# Patient Record
Sex: Male | Born: 1935
Health system: Southern US, Community
[De-identification: ages and names within clinical notes are randomized; demographics above are authoritative.]

## PROBLEM LIST (undated history)

## (undated) DIAGNOSIS — F0390 Unspecified dementia without behavioral disturbance: Secondary | ICD-10-CM

## (undated) DIAGNOSIS — I6529 Occlusion and stenosis of unspecified carotid artery: Secondary | ICD-10-CM

## (undated) DIAGNOSIS — E7800 Pure hypercholesterolemia, unspecified: Secondary | ICD-10-CM

## (undated) DIAGNOSIS — I10 Essential (primary) hypertension: Secondary | ICD-10-CM

## (undated) DIAGNOSIS — I639 Cerebral infarction, unspecified: Secondary | ICD-10-CM

## (undated) DIAGNOSIS — C801 Malignant (primary) neoplasm, unspecified: Secondary | ICD-10-CM

## (undated) DIAGNOSIS — N3941 Urge incontinence: Secondary | ICD-10-CM

## (undated) HISTORY — DX: Cerebral infarction, unspecified: I63.9

## (undated) HISTORY — DX: Occlusion and stenosis of unspecified carotid artery: I65.29

## (undated) HISTORY — DX: Unspecified dementia, unspecified severity, without behavioral disturbance, psychotic disturbance, mood disturbance, and anxiety: F03.90

## (undated) HISTORY — DX: Malignant (primary) neoplasm, unspecified: C80.1

---

## 1998-02-25 ENCOUNTER — Ambulatory Visit (HOSPITAL_COMMUNITY): Admission: RE | Admit: 1998-02-25 | Discharge: 1998-02-25 | Payer: Self-pay | Admitting: Surgery

## 1999-03-10 ENCOUNTER — Ambulatory Visit (HOSPITAL_COMMUNITY): Admission: RE | Admit: 1999-03-10 | Discharge: 1999-03-10 | Payer: Self-pay | Admitting: Surgery

## 2000-02-29 ENCOUNTER — Ambulatory Visit (HOSPITAL_COMMUNITY): Admission: RE | Admit: 2000-02-29 | Discharge: 2000-02-29 | Payer: Self-pay | Admitting: Surgery

## 2001-02-26 ENCOUNTER — Ambulatory Visit (HOSPITAL_COMMUNITY): Admission: RE | Admit: 2001-02-26 | Discharge: 2001-02-26 | Payer: Self-pay | Admitting: Surgery

## 2001-03-14 DIAGNOSIS — I639 Cerebral infarction, unspecified: Secondary | ICD-10-CM

## 2001-03-14 HISTORY — DX: Cerebral infarction, unspecified: I63.9

## 2002-01-15 ENCOUNTER — Other Ambulatory Visit: Admission: RE | Admit: 2002-01-15 | Discharge: 2002-01-15 | Payer: Self-pay | Admitting: Urology

## 2003-03-25 ENCOUNTER — Other Ambulatory Visit: Admission: RE | Admit: 2003-03-25 | Discharge: 2003-03-25 | Payer: Self-pay | Admitting: Urology

## 2003-04-22 ENCOUNTER — Ambulatory Visit (HOSPITAL_COMMUNITY): Admission: RE | Admit: 2003-04-22 | Discharge: 2003-04-22 | Payer: Self-pay | Admitting: Surgery

## 2003-05-01 ENCOUNTER — Ambulatory Visit: Admission: RE | Admit: 2003-05-01 | Discharge: 2003-07-30 | Payer: Self-pay | Admitting: Radiation Oncology

## 2005-02-18 ENCOUNTER — Emergency Department (HOSPITAL_COMMUNITY): Admission: EM | Admit: 2005-02-18 | Discharge: 2005-02-19 | Payer: Self-pay | Admitting: *Deleted

## 2007-03-15 DIAGNOSIS — C801 Malignant (primary) neoplasm, unspecified: Secondary | ICD-10-CM

## 2007-03-15 HISTORY — DX: Malignant (primary) neoplasm, unspecified: C80.1

## 2007-03-21 ENCOUNTER — Ambulatory Visit: Payer: Self-pay | Admitting: Vascular Surgery

## 2008-03-26 ENCOUNTER — Ambulatory Visit: Payer: Self-pay | Admitting: Vascular Surgery

## 2009-04-01 ENCOUNTER — Ambulatory Visit: Payer: Self-pay | Admitting: Vascular Surgery

## 2009-06-30 ENCOUNTER — Ambulatory Visit (HOSPITAL_COMMUNITY): Admission: RE | Admit: 2009-06-30 | Discharge: 2009-06-30 | Payer: Self-pay | Admitting: Pulmonary Disease

## 2009-07-14 ENCOUNTER — Encounter (HOSPITAL_COMMUNITY): Admission: RE | Admit: 2009-07-14 | Discharge: 2009-08-13 | Payer: Self-pay | Admitting: Pulmonary Disease

## 2010-04-01 ENCOUNTER — Ambulatory Visit: Admit: 2010-04-01 | Payer: Self-pay | Admitting: Vascular Surgery

## 2010-07-27 NOTE — Assessment & Plan Note (Signed)
OFFICE VISIT   Eduardo Vasquez, Eduardo Vasquez  DOB:  01/09/1930                                       03/21/2007  UUVOZ#:[id number]   Patient returns for followup today for mild carotid stenosis.  He was  last seen in January, 2008.  He previously had a left eye field cut, but  the symptoms have been unchanged now for greater than two years.  He  denies any other new symptoms of TIA, stroke, or amaurosis fugax.  His  atherosclerotic risk factors continue to remain hypertension and  elevated cholesterol.  He is on amlodipine, doxazosin, and simvastatin  to control these.  He has his cholesterol checked every six months.  He  takes his blood pressure daily in both arms.  He states that usually his  systolic blood pressure runs in the 130s.   PHYSICAL EXAMINATION:  Blood pressure is 162/82 in the right arm.  Pulse  is 98 and regular.  He has 2+ carotid pulses with no bruit.  Chest is  clear to auscultation.   Patient continues to take 1 aspirin daily.  He has had no problems with  this.  He will follow up again with me in one year's time with a repeat  carotid duplex at that time.  If he has had no progression of his  carotid stenosis, we will continue to follow him with a duplex scan  every other year and an office visit yearly.   Janetta Hora. Fields, MD  Electronically Signed   CEF/MEDQ  D:  03/21/2007  T:  03/22/2007  Job:  [id number]   cc:   Ramon Dredge L. Juanetta Gosling, M.D.

## 2010-07-27 NOTE — Procedures (Signed)
CAROTID DUPLEX EXAM   INDICATION:  Followup carotid artery disease.   HISTORY:  Diabetes:  No.  Cardiac:  No.  Hypertension:  Yes.  Smoking:  No.  Previous Surgery:  No.  CV History:  Stroke approximately 10 years ago with left eye upper  field cut which still persists today.  Paresthesias No, Hemiparesis No                                       RIGHT             LEFT  Brachial systolic pressure:         130               132  Brachial Doppler waveforms:         WNL               WNL  Vertebral direction of flow:        Antegrade         Antegrade  DUPLEX VELOCITIES (cm/sec)  CCA peak systolic                   119               140  ECA peak systolic                   165               148  ICA peak systolic                   87                103  ICA end diastolic                   25                19  PLAQUE MORPHOLOGY:                  Calcific          Calcific  PLAQUE AMOUNT:                      Mild              Mild  PLAQUE LOCATION:                    ICA / bifurcation ICA / bifurcation   IMPRESSION:  1. Bilateral internal carotid arteries show evidence of 1%-39%      stenosis.  2. No significant changes from previous study.         ___________________________________________  Janetta Hora Fields, MD   AS/MEDQ  D:  04/01/2009  T:  04/01/2009  Job:  [id number]

## 2010-07-27 NOTE — Procedures (Signed)
CAROTID DUPLEX EXAM   INDICATION:  Follow-up evaluation of known carotid artery disease.   HISTORY:  Diabetes:  No.  Cardiac:  No.  Hypertension:  Medically controlled.  Smoking:  No.  Previous Surgery:  No.  CV History:  Patient had a stroke characterized by left eye amaurosis  fugax.  Previous duplex on 03/24/06 revealed 20-39% ICA stenosis  bilaterally.  Amaurosis Fugax Yes, Paresthesias No, Hemiparesis No.                                       RIGHT             LEFT  Brachial systolic pressure:         148               148  Brachial Doppler waveforms:         Triphasic         Triphasic  Vertebral direction of flow:        Antegrade         Antegrade  DUPLEX VELOCITIES (cm/sec)  CCA peak systolic                   104               89  ECA peak systolic                   101               127  ICA peak systolic                   61                115  ICA end diastolic                   24                37  PLAQUE MORPHOLOGY:                  Mixed             Mixed  PLAQUE AMOUNT:                      Mild              Mild  PLAQUE LOCATION:                    Proximal ICA      Proximal ICA   IMPRESSION:  1. 20-39% internal carotid artery stenosis bilaterally.  2. No significant change from previous study performed on 03/24/06.   ___________________________________________  Janetta Hora. Fields, MD   MC/MEDQ  D:  03/26/2008  T:  03/26/2008  Job:  [id number]

## 2011-04-05 DIAGNOSIS — D075 Carcinoma in situ of prostate: Secondary | ICD-10-CM | POA: Diagnosis not present

## 2011-04-05 DIAGNOSIS — I10 Essential (primary) hypertension: Secondary | ICD-10-CM | POA: Diagnosis not present

## 2011-04-05 DIAGNOSIS — E785 Hyperlipidemia, unspecified: Secondary | ICD-10-CM | POA: Diagnosis not present

## 2011-04-20 DIAGNOSIS — H4011X0 Primary open-angle glaucoma, stage unspecified: Secondary | ICD-10-CM | POA: Diagnosis not present

## 2011-05-13 DIAGNOSIS — E785 Hyperlipidemia, unspecified: Secondary | ICD-10-CM | POA: Diagnosis not present

## 2011-05-23 ENCOUNTER — Other Ambulatory Visit (HOSPITAL_COMMUNITY): Payer: Self-pay | Admitting: Pulmonary Disease

## 2011-05-23 DIAGNOSIS — I10 Essential (primary) hypertension: Secondary | ICD-10-CM | POA: Diagnosis not present

## 2011-05-23 DIAGNOSIS — N189 Chronic kidney disease, unspecified: Secondary | ICD-10-CM

## 2011-05-23 DIAGNOSIS — E785 Hyperlipidemia, unspecified: Secondary | ICD-10-CM | POA: Diagnosis not present

## 2011-05-23 DIAGNOSIS — N19 Unspecified kidney failure: Secondary | ICD-10-CM | POA: Diagnosis not present

## 2011-05-24 ENCOUNTER — Ambulatory Visit (HOSPITAL_COMMUNITY)
Admission: RE | Admit: 2011-05-24 | Discharge: 2011-05-24 | Disposition: A | Discharge status: Home / Self Care | Payer: Medicare Other | Source: Ambulatory Visit | Attending: Pulmonary Disease | Admitting: Pulmonary Disease

## 2011-05-24 DIAGNOSIS — Q619 Cystic kidney disease, unspecified: Secondary | ICD-10-CM | POA: Insufficient documentation

## 2011-05-24 DIAGNOSIS — N400 Enlarged prostate without lower urinary tract symptoms: Secondary | ICD-10-CM | POA: Insufficient documentation

## 2011-05-24 DIAGNOSIS — N189 Chronic kidney disease, unspecified: Secondary | ICD-10-CM | POA: Insufficient documentation

## 2011-05-24 DIAGNOSIS — N281 Cyst of kidney, acquired: Secondary | ICD-10-CM | POA: Diagnosis not present

## 2011-06-14 DIAGNOSIS — I10 Essential (primary) hypertension: Secondary | ICD-10-CM | POA: Diagnosis not present

## 2011-06-14 DIAGNOSIS — E785 Hyperlipidemia, unspecified: Secondary | ICD-10-CM | POA: Diagnosis not present

## 2011-06-14 DIAGNOSIS — I6789 Other cerebrovascular disease: Secondary | ICD-10-CM | POA: Diagnosis not present

## 2011-06-14 DIAGNOSIS — R05 Cough: Secondary | ICD-10-CM | POA: Diagnosis not present

## 2011-07-25 DIAGNOSIS — H4011X0 Primary open-angle glaucoma, stage unspecified: Secondary | ICD-10-CM | POA: Diagnosis not present

## 2011-08-05 DIAGNOSIS — E785 Hyperlipidemia, unspecified: Secondary | ICD-10-CM | POA: Diagnosis not present

## 2011-08-05 DIAGNOSIS — I10 Essential (primary) hypertension: Secondary | ICD-10-CM | POA: Diagnosis not present

## 2011-08-15 DIAGNOSIS — I6789 Other cerebrovascular disease: Secondary | ICD-10-CM | POA: Diagnosis not present

## 2011-08-15 DIAGNOSIS — I10 Essential (primary) hypertension: Secondary | ICD-10-CM | POA: Diagnosis not present

## 2011-08-15 DIAGNOSIS — E785 Hyperlipidemia, unspecified: Secondary | ICD-10-CM | POA: Diagnosis not present

## 2011-08-15 DIAGNOSIS — R05 Cough: Secondary | ICD-10-CM | POA: Diagnosis not present

## 2011-10-05 ENCOUNTER — Encounter: Payer: Self-pay | Admitting: Vascular Surgery

## 2011-10-26 DIAGNOSIS — H4011X0 Primary open-angle glaucoma, stage unspecified: Secondary | ICD-10-CM | POA: Diagnosis not present

## 2011-10-28 ENCOUNTER — Other Ambulatory Visit: Payer: Self-pay | Admitting: *Deleted

## 2011-10-28 DIAGNOSIS — I6529 Occlusion and stenosis of unspecified carotid artery: Secondary | ICD-10-CM

## 2011-11-02 ENCOUNTER — Encounter: Payer: Self-pay | Admitting: Neurosurgery

## 2011-11-03 ENCOUNTER — Other Ambulatory Visit (INDEPENDENT_AMBULATORY_CARE_PROVIDER_SITE_OTHER): Payer: Medicare Other | Admitting: *Deleted

## 2011-11-03 ENCOUNTER — Encounter: Payer: Self-pay | Admitting: Neurosurgery

## 2011-11-03 ENCOUNTER — Ambulatory Visit (INDEPENDENT_AMBULATORY_CARE_PROVIDER_SITE_OTHER): Payer: Medicare Other | Admitting: Neurosurgery

## 2011-11-03 VITALS — BP 127/68 | HR 70 | Resp 14 | Ht 73.0 in | Wt 178.1 lb

## 2011-11-03 DIAGNOSIS — I6529 Occlusion and stenosis of unspecified carotid artery: Secondary | ICD-10-CM | POA: Insufficient documentation

## 2011-11-03 NOTE — Progress Notes (Signed)
[facility] [place] Carotid Office Note  CC: Annual carotid surveillance Referring Physician: Fields  History of Present Illness: 78 year old male patient of Dr. Darrick Penna followed for mild bilateral carotid stenosis. The patient denies signs or symptoms of CVA, TIA, amaurosis fugax or any neural deficit. The patient denies any new medical diagnoses or recent surgery.  No past medical history on file.  ROS: [x]  Positive   [ ]  Denies    General: [ ]  Weight loss, [ ]  Fever, [ ]  chills Neurologic: [ ]  Dizziness, [ ]  Blackouts, [ ]  Seizure [ ]  Stroke, [ ]  "Mini stroke", [ ]  Slurred speech, [ ]  Temporary blindness; [ ]  weakness in arms or legs, [ ]  Hoarseness Cardiac: [ ]  Chest pain/pressure, [ ]  Shortness of breath at rest [ ]  Shortness of breath with exertion, [ ]  Atrial fibrillation or irregular heartbeat Vascular: [ ]  Pain in legs with walking, [ ]  Pain in legs at rest, [ ]  Pain in legs at night,  [ ]  Non-healing ulcer, [ ]  Blood clot in vein/DVT,   Pulmonary: [ ]  Home oxygen, [ ]  Productive cough, [ ]  Coughing up blood, [ ]  Asthma,  [ ]  Wheezing Musculoskeletal:  [ ]  Arthritis, [ ]  Low back pain, [ ]  Joint pain Hematologic: [ ]  Easy Bruising, [ ]  Anemia; [ ]  Hepatitis Gastrointestinal: [ ]  Blood in stool, [ ]  Gastroesophageal Reflux/heartburn, [ ]  Trouble swallowing Urinary: [ ]  chronic Kidney disease, [ ]  on HD - [ ]  MWF or [ ]  TTHS, [ ]  Burning with urination, [ ]  Difficulty urinating Skin: [ ]  Rashes, [ ]  Wounds Psychological: [ ]  Anxiety, [ ]  Depression   Social History History  Substance Use Topics  . Smoking status: Never Smoker   . Smokeless tobacco: Not on file  . Alcohol Use: Yes    Family History No family history on file.  Allergies not on file  No current outpatient prescriptions on file.    Physical Examination  Filed Vitals:   11/03/11 1435  BP: 130/70  Pulse: 72  Resp: 14    Body mass index is 23.50 kg/(m^2).  General:  WDWN in  NAD Gait: Normal HEENT: WNL Eyes: Pupils equal Pulmonary: normal non-labored breathing , without Rales, rhonchi,  wheezing Cardiac: RRR, without  Murmurs, rubs or gallops; Abdomen: soft, NT, no masses Skin: no rashes, ulcers noted  Vascular Exam Pulses: 3+ radial pulses bilaterally Carotid bruits: Carotid pulses to auscultation no bruits are heard Extremities without ischemic changes, no Gangrene , no cellulitis; no open wounds;  Musculoskeletal: no muscle wasting or atrophy   Neurologic: A&O X 3; Appropriate Affect ; SENSATION: normal; MOTOR FUNCTION:  moving all extremities equally. Speech is fluent/normal  Non-Invasive Vascular Imaging CAROTID DUPLEX 11/03/2011  Right ICA 20 - 39 % stenosis Left ICA 20 - 39 % stenosis   ASSESSMENT/PLAN: Asymptomatic patient with mild bilateral carotid stenosis unchanged from previous exam. This will followup in one year with repeat carotid duplex. The patient's questions were encouraged and answered, he is in agreement with this plan.  Lauree Chandler ANP   Clinic MD: Darrick Penna

## 2011-11-04 ENCOUNTER — Encounter: Payer: Self-pay | Admitting: Neurosurgery

## 2011-12-08 DIAGNOSIS — Z23 Encounter for immunization: Secondary | ICD-10-CM | POA: Diagnosis not present

## 2011-12-19 DIAGNOSIS — R05 Cough: Secondary | ICD-10-CM | POA: Diagnosis not present

## 2011-12-19 DIAGNOSIS — I10 Essential (primary) hypertension: Secondary | ICD-10-CM | POA: Diagnosis not present

## 2011-12-19 DIAGNOSIS — E785 Hyperlipidemia, unspecified: Secondary | ICD-10-CM | POA: Diagnosis not present

## 2011-12-19 DIAGNOSIS — N19 Unspecified kidney failure: Secondary | ICD-10-CM | POA: Diagnosis not present

## 2011-12-20 DIAGNOSIS — N19 Unspecified kidney failure: Secondary | ICD-10-CM | POA: Diagnosis not present

## 2011-12-20 DIAGNOSIS — I10 Essential (primary) hypertension: Secondary | ICD-10-CM | POA: Diagnosis not present

## 2011-12-20 DIAGNOSIS — I739 Peripheral vascular disease, unspecified: Secondary | ICD-10-CM | POA: Diagnosis not present

## 2011-12-20 DIAGNOSIS — E785 Hyperlipidemia, unspecified: Secondary | ICD-10-CM | POA: Diagnosis not present

## 2011-12-28 ENCOUNTER — Other Ambulatory Visit: Payer: Self-pay | Admitting: *Deleted

## 2011-12-28 DIAGNOSIS — I6529 Occlusion and stenosis of unspecified carotid artery: Secondary | ICD-10-CM

## 2012-01-18 DIAGNOSIS — C61 Malignant neoplasm of prostate: Secondary | ICD-10-CM | POA: Diagnosis not present

## 2012-01-25 DIAGNOSIS — C61 Malignant neoplasm of prostate: Secondary | ICD-10-CM | POA: Diagnosis not present

## 2012-02-22 DIAGNOSIS — H2510 Age-related nuclear cataract, unspecified eye: Secondary | ICD-10-CM | POA: Diagnosis not present

## 2012-02-22 DIAGNOSIS — H4011X0 Primary open-angle glaucoma, stage unspecified: Secondary | ICD-10-CM | POA: Diagnosis not present

## 2012-03-23 IMAGING — US US RENAL
1 series · 14 of 25 positions shown · non-contrast
Comparison: Abdomen pelvis CT 02/19/2005

CLINICAL DATA: Chronic renal failure.

RENAL/URINARY TRACT ULTRASOUND COMPLETE

[Series 1: us renal · 0.26mm/px · 14 of 30 slices shown]
[im 1/30]
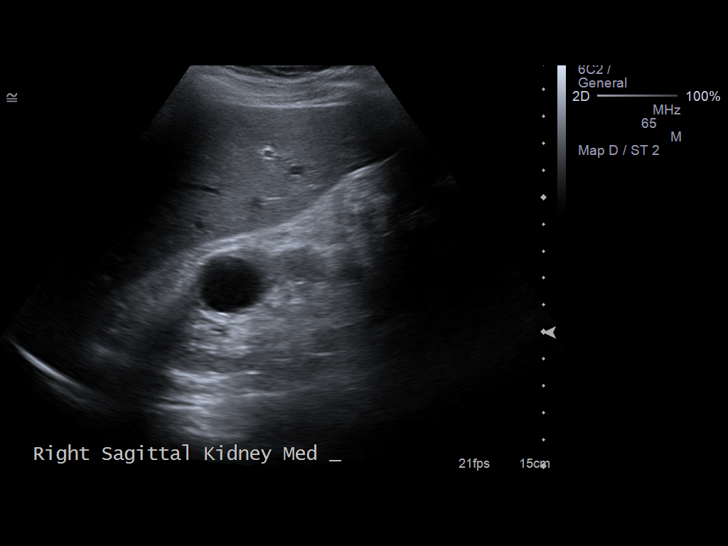
[im 3/30]
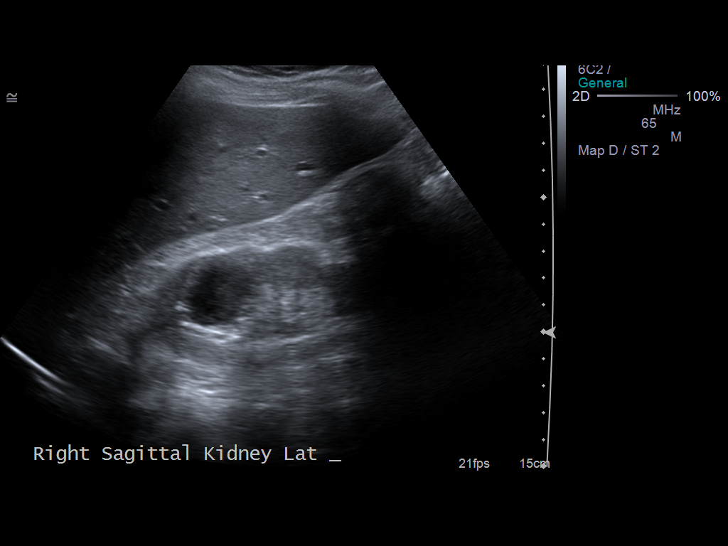
[im 5/30]
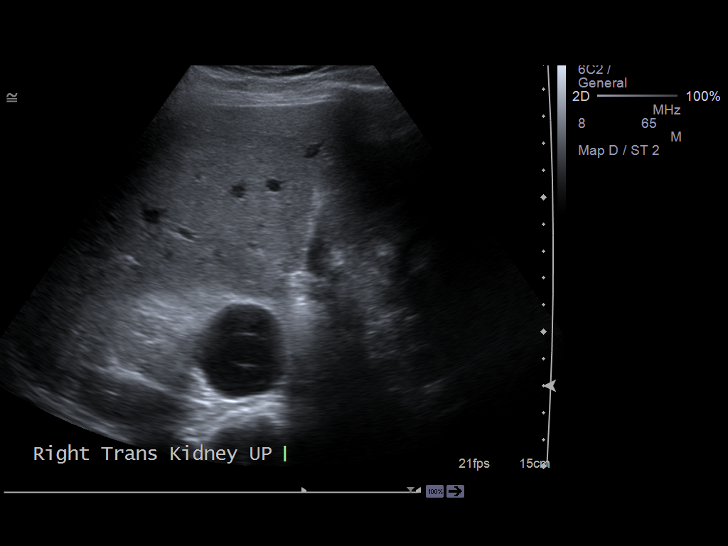
[im 8/30]
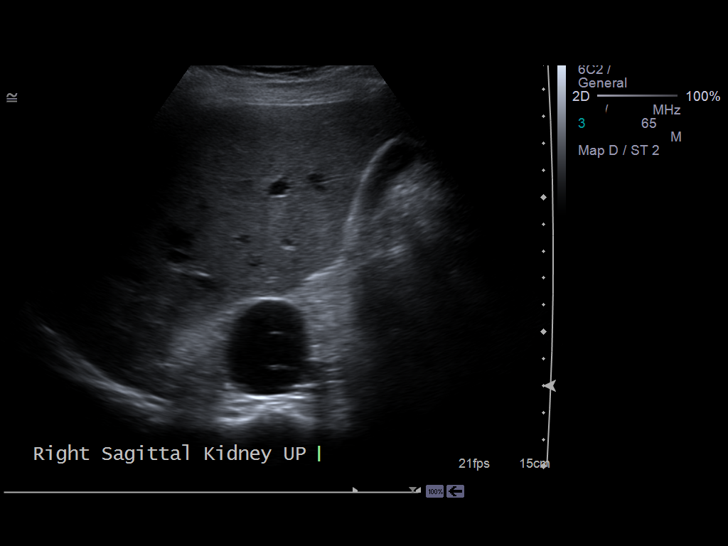
[im 10/30]
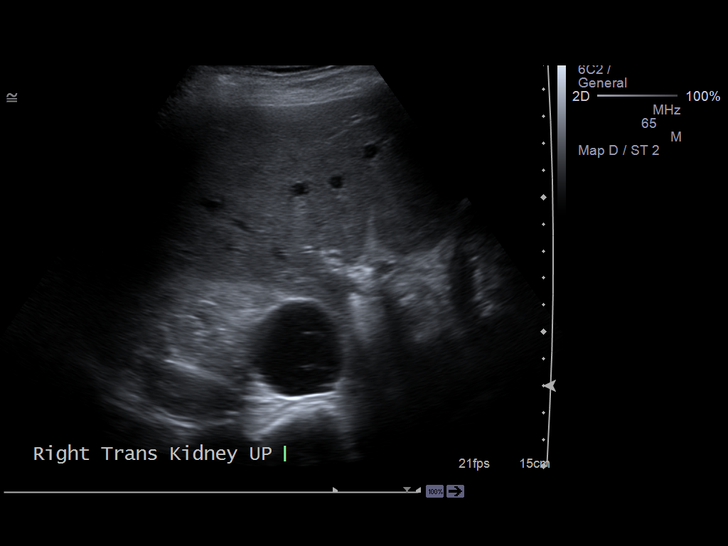
[im 11/30]
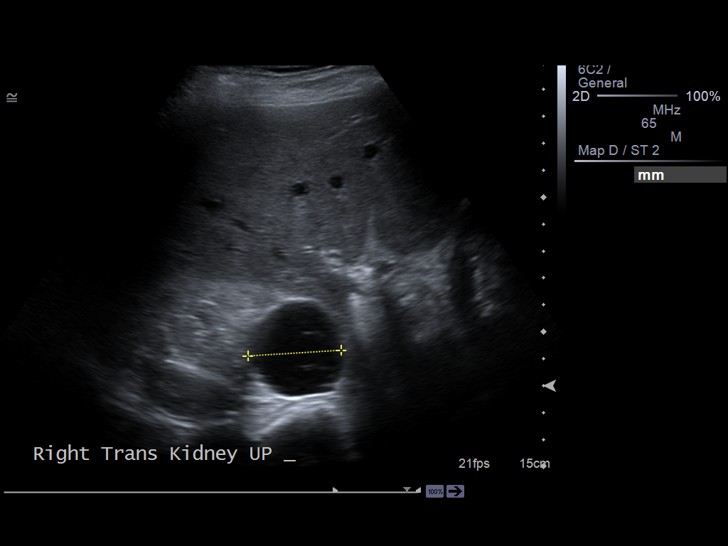
[im 14/30]
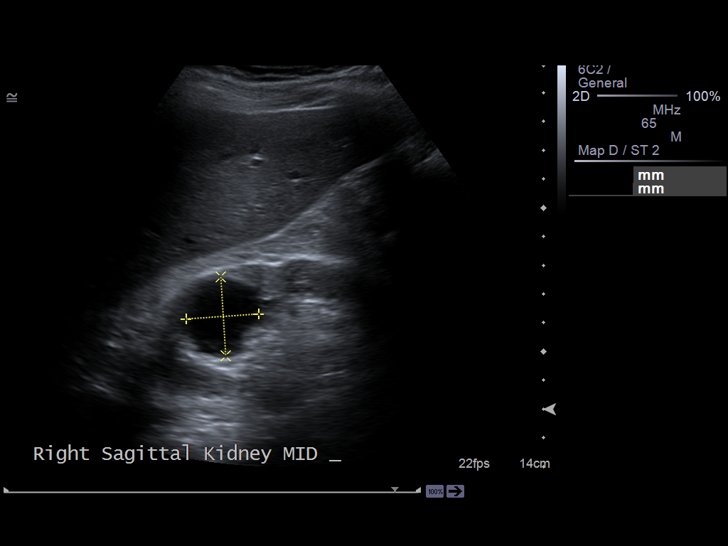
[im 16/30]
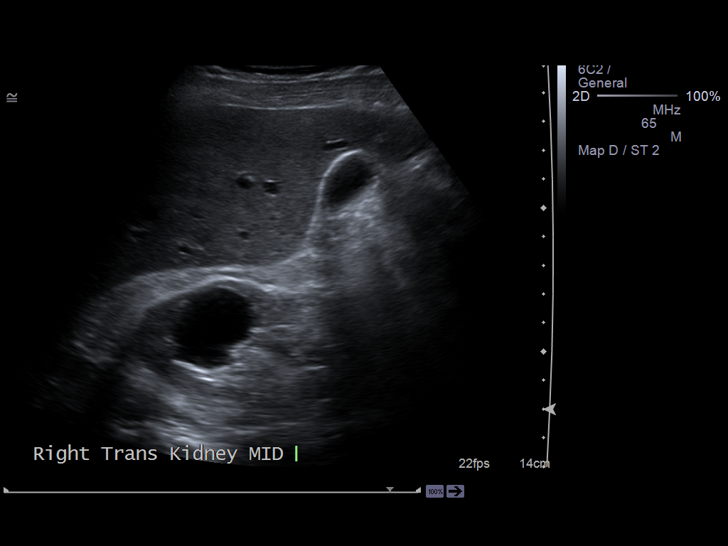
[im 19/30]
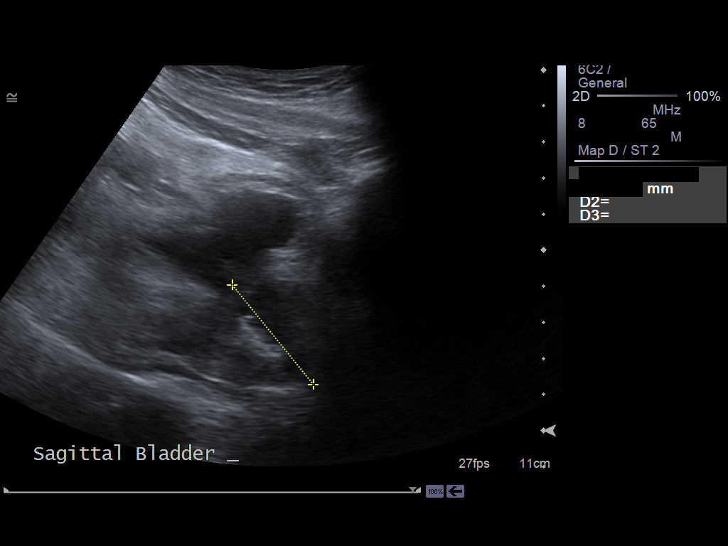
[im 20/30]
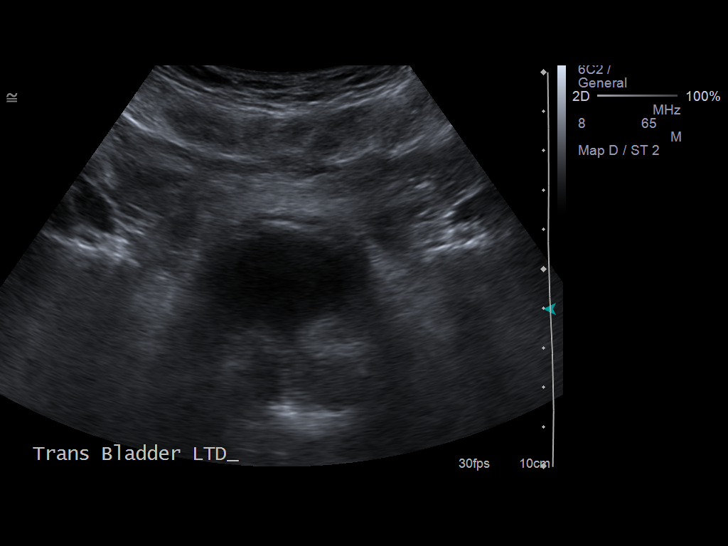
[im 22/30]
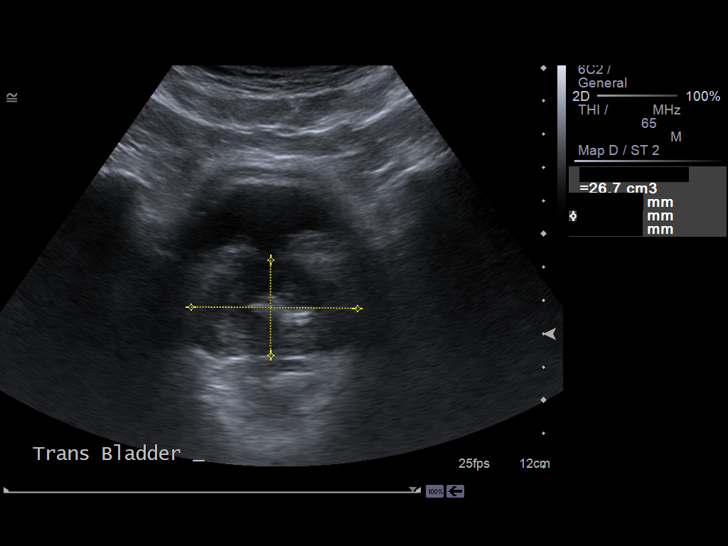
[im 25/30]
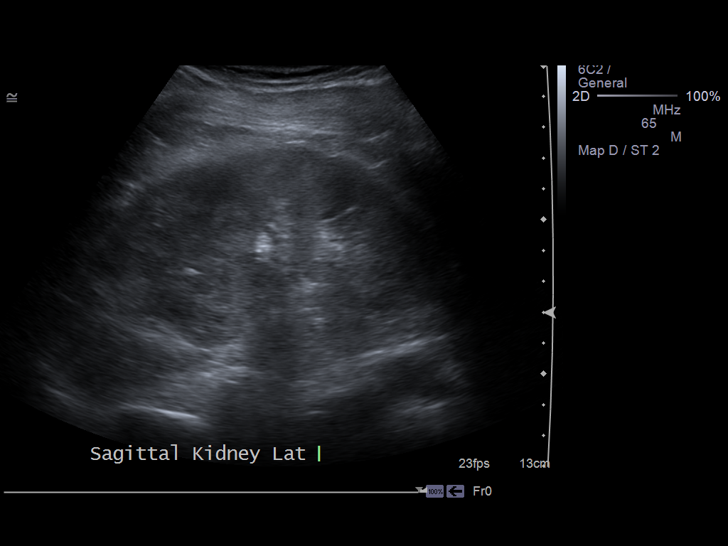
[im 27/30]
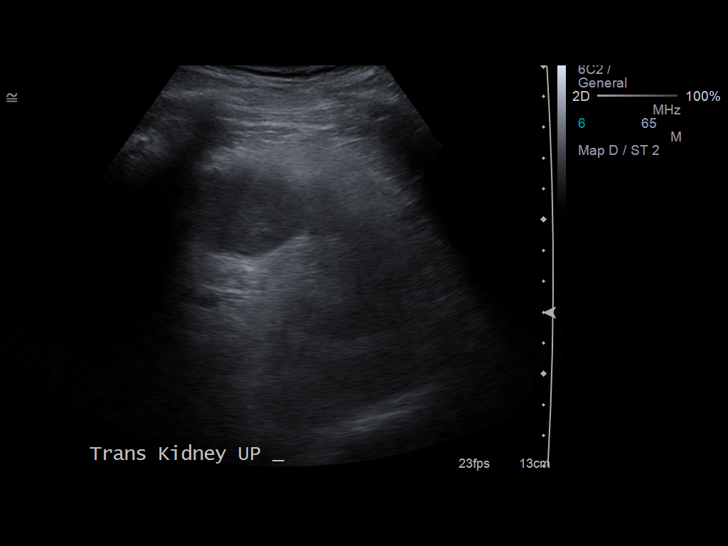
[im 30/30]
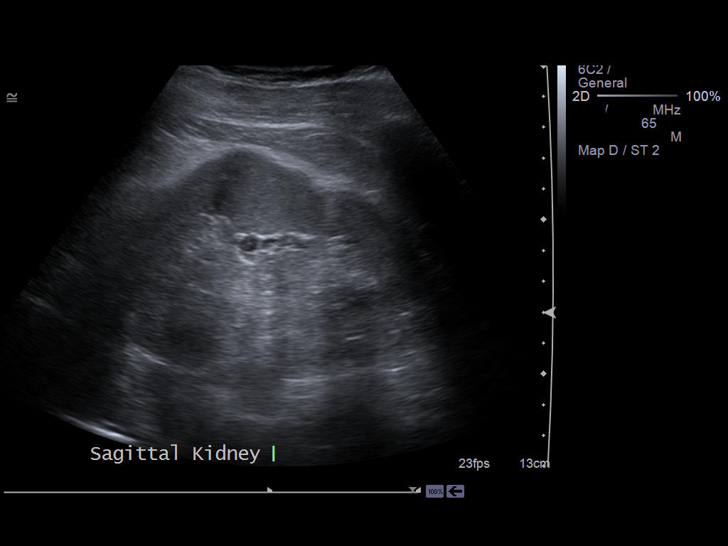

[14 of 25 positions shown; findings below may reference images not displayed]

FINDINGS: Right Kidney:  Measures 10.3 cm in length.  Echogenicity of the
right kidney is slightly increased compared to the adjacent liver.
There are two simple/benign cysts identified in the right kidney.
Extending from the upper pole is a 3.0 x 3.5 x 3.4 cm cyst.  This
contains two very thin incomplete septations centrally.  In the mid
pole right kidney is a 2.6 x 2.5 x 2.8 cm simple cyst.  There is no
hydronephrosis.

Left Kidney:  Measures 11.7 cm in length.  Echogenicity of the
renal cortex appears slightly increased.

Bladder:  Urinary bladder is nearly completely decompressed,
limiting its evaluation.

Prostate gland: Appears prominent by ultrasound.  Estimated to
measure 3.6 x 2.9 x 5.0 cm (calculated volume of 27 cm cubed).
IMPRESSION: 1.  Mildly increased echogenicity of both kidneys.  This can be
seen with chronic medical renal disease.
2.  Two simple/benign cysts in the right kidney.  No imaging follow-
up is felt to be necessary.
3.  Negative for hydronephrosis.
4.  Prostamegaly.

## 2012-06-14 DIAGNOSIS — N19 Unspecified kidney failure: Secondary | ICD-10-CM | POA: Diagnosis not present

## 2012-06-14 DIAGNOSIS — I10 Essential (primary) hypertension: Secondary | ICD-10-CM | POA: Diagnosis not present

## 2012-06-14 DIAGNOSIS — H4011X0 Primary open-angle glaucoma, stage unspecified: Secondary | ICD-10-CM | POA: Diagnosis not present

## 2012-06-14 DIAGNOSIS — E785 Hyperlipidemia, unspecified: Secondary | ICD-10-CM | POA: Diagnosis not present

## 2012-06-14 DIAGNOSIS — I6789 Other cerebrovascular disease: Secondary | ICD-10-CM | POA: Diagnosis not present

## 2012-08-01 DIAGNOSIS — C61 Malignant neoplasm of prostate: Secondary | ICD-10-CM | POA: Diagnosis not present

## 2012-09-11 DIAGNOSIS — C61 Malignant neoplasm of prostate: Secondary | ICD-10-CM | POA: Diagnosis not present

## 2012-10-18 DIAGNOSIS — H4011X0 Primary open-angle glaucoma, stage unspecified: Secondary | ICD-10-CM | POA: Diagnosis not present

## 2012-10-31 ENCOUNTER — Encounter: Payer: Self-pay | Admitting: Family

## 2012-11-01 ENCOUNTER — Encounter: Payer: Self-pay | Admitting: Family

## 2012-11-01 ENCOUNTER — Other Ambulatory Visit (INDEPENDENT_AMBULATORY_CARE_PROVIDER_SITE_OTHER): Payer: Medicare Other | Admitting: *Deleted

## 2012-11-01 ENCOUNTER — Ambulatory Visit (INDEPENDENT_AMBULATORY_CARE_PROVIDER_SITE_OTHER): Payer: Medicare Other | Admitting: Family

## 2012-11-01 DIAGNOSIS — I6529 Occlusion and stenosis of unspecified carotid artery: Secondary | ICD-10-CM

## 2012-11-01 NOTE — Progress Notes (Signed)
Established Carotid Patient   History of Present Illness  Eduardo Vasquez is a 79 y.o. male who has known carotid stenosis with a history of stroke in 2003, his only symptom was left eye partial upper visual field loss and this deficit remains.   Patient has not had previous CEA.   The patient  denies facial drooping.  Pt. denies hemiplegia.  The patient denies receptive or expressive aphasia.  Pt. denies weakness.  The patient's previous neurologic deficits are Unchanged.  Patient denies new Medical or Surgical History.  Pt Diabetic: No Pt smoker: No  Pt meds include: Statin : Yes Betablocker: No ASA: Yes Other anticoagulants/antiplatelets: no   Past Medical History  Diagnosis Date  . Carotid artery occlusion   . Cancer 2009    Prostate  . Stroke 2003    Mini - Eye    Social History History  Substance Use Topics  . Smoking status: Never Smoker   . Smokeless tobacco: Never Used  . Alcohol Use: Yes    Family History Family History  Problem Relation Age of Onset  . Hypertension Mother   . Hypertension Father   . Diabetes Daughter     Surgical History History reviewed. No pertinent past surgical history.  Not on File  Current Outpatient Prescriptions  Medication Sig Dispense Refill  . AMLODIPINE BESYLATE PO Take 10 mg by mouth daily.      Marland Kitchen aspirin 325 MG tablet Take 325 mg by mouth daily.      Marland Kitchen doxazosin (CARDURA) 8 MG tablet Take 8 mg by mouth at bedtime.      . Multiple Vitamin (MULTIVITAMIN) tablet Take 1 tablet by mouth daily.      . pravastatin (PRAVACHOL) 80 MG tablet Take 80 mg by mouth daily.      . Tamsulosin HCl (FLOMAX) 0.4 MG CAPS Take 0.4 mg by mouth daily.       No current facility-administered medications for this visit.    Review of Systems : [x]  Positive   [ ]  Denies  General:[ ]  Weight loss,  [ ]  Weight gain, [ ]  Loss of appetite, [ ]  Fever, [ ]  chills  Neurologic: [ ]  Dizziness, [ ]  Blackouts, [ ]  Headaches, [ ]  Seizure [ ]   Stroke, [ ]  "Mini stroke", [ ]  Slurred speech, [ ]  Temporary blindness;  [ ] weakness,  Ear/Nose/Throat: [ ]  Change in hearing, [ ]  Nose bleeds, [ ]  Hoarseness  Vascular:[ ]  Pain in legs with walking, [ ]  Pain in feet while lying flat , [ ]   Non-healing ulcer, [ ]  Blood clot in vein,    Pulmonary: [ ]  Home oxygen, [ ]   Productive cough, [ ]  Bronchitis, [ ]  Coughing up blood,  [ ]  Asthma, [ ]  Wheezing  Musculoskeletal:  [ ]  Arthritis, [ ]  Joint pain, [ ]  low back pain  Cardiac: [ ]  Chest pain, [ ]  Shortness of breath when lying flat, [ ]  Shortness of breath with exertion, [ ]  Palpitations, [ ]  Heart murmur, [ ]   Atrial fibrillation  Hematologic:[ ]  Easy Bruising, [ ]  Anemia; [ ]  Hepatitis  Psychiatric: [ ]   Depression, [ ]  Anxiety   Gastrointestinal: [ ]  Black stool, [ ]  Blood in stool, [ ]  Peptic ulcer disease,  [ ]  Gastroesophageal Reflux, [ ]  Trouble swallowing, [ ]  Diarrhea, [ ]  Constipation  Urinary: [ ]  chronic Kidney disease, [ ]  on HD, [ ]  Burning with urination, [ ]  Frequent urination, [ ]  Difficulty urinating;  Skin: [ ]  Rashes, [ ]  Wounds    Physical Examination  Filed Vitals:   11/01/12 1630  BP: 160/82  Pulse: 62  Resp:   Body mass index is 22.83 kg/(m^2).   General: WDWN male in NAD GAIT: normal Eyes: PERRLA Pulmonary:  CTAB, Negative  Rales, Negative rhonchi, & Negative wheezing,  Cardiac: regular Rhythm , no peripheral edema  Vascular:      Vessel Right Left  Radial Palpable Palpable  Carotid audible, without bruit audible, without bruit  Aorta Not palpable N/A  Popliteal  palpable  palpable  PT Palpable Palpable  DP Palpable Palpable    Gastrointestinal: soft, nontender, BS WNL, no r/g,  negative masses;   Musculoskeletal: Negative muscle atrophy/wasting. M/S 5/5 throughout, Extremities without ischemic changes.  Neurologic: A&O X 3; Appropriate Affect ; SENSATION ;normal; MOTOR FUNCTION: normal 5/5 strength in all tested muscle groups. Speech is  normal CN 2-12 intact, Pain and light touch intact in extremities, Motor exam as listed above .  Non-Invasive Vascular Imaging CAROTID DUPLEX 11/01/2012   Less than 40% stenosis ICA bilaterally, unchanged from previous Duplex.  Assessment: Eduardo Vasquez is a 79 y.o. male who remains asymptomatic since his 2003 stroke that affected only the upper visual field in his left eye and this visual field loss remains. His carotid Duplex results remain the same as last year: less than 40% internal carotid stenosis bilaterally.  Plan: Follow-up in 1 year with Carotid Duplex scan.   I discussed in depth with the patient the nature of atherosclerosis, and emphasized the importance of maximal medical management including strict control of blood pressure, blood glucose, and lipid levels, obtaining regular exercise, and cessation of smoking.  The patient is aware that without maximal medical management the underlying atherosclerotic disease process will progress, limiting the benefit of any interventions. Pt was given verbal information regarding stroke symptoms and prevention. He knows to return sooner if needed. Thank you for allowing [place] to participate in this patient's care.  Charisse March, RN, MSN, FNP-C [facility] [place] Office: [phone]  Clinic Physician: Darrick Penna  11/01/2012 4:55 PM

## 2012-11-22 ENCOUNTER — Other Ambulatory Visit: Payer: Self-pay | Admitting: *Deleted

## 2012-11-28 DIAGNOSIS — Z23 Encounter for immunization: Secondary | ICD-10-CM | POA: Diagnosis not present

## 2012-12-25 DIAGNOSIS — N19 Unspecified kidney failure: Secondary | ICD-10-CM | POA: Diagnosis not present

## 2012-12-25 DIAGNOSIS — E785 Hyperlipidemia, unspecified: Secondary | ICD-10-CM | POA: Diagnosis not present

## 2012-12-25 DIAGNOSIS — I10 Essential (primary) hypertension: Secondary | ICD-10-CM | POA: Diagnosis not present

## 2012-12-25 DIAGNOSIS — I6789 Other cerebrovascular disease: Secondary | ICD-10-CM | POA: Diagnosis not present

## 2012-12-26 DIAGNOSIS — E785 Hyperlipidemia, unspecified: Secondary | ICD-10-CM | POA: Diagnosis not present

## 2012-12-26 DIAGNOSIS — N19 Unspecified kidney failure: Secondary | ICD-10-CM | POA: Diagnosis not present

## 2012-12-26 DIAGNOSIS — F528 Other sexual dysfunction not due to a substance or known physiological condition: Secondary | ICD-10-CM | POA: Diagnosis not present

## 2012-12-26 DIAGNOSIS — I6789 Other cerebrovascular disease: Secondary | ICD-10-CM | POA: Diagnosis not present

## 2012-12-26 DIAGNOSIS — C61 Malignant neoplasm of prostate: Secondary | ICD-10-CM | POA: Diagnosis not present

## 2012-12-26 DIAGNOSIS — I10 Essential (primary) hypertension: Secondary | ICD-10-CM | POA: Diagnosis not present

## 2013-02-04 DIAGNOSIS — I10 Essential (primary) hypertension: Secondary | ICD-10-CM | POA: Diagnosis not present

## 2013-02-04 DIAGNOSIS — K4090 Unilateral inguinal hernia, without obstruction or gangrene, not specified as recurrent: Secondary | ICD-10-CM | POA: Diagnosis not present

## 2013-02-18 DIAGNOSIS — H4011X0 Primary open-angle glaucoma, stage unspecified: Secondary | ICD-10-CM | POA: Diagnosis not present

## 2013-02-18 DIAGNOSIS — H2510 Age-related nuclear cataract, unspecified eye: Secondary | ICD-10-CM | POA: Diagnosis not present

## 2013-05-02 ENCOUNTER — Other Ambulatory Visit: Payer: Self-pay | Admitting: Family

## 2013-05-02 DIAGNOSIS — I6529 Occlusion and stenosis of unspecified carotid artery: Secondary | ICD-10-CM

## 2013-06-12 DIAGNOSIS — I10 Essential (primary) hypertension: Secondary | ICD-10-CM | POA: Diagnosis not present

## 2013-06-12 DIAGNOSIS — I6789 Other cerebrovascular disease: Secondary | ICD-10-CM | POA: Diagnosis not present

## 2013-06-12 DIAGNOSIS — E785 Hyperlipidemia, unspecified: Secondary | ICD-10-CM | POA: Diagnosis not present

## 2013-06-12 DIAGNOSIS — N19 Unspecified kidney failure: Secondary | ICD-10-CM | POA: Diagnosis not present

## 2013-06-20 DIAGNOSIS — H4011X0 Primary open-angle glaucoma, stage unspecified: Secondary | ICD-10-CM | POA: Diagnosis not present

## 2013-06-20 DIAGNOSIS — H2510 Age-related nuclear cataract, unspecified eye: Secondary | ICD-10-CM | POA: Diagnosis not present

## 2013-07-02 DIAGNOSIS — M25519 Pain in unspecified shoulder: Secondary | ICD-10-CM | POA: Diagnosis not present

## 2013-07-02 DIAGNOSIS — M542 Cervicalgia: Secondary | ICD-10-CM | POA: Diagnosis not present

## 2013-07-09 ENCOUNTER — Ambulatory Visit (INDEPENDENT_AMBULATORY_CARE_PROVIDER_SITE_OTHER): Payer: Medicare Other | Admitting: Urology

## 2013-07-09 DIAGNOSIS — N529 Male erectile dysfunction, unspecified: Secondary | ICD-10-CM | POA: Diagnosis not present

## 2013-07-09 DIAGNOSIS — C61 Malignant neoplasm of prostate: Secondary | ICD-10-CM | POA: Diagnosis not present

## 2013-07-31 DIAGNOSIS — H4011X0 Primary open-angle glaucoma, stage unspecified: Secondary | ICD-10-CM | POA: Diagnosis not present

## 2013-09-03 DIAGNOSIS — H2589 Other age-related cataract: Secondary | ICD-10-CM | POA: Diagnosis not present

## 2013-09-03 DIAGNOSIS — H4011X0 Primary open-angle glaucoma, stage unspecified: Secondary | ICD-10-CM | POA: Diagnosis not present

## 2013-11-06 ENCOUNTER — Encounter: Payer: Self-pay | Admitting: Family

## 2013-11-07 ENCOUNTER — Other Ambulatory Visit: Payer: Medicare Other | Admitting: *Deleted

## 2013-11-07 ENCOUNTER — Ambulatory Visit (INDEPENDENT_AMBULATORY_CARE_PROVIDER_SITE_OTHER): Payer: Medicare Other | Admitting: Family

## 2013-11-07 ENCOUNTER — Encounter: Payer: Self-pay | Admitting: Family

## 2013-11-07 ENCOUNTER — Ambulatory Visit (HOSPITAL_COMMUNITY)
Admission: RE | Admit: 2013-11-07 | Discharge: 2013-11-07 | Disposition: A | Discharge status: Home / Self Care | Payer: Medicare Other | Source: Ambulatory Visit | Attending: Family | Admitting: Family

## 2013-11-07 VITALS — BP 140/81 | HR 58 | Resp 16 | Ht 73.0 in | Wt 177.0 lb

## 2013-11-07 DIAGNOSIS — I658 Occlusion and stenosis of other precerebral arteries: Secondary | ICD-10-CM | POA: Insufficient documentation

## 2013-11-07 DIAGNOSIS — I6529 Occlusion and stenosis of unspecified carotid artery: Secondary | ICD-10-CM | POA: Diagnosis not present

## 2013-11-07 NOTE — Patient Instructions (Signed)
Stroke Prevention Some medical conditions and behaviors are associated with an increased chance of having a stroke. You may prevent a stroke by making healthy choices and managing medical conditions. HOW CAN I REDUCE MY RISK OF HAVING A STROKE?   Stay physically active. Get at least 30 minutes of activity on most or all days.  Do not smoke. It may also be helpful to avoid exposure to secondhand smoke.  Limit alcohol use. Moderate alcohol use is considered to be:  No more than 2 drinks per day for men.  No more than 1 drink per day for nonpregnant women.  Eat healthy foods. This involves:  Eating 5 or more servings of fruits and vegetables a day.  Making dietary changes that address high blood pressure (hypertension), high cholesterol, diabetes, or obesity.  Manage your cholesterol levels.  Making food choices that are high in fiber and low in saturated fat, trans fat, and cholesterol may control cholesterol levels.  Take any prescribed medicines to control cholesterol as directed by your health care provider.  Manage your diabetes.  Controlling your carbohydrate and sugar intake is recommended to manage diabetes.  Take any prescribed medicines to control diabetes as directed by your health care provider.  Control your hypertension.  Making food choices that are low in salt (sodium), saturated fat, trans fat, and cholesterol is recommended to manage hypertension.  Take any prescribed medicines to control hypertension as directed by your health care provider.  Maintain a healthy weight.  Reducing calorie intake and making food choices that are low in sodium, saturated fat, trans fat, and cholesterol are recommended to manage weight.  Stop drug abuse.  Avoid taking birth control pills.  Talk to your health care provider about the risks of taking birth control pills if you are over 35 years old, smoke, get migraines, or have ever had a blood clot.  Get evaluated for sleep  disorders (sleep apnea).  Talk to your health care provider about getting a sleep evaluation if you snore a lot or have excessive sleepiness.  Take medicines only as directed by your health care provider.  For some people, aspirin or blood thinners (anticoagulants) are helpful in reducing the risk of forming abnormal blood clots that can lead to stroke. If you have the irregular heart rhythm of atrial fibrillation, you should be on a blood thinner unless there is a good reason you cannot take them.  Understand all your medicine instructions.  Make sure that other conditions (such as anemia or atherosclerosis) are addressed. SEEK IMMEDIATE MEDICAL CARE IF:   You have sudden weakness or numbness of the face, arm, or leg, especially on one side of the body.  Your face or eyelid droops to one side.  You have sudden confusion.  You have trouble speaking (aphasia) or understanding.  You have sudden trouble seeing in one or both eyes.  You have sudden trouble walking.  You have dizziness.  You have a loss of balance or coordination.  You have a sudden, severe headache with no known cause.  You have new chest pain or an irregular heartbeat. Any of these symptoms may represent a serious problem that is an emergency. Do not wait to see if the symptoms will go away. Get medical help at once. Call your local emergency services (911 in [place] Do not drive yourself to the hospital. Document Released: 04/07/2004 Document Revised: 07/15/2013 Document Reviewed: 08/31/2012 [employer] Patient Information 2015 [employer]. This information is not intended to replace advice given   to you by your health care provider. Make sure you discuss any questions you have with your health care provider.  

## 2013-11-07 NOTE — Progress Notes (Signed)
Established Carotid Patient   History of Present Illness  Eduardo Vasquez is a 80 y.o. male patient of Dr. Oneida Alar who has known carotid stenosis with a history of stroke in 2003, his only symptom was left eye partial upper visual field loss and this deficit remains. He has had no further stroke or TIA symptoms.  He returns today for follow up. Patient has not had previous CEA.  The patient denies facial drooping.  Pt. denies hemiplegia. The patient denies receptive or expressive aphasia. Pt. denies weakness.  The patient's previous neurologic deficits are Unchanged.  He reports that he is scheduled for right inguinal hernia repair on Sept. 1.  He plays a lot of golf, states he has no claudication symptoms with walking.  Pt Diabetic: No  Pt smoker: No   Pt meds include:  Statin : Yes  Betablocker: No  ASA: Yes  Other anticoagulants/antiplatelets: no   Past Medical History  Diagnosis Date  . Carotid artery occlusion   . Cancer 2009    Prostate  . Stroke 2003    Mini - Eye    Social History History  Substance Use Topics  . Smoking status: Never Smoker   . Smokeless tobacco: Never Used  . Alcohol Use: Yes    Family History Family History  Problem Relation Age of Onset  . Hypertension Mother   . Hypertension Father   . Diabetes Daughter     Surgical History No past surgical history on file.  Not on File  Current Outpatient Prescriptions  Medication Sig Dispense Refill  . AMLODIPINE BESYLATE PO Take 10 mg by mouth daily.      Marland Kitchen aspirin 325 MG tablet Take 325 mg by mouth daily.      Marland Kitchen doxazosin (CARDURA) 8 MG tablet Take 8 mg by mouth at bedtime.      . Multiple Vitamin (MULTIVITAMIN) tablet Take 1 tablet by mouth daily.      . pravastatin (PRAVACHOL) 80 MG tablet Take 80 mg by mouth daily.      . Tamsulosin HCl (FLOMAX) 0.4 MG CAPS Take 0.4 mg by mouth daily.       No current facility-administered medications for this visit.    Review of Systems : See HPI  for pertinent positives and negatives.  Physical Examination  Filed Vitals:   11/07/13 1348 11/07/13 1351  BP: 142/73 140/81  Pulse: 61 58  Resp:  16  Height:  6\' 1"  (1.854 m)  Weight:  177 lb (80.287 kg)  SpO2:  100%   Body mass index is 23.36 kg/(m^2).  General: WDWN male in NAD  GAIT: normal  Eyes: PERRLA  Pulmonary: CTAB, Negative Rales, Negative rhonchi, & Negative wheezing,  Cardiac: regular Rhythm , no peripheral edema   Vascular:  Vessel  Right  Left   Radial  Palpable  Palpable   Carotid  audible, without bruit  audible, without bruit   Aorta  Not palpable  N/A   Popliteal  palpable  palpable   PT  Palpable  Palpable   DP  Palpable  Palpable    Gastrointestinal: soft, nontender, BS WNL, no r/g, negative masses;  Musculoskeletal: Negative muscle atrophy/wasting. M/S 5/5 throughout, Extremities without ischemic changes.  Neurologic: A&O X 3; Appropriate Affect ; SENSATION ;normal; MOTOR FUNCTION: normal 5/5 strength in all tested muscle groups.  Speech is normal  CN 2-12 intact, Pain and light touch intact in extremities, Motor exam as listed above   Non-Invasive Vascular Imaging CAROTID DUPLEX 11/07/2013  CEREBROVASCULAR DUPLEX EVALUATION    INDICATION: Carotid artery disease    PREVIOUS INTERVENTION(S): None    DUPLEX EXAM: Carotid duplex    RIGHT  LEFT  Peak Systolic Velocities (cm/s) End Diastolic Velocities (cm/s) Plaque LOCATION Peak Systolic Velocities (cm/s) End Diastolic Velocities (cm/s) Plaque  97 14 - CCA PROXIMAL 94 17 -  113 16 - CCA MID 86 18 -  82 15 - CCA DISTAL 76 15 CP  84 15 - ECA 98 15 HT  39 11 HT ICA PROXIMAL 136 25 CP  68 21 - ICA MID 81 17 -  74 20 - ICA DISTAL 73 22 -    .65 ICA / CCA Ratio (PSV) 1.58  Antegrade Vertebral Flow Antegrade  638 Brachial Systolic Pressure (mmHg) 937  Triphasic Brachial Artery Waveforms Triphasic    Plaque Morphology:  HM = Homogeneous, HT = Heterogeneous, CP = Calcific Plaque, SP = Smooth  Plaque, IP = Irregular Plaque     ADDITIONAL FINDINGS:     IMPRESSION: 1. Less than 40% right internal carotid artery stenosis. 2. Less than 40% left internal carotid artery stenosis, calcific plaque may obscure higher velocity.    Compared to the previous exam:  No change.    Assessment: Eduardo Vasquez is a 80 y.o. male who presents with asymptomatic minimal bilateral ICA stenosis unchanged from a year ago.  Plan: Follow-up in 1 year with Carotid Duplex scan.   I discussed in depth with the patient the nature of atherosclerosis, and emphasized the importance of maximal medical management including strict control of blood pressure, blood glucose, and lipid levels, obtaining regular exercise, and continued cessation of smoking.  The patient is aware that without maximal medical management the underlying atherosclerotic disease process will progress, limiting the benefit of any interventions. The patient was given information about stroke prevention and what symptoms should prompt the patient to seek immediate medical care. Thank you for allowing [place] to participate in this patient's care.  Clemon Chambers, RN, MSN, FNP-C [facility] [place] Office: [place]: Oneida Alar  11/07/2013 1:40 PM

## 2013-11-14 DIAGNOSIS — K4090 Unilateral inguinal hernia, without obstruction or gangrene, not specified as recurrent: Secondary | ICD-10-CM | POA: Diagnosis not present

## 2013-11-15 NOTE — H&P (Signed)
  NTS SOAP Note  Vital Signs:  Vitals as of: 07/19/2618: Systolic 355: Diastolic 74: Heart Rate 81: Temp 97.48F: Height 41ft 1in: Weight 180Lbs 0 Ounces: Pain Level 10: BMI 23.75  BMI : 23.75 kg/m2  Subjective: This 80 year old male presents for of a right inguinal hernia.  Has been present for several months.  Made worse with straining.  Review of Symptoms:  Constitutional:unremarkable   Head:unremarkable Eyes:blurred vision bilateral sinus problems Cardiovascular:  unremarkable Respiratory:unremarkable Gastrointestinal:  unremarkable   Genitourinary:urgency, frequency joint, back pain boils Hematolgic/Lymphatic:unremarkable   Allergic/Immunologic:unremarkable   Past Medical History:  Reviewed  Past Medical History  Surgical History: prostate surgery Medical Problems: prostate cancer,  htn,  chronic kidney disease Allergies: nkda Medications: doxazosin,  pravastatin,  tamsulosin,  amlodipine,  travatan eye drops   Social History:Reviewed  Social History  Preferred Language: English Race:  Black or African American Ethnicity: Not Hispanic / Latino Age: 80 year Marital Status:  S Alcohol: no   Smoking Status: Never smoker reviewed on 11/14/2013 Functional Status reviewed on 11/14/2013 ------------------------------------------------ Bathing: Normal Cooking: Normal Dressing: Normal Driving: Normal Eating: Normal Managing Meds: Normal Oral Care: Normal Shopping: Normal Toileting: Normal Transferring: Normal Walking: Normal Cognitive Status reviewed on 11/14/2013 ------------------------------------------------ Attention: Normal Decision Making: Normal Language: Normal Memory: Normal Motor: Normal Perception: Normal Problem Solving: Normal Visual and Spatial: Normal   Family History:Reviewed  Family Health History Mother, Deceased; Healthy;  Father, Deceased; Healthy;     Objective Information: General:Well appearing,  well nourished in no distress. Neck:Supple without lymphadenopathy.  Heart:RRR, no murmur or gallop.  Normal S1, S2.  No S3, S4.  Lungs:  CTA bilaterally, no wheezes, rhonchi, rales.  Breathing unlabored. Abdomen:Soft, NT/ND, no HSM, no masses.  Reudcible right inguinal hernia.  No left inguinal hernia. HR:[id number]    Assessment:Right inguinal hernia  Diagnoses: 550.90  K40.90 Inguinal hernia (Unilateral inguinal hernia, without obstruction or gangrene, not specified as recurrent)  Procedures: 03212 - OFFICE OUTPATIENT NEW 30 MINUTES    Plan:  Scheduled for right inguinal herniorrhaphy with mesh on 12/02/13.   Patient Education:Alternative treatments to surgery were discussed with patient (and family).  Risks and benefits  of procedure including bleeding,  infection,  mesh use,  and recurrence of the hernia were fully explained to the patient (and family) who gave informed consent. Patient/family questions were addressed.  Follow-up:Pending Surgery

## 2013-11-19 ENCOUNTER — Encounter (HOSPITAL_COMMUNITY): Payer: Self-pay | Admitting: Pharmacy Technician

## 2013-11-26 DIAGNOSIS — Z23 Encounter for immunization: Secondary | ICD-10-CM | POA: Diagnosis not present

## 2013-11-26 NOTE — Patient Instructions (Signed)
Eduardo Vasquez  11/26/2013   Your procedure is scheduled on:  12/02/13  Report to [facility] at 7:00 AM.  Call this number if you have problems the morning of surgery: [phone]   Remember:   Do not eat food or drink liquids after midnight.   Take these medicines the morning of surgery with A SIP OF WATER: Amlodipine, Cardura, Flomax   Do not wear jewelry, make-up or nail polish.  Do not wear lotions, powders, or perfumes. You may wear deodorant.  Do not shave 48 hours prior to surgery. Men may shave face and neck.  Do not bring valuables to the hospital.  [facility] is not responsible for any belongings or valuables.               Contacts, dentures or bridgework may not be worn into surgery.  Leave suitcase in the car. After surgery it may be brought to your room.  For patients admitted to the hospital, discharge time is determined by your treatment team.               Patients discharged the day of surgery will not be allowed to drive home.  Name and phone number of your driver:   Special Instructions: Shower using CHG 2 nights before surgery and the night before surgery.  If you shower the day of surgery use CHG.  Use special wash - you have one bottle of CHG for all showers.  You should use approximately 1/3 of the bottle for each shower.   Please read over the following fact sheets that you were given: Surgical Site Infection Prevention and Anesthesia Post-op Instructions   PATIENT INSTRUCTIONS POST-ANESTHESIA  IMMEDIATELY FOLLOWING SURGERY:  Do not drive or operate machinery for the first twenty four hours after surgery.  Do not make any important decisions for twenty four hours after surgery or while taking narcotic pain medications or sedatives.  If you develop intractable nausea and vomiting or a severe headache please notify your doctor immediately.  FOLLOW-UP:  Please make an appointment with your surgeon as instructed. You do not need to follow up with anesthesia  unless specifically instructed to do so.  WOUND CARE INSTRUCTIONS (if applicable):  Keep a dry clean dressing on the anesthesia/puncture wound site if there is drainage.  Once the wound has quit draining you may leave it open to air.  Generally you should leave the bandage intact for twenty four hours unless there is drainage.  If the epidural site drains for more than 36-48 hours please call the anesthesia department.  QUESTIONS?:  Please feel free to call your physician or the hospital operator if you have any questions, and they will be happy to assist you.      Inguinal Hernia, Adult Muscles help keep everything in the body in its proper place. But if a weak spot in the muscles develops, something can poke through. That is called a hernia. When this happens in the lower part of the belly (abdomen), it is called an inguinal hernia. (It takes its name from a part of the body in this region called the inguinal canal.) A weak spot in the wall of muscles lets some fat or part of the small intestine bulge through. An inguinal hernia can develop at any age. Men get them more often than women. CAUSES  In adults, an inguinal hernia develops over time.  It can be triggered by:  Suddenly straining the muscles of the lower abdomen.  Lifting  heavy objects.  Straining to have a bowel movement. Difficult bowel movements (constipation) can lead to this.  Constant coughing. This may be caused by smoking or lung disease.  Being overweight.  Being pregnant.  Working at a job that requires long periods of standing or heavy lifting.  Having had an inguinal hernia before. One type can be an emergency situation. It is called a strangulated inguinal hernia. It develops if part of the small intestine slips through the weak spot and cannot get back into the abdomen. The blood supply can be cut off. If that happens, part of the intestine may die. This situation requires emergency surgery. SYMPTOMS  Often, a  small inguinal hernia has no symptoms. It is found when a healthcare provider does a physical exam. Larger hernias usually have symptoms.   In adults, symptoms may include:  A lump in the groin. This is easier to see when the person is standing. It might disappear when lying down.  In men, a lump in the scrotum.  Pain or burning in the groin. This occurs especially when lifting, straining or coughing.  A dull ache or feeling of pressure in the groin.  Signs of a strangulated hernia can include:  A bulge in the groin that becomes very painful and tender to the touch.  A bulge that turns red or purple.  Fever, nausea and vomiting.  Inability to have a bowel movement or to pass gas. DIAGNOSIS  To decide if you have an inguinal hernia, a healthcare provider will probably do a physical examination.  This will include asking questions about any symptoms you have noticed.  The healthcare provider might feel the groin area and ask you to cough. If an inguinal hernia is felt, the healthcare provider may try to slide it back into the abdomen.  Usually no other tests are needed. TREATMENT  Treatments can vary. The size of the hernia makes a difference. Options include:  Watchful waiting. This is often suggested if the hernia is small and you have had no symptoms.  No medical procedure will be done unless symptoms develop.  You will need to watch closely for symptoms. If any occur, contact your healthcare provider right away.  Surgery. This is used if the hernia is larger or you have symptoms.  Open surgery. This is usually an outpatient procedure (you will not stay overnight in a hospital). An cut (incision) is made through the skin in the groin. The hernia is put back inside the abdomen. The weak area in the muscles is then repaired by herniorrhaphy or hernioplasty. Herniorrhaphy: in this type of surgery, the weak muscles are sewn back together. Hernioplasty: a patch or mesh is used to  close the weak area in the abdominal wall.  Laparoscopy. In this procedure, a surgeon makes small incisions. A thin tube with a tiny video camera (called a laparoscope) is put into the abdomen. The surgeon repairs the hernia with mesh by looking with the video camera and using two long instruments. HOME CARE INSTRUCTIONS   After surgery to repair an inguinal hernia:  You will need to take pain medicine prescribed by your healthcare provider. Follow all directions carefully.  You will need to take care of the wound from the incision.  Your activity will be restricted for awhile. This will probably include no heavy lifting for several weeks. You also should not do anything too active for a few weeks. When you can return to work will depend on the type of  job that you have.  During "watchful waiting" periods, you should:  Maintain a healthy weight.  Eat a diet high in fiber (fruits, vegetables and whole grains).  Drink plenty of fluids to avoid constipation. This means drinking enough water and other liquids to keep your urine clear or pale yellow.  Do not lift heavy objects.  Do not stand for long periods of time.  Quit smoking. This should keep you from developing a frequent cough. SEEK MEDICAL CARE IF:   A bulge develops in your groin area.  You feel pain, a burning sensation or pressure in the groin. This might be worse if you are lifting or straining.  You develop a fever of more than 100.5 F (38.1 C). SEEK IMMEDIATE MEDICAL CARE IF:   Pain in the groin increases suddenly.  A bulge in the groin gets bigger suddenly and does not go down.  For men, there is sudden pain in the scrotum. Or, the size of the scrotum increases.  A bulge in the groin area becomes red or purple and is painful to touch.  You have nausea or vomiting that does not go away.  You feel your heart beating much faster than normal.  You cannot have a bowel movement or pass gas.  You develop a fever  of more than 102.0 F (38.9 C). Document Released: 07/17/2008 Document Revised: 05/23/2011 Document Reviewed: 07/17/2008 [facility] Patient Information 2015 [place], [place]. This information is not intended to replace advice given to you by your health care provider. Make sure you discuss any questions you have with your health care provider.

## 2013-11-27 ENCOUNTER — Encounter (HOSPITAL_COMMUNITY)
Admission: RE | Admit: 2013-11-27 | Discharge: 2013-11-27 | Disposition: A | Discharge status: Home / Self Care | Payer: Medicare Other | Source: Ambulatory Visit | Attending: General Surgery | Admitting: General Surgery

## 2013-11-27 ENCOUNTER — Encounter (HOSPITAL_COMMUNITY): Payer: Self-pay

## 2013-11-27 DIAGNOSIS — K4090 Unilateral inguinal hernia, without obstruction or gangrene, not specified as recurrent: Secondary | ICD-10-CM | POA: Diagnosis not present

## 2013-11-27 DIAGNOSIS — Z01812 Encounter for preprocedural laboratory examination: Secondary | ICD-10-CM | POA: Insufficient documentation

## 2013-11-27 DIAGNOSIS — Z01810 Encounter for preprocedural cardiovascular examination: Secondary | ICD-10-CM | POA: Insufficient documentation

## 2013-11-27 HISTORY — DX: Essential (primary) hypertension: I10

## 2013-11-27 HISTORY — DX: Pure hypercholesterolemia, unspecified: E78.00

## 2013-11-27 HISTORY — DX: Urge incontinence: N39.41

## 2013-11-27 LAB — BASIC METABOLIC PANEL
ANION GAP: 10 (ref 5–15)
BUN: 28 mg/dL — AB (ref 6–23)
CHLORIDE: 104 meq/L (ref 96–112)
CO2: 28 mEq/L (ref 19–32)
Calcium: 9 mg/dL (ref 8.4–10.5)
Creatinine, Ser: 1.55 mg/dL — ABNORMAL HIGH (ref 0.50–1.35)
GFR, EST AFRICAN AMERICAN: 47 mL/min — AB (ref >90)
GFR, EST NON AFRICAN AMERICAN: 40 mL/min — AB (ref >90)
Glucose, Bld: 102 mg/dL — ABNORMAL HIGH (ref 70–99)
POTASSIUM: 4.2 meq/L (ref 3.7–5.3)
SODIUM: 142 meq/L (ref 137–147)

## 2013-11-27 LAB — CBC WITH DIFFERENTIAL/PLATELET
BASOS ABS: 0 10*3/uL (ref 0.0–0.1)
Basophils Relative: 1 % (ref 0–1)
EOS PCT: 1 % (ref 0–5)
Eosinophils Absolute: 0.1 10*3/uL (ref 0.0–0.7)
HEMATOCRIT: 36.5 % — AB (ref 39.0–52.0)
HEMOGLOBIN: 12 g/dL — AB (ref 13.0–17.0)
LYMPHS PCT: 24 % (ref 12–46)
Lymphs Abs: 1.4 10*3/uL (ref 0.7–4.0)
MCH: 27.3 pg (ref 26.0–34.0)
MCHC: 32.9 g/dL (ref 30.0–36.0)
MCV: 83.1 fL (ref 78.0–100.0)
MONO ABS: 0.6 10*3/uL (ref 0.1–1.0)
MONOS PCT: 11 % (ref 3–12)
Neutro Abs: 3.5 10*3/uL (ref 1.7–7.7)
Neutrophils Relative %: 63 % (ref 43–77)
Platelets: 171 10*3/uL (ref 150–400)
RBC: 4.39 MIL/uL (ref 4.22–5.81)
RDW: 15.4 % (ref 11.5–15.5)
WBC: 5.6 10*3/uL (ref 4.0–10.5)

## 2013-11-27 NOTE — Pre-Procedure Instructions (Signed)
Patient given information to sign up for my chart at home. 

## 2013-11-27 NOTE — Pre-Procedure Instructions (Addendum)
Dr Patsey Berthold aware of EKG. No further orders given.

## 2013-12-02 ENCOUNTER — Encounter (HOSPITAL_COMMUNITY): Payer: Medicare Other | Admitting: Anesthesiology

## 2013-12-02 ENCOUNTER — Encounter (HOSPITAL_COMMUNITY): Payer: Self-pay | Admitting: Anesthesiology

## 2013-12-02 ENCOUNTER — Encounter (HOSPITAL_COMMUNITY)
Admission: RE | Disposition: A | Discharge status: Home / Self Care | Payer: Self-pay | Source: Ambulatory Visit | Attending: General Surgery

## 2013-12-02 ENCOUNTER — Ambulatory Visit (HOSPITAL_COMMUNITY)
Admission: RE | Admit: 2013-12-02 | Discharge: 2013-12-02 | Disposition: A | Discharge status: Home / Self Care | Payer: Medicare Other | Source: Ambulatory Visit | Attending: General Surgery | Admitting: General Surgery

## 2013-12-02 ENCOUNTER — Ambulatory Visit (HOSPITAL_COMMUNITY): Payer: Medicare Other | Admitting: Anesthesiology

## 2013-12-02 DIAGNOSIS — K4090 Unilateral inguinal hernia, without obstruction or gangrene, not specified as recurrent: Secondary | ICD-10-CM | POA: Diagnosis not present

## 2013-12-02 DIAGNOSIS — I10 Essential (primary) hypertension: Secondary | ICD-10-CM | POA: Insufficient documentation

## 2013-12-02 DIAGNOSIS — Z79899 Other long term (current) drug therapy: Secondary | ICD-10-CM | POA: Diagnosis not present

## 2013-12-02 DIAGNOSIS — Z7982 Long term (current) use of aspirin: Secondary | ICD-10-CM | POA: Insufficient documentation

## 2013-12-02 HISTORY — PX: INGUINAL HERNIA REPAIR: SHX194

## 2013-12-02 HISTORY — PX: INSERTION OF MESH: SHX5868

## 2013-12-02 SURGERY — REPAIR, HERNIA, INGUINAL, ADULT
Anesthesia: Monitor Anesthesia Care | Laterality: Right

## 2013-12-02 MED ORDER — MIDAZOLAM HCL 2 MG/2ML IJ SOLN
INTRAMUSCULAR | Status: AC
  Filled 2013-12-02: qty 2

## 2013-12-02 MED ORDER — FENTANYL CITRATE 0.05 MG/ML IJ SOLN: 25.0000 ug | INTRAMUSCULAR | Status: DC | PRN

## 2013-12-02 MED ORDER — FENTANYL CITRATE 0.05 MG/ML IJ SOLN
25.0000 ug | INTRAMUSCULAR | Status: AC
  Administered 2013-12-02 (×2): 25 ug via INTRAVENOUS

## 2013-12-02 MED ORDER — HYDROCODONE-ACETAMINOPHEN 5-325 MG PO TABS: 1.0000 | ORAL_TABLET | ORAL | Status: AC | PRN

## 2013-12-02 MED ORDER — FENTANYL CITRATE 0.05 MG/ML IJ SOLN
INTRAMUSCULAR | Status: AC
  Filled 2013-12-02: qty 2

## 2013-12-02 MED ORDER — BUPIVACAINE LIPOSOME 1.3 % IJ SUSP
INTRAMUSCULAR | Status: DC | PRN
  Administered 2013-12-02: 16 mL

## 2013-12-02 MED ORDER — MIDAZOLAM HCL 2 MG/2ML IJ SOLN
1.0000 mg | INTRAMUSCULAR | Status: DC | PRN
  Administered 2013-12-02: 2 mg via INTRAVENOUS

## 2013-12-02 MED ORDER — LIDOCAINE IN DEXTROSE 5-7.5 % IV SOLN
INTRAVENOUS | Status: DC | PRN
  Administered 2013-12-02: 75 mg via INTRATHECAL

## 2013-12-02 MED ORDER — KETOROLAC TROMETHAMINE 30 MG/ML IJ SOLN
30.0000 mg | Freq: Once | INTRAMUSCULAR | Status: AC
  Administered 2013-12-02: 30 mg via INTRAVENOUS
  Filled 2013-12-02: qty 1

## 2013-12-02 MED ORDER — FENTANYL CITRATE 0.05 MG/ML IJ SOLN
INTRAMUSCULAR | Status: DC | PRN
  Administered 2013-12-02: 25 ug via INTRATHECAL
  Administered 2013-12-02: 25 ug via INTRAVENOUS

## 2013-12-02 MED ORDER — 0.9 % SODIUM CHLORIDE (POUR BTL) OPTIME
TOPICAL | Status: DC | PRN
  Administered 2013-12-02: 1000 mL

## 2013-12-02 MED ORDER — PROPOFOL INFUSION 10 MG/ML OPTIME
INTRAVENOUS | Status: DC | PRN
  Administered 2013-12-02: 25 ug/kg/min via INTRAVENOUS

## 2013-12-02 MED ORDER — BUPIVACAINE LIPOSOME 1.3 % IJ SUSP
INTRAMUSCULAR | Status: AC
  Filled 2013-12-02: qty 20

## 2013-12-02 MED ORDER — CHLORHEXIDINE GLUCONATE 4 % EX LIQD: 1.0000 "application " | Freq: Once | CUTANEOUS | Status: DC

## 2013-12-02 MED ORDER — BUPIVACAINE HCL (PF) 0.5 % IJ SOLN
INTRAMUSCULAR | Status: AC
  Filled 2013-12-02: qty 30

## 2013-12-02 MED ORDER — LACTATED RINGERS IV SOLN
INTRAVENOUS | Status: DC
  Administered 2013-12-02 (×2): via INTRAVENOUS

## 2013-12-02 MED ORDER — ONDANSETRON HCL 4 MG/2ML IJ SOLN: 4.0000 mg | Freq: Once | INTRAMUSCULAR | Status: DC | PRN

## 2013-12-02 MED ORDER — CEFAZOLIN SODIUM-DEXTROSE 2-3 GM-% IV SOLR
2.0000 g | INTRAVENOUS | Status: AC
  Administered 2013-12-02: 2 g via INTRAVENOUS
  Filled 2013-12-02: qty 50

## 2013-12-02 SURGICAL SUPPLY — 44 items
ADH SKN CLS APL DERMABOND .7 (GAUZE/BANDAGES/DRESSINGS) ×1
APL SKNCLS STERI-STRIP NONHPOA (GAUZE/BANDAGES/DRESSINGS) ×1
BAG HAMPER (MISCELLANEOUS) ×3 IMPLANT
BENZOIN TINCTURE PRP APPL 2/3 (GAUZE/BANDAGES/DRESSINGS) ×2 IMPLANT
BLADE 10 SAFETY STRL DISP (BLADE) ×3 IMPLANT
CLOTH BEACON ORANGE TIMEOUT ST (SAFETY) ×3 IMPLANT
COVER LIGHT HANDLE STERIS (MISCELLANEOUS) ×6 IMPLANT
DECANTER SPIKE VIAL GLASS SM (MISCELLANEOUS) ×3 IMPLANT
DERMABOND ADVANCED (GAUZE/BANDAGES/DRESSINGS) ×2
DERMABOND ADVANCED .7 DNX12 (GAUZE/BANDAGES/DRESSINGS) ×1 IMPLANT
DRAIN PENROSE 18X1/2 LTX STRL (DRAIN) ×3 IMPLANT
ELECT REM PT RETURN 9FT ADLT (ELECTROSURGICAL) ×3
ELECTRODE REM PT RTRN 9FT ADLT (ELECTROSURGICAL) ×1 IMPLANT
FORMALIN 10 PREFIL 120ML (MISCELLANEOUS) IMPLANT
GLOVE BIOGEL PI IND STRL 7.0 (GLOVE) IMPLANT
GLOVE BIOGEL PI INDICATOR 7.0 (GLOVE) ×4
GLOVE ECLIPSE 6.5 STRL STRAW (GLOVE) ×2 IMPLANT
GLOVE ECLIPSE 7.0 STRL STRAW (GLOVE) ×2 IMPLANT
GLOVE SURG SS PI 7.5 STRL IVOR (GLOVE) ×3 IMPLANT
GOWN STRL REUS W/ TWL XL LVL3 (GOWN DISPOSABLE) ×1 IMPLANT
GOWN STRL REUS W/TWL LRG LVL3 (GOWN DISPOSABLE) ×6 IMPLANT
GOWN STRL REUS W/TWL XL LVL3 (GOWN DISPOSABLE) ×3
INST SET MINOR GENERAL (KITS) ×3 IMPLANT
KIT ROOM TURNOVER APOR (KITS) ×3 IMPLANT
MANIFOLD NEPTUNE II (INSTRUMENTS) ×3 IMPLANT
MESH HERNIA 1.6X1.9 PLUG LRG (Mesh General) IMPLANT
MESH HERNIA PLUG LRG (Mesh General) ×2 IMPLANT
NDL HYPO 21X1.5 SAFETY (NEEDLE) ×1 IMPLANT
NEEDLE HYPO 21X1.5 SAFETY (NEEDLE) ×3 IMPLANT
NS IRRIG 1000ML POUR BTL (IV SOLUTION) ×3 IMPLANT
PACK MINOR (CUSTOM PROCEDURE TRAY) ×3 IMPLANT
PAD ARMBOARD 7.5X6 YLW CONV (MISCELLANEOUS) ×3 IMPLANT
SET BASIN LINEN APH (SET/KITS/TRAYS/PACK) ×3 IMPLANT
SUT NOVA NAB GS-22 2 2-0 T-19 (SUTURE) ×4 IMPLANT
SUT NOVAFIL NAB HGS22 2-0 30IN (SUTURE) ×2 IMPLANT
SUT SILK 3 0 (SUTURE)
SUT SILK 3-0 18XBRD TIE 12 (SUTURE) IMPLANT
SUT VIC AB 2-0 CT1 27 (SUTURE) ×3
SUT VIC AB 2-0 CT1 TAPERPNT 27 (SUTURE) ×1 IMPLANT
SUT VIC AB 3-0 SH 27 (SUTURE) ×3
SUT VIC AB 3-0 SH 27X BRD (SUTURE) ×1 IMPLANT
SUT VIC AB 4-0 PS2 27 (SUTURE) ×3 IMPLANT
SUT VICRYL AB 3 0 TIES (SUTURE) ×2 IMPLANT
SYR 20CC LL (SYRINGE) ×3 IMPLANT

## 2013-12-02 NOTE — Anesthesia Procedure Notes (Signed)
Spinal  Patient location during procedure: OR Start time: 12/02/2013 9:11 AM Preanesthetic Checklist Completed: patient identified, site marked, surgical consent, pre-op evaluation, timeout performed, IV checked, risks and benefits discussed and monitors and equipment checked Spinal Block Patient position: right lateral decubitus Prep: Betadine Patient monitoring: heart rate, cardiac monitor, continuous pulse ox and blood pressure Approach: right paramedian Location: L3-4 Injection technique: single-shot Needle Needle type: Spinocan  Needle gauge: 22 G Needle length: 9 cm Assessment Sensory level: T8 Additional Notes ATTEMPTS:1 TRAY ID:11/[id number] TRAY EXPIRATION JASN:[id number]

## 2013-12-02 NOTE — Op Note (Signed)
Patient:  Eduardo Vasquez  DOB:  08-11-1929  MRN:  [id number]   Preop Diagnosis:  Right inguinal hernia  Postop Diagnosis:  Same  Procedure:  Right inguinal herniorrhaphy with mesh  Surgeon:  Aviva Signs, M.D.  Anes:  Spinal  Indications:  Patient is an 80 year old black male who presents with a symptomatic right inguinal hernia. The risks and benefits of the procedure including bleeding, infection, and recurrence the hernia were fully explained to the patient, who gave informed consent.  Procedure note:  Patient is placed the supine position after spinal anesthesia was administered. The right groin was prepped and draped using usual sterile technique with Betadine. Surgical site confirmation was performed.  An incision was made in the right groin region down to the external oblique aponeuroses. The aponeurosis was incised to the external ring. A Penrose drain was placed around the spermatic cord. The vas deferens was noted within the spermatic cord. The ilioinguinal nerve was identified retracted superiorly from the operative field. An indirect hernia sac was found. This was freed away from the spermatic cord up to the peritoneal reflection and inverted. A large size Bard PerFix plug was then inserted. An onlay patch was then placed along the floor of inguinal canal and secured superiorly to the conjoined tendon and inferiorly to the shelving edge of Poupart's ligament using 2-0 Novafil interrupted sutures. The internal ring was recreated using a 2-0 Novafil interrupted suture. The external oblique aponeuroses was reapproximated using a 2-0 Vicryl running suture. Subcutaneous layer was reapproximated using 3-0 Vicryl interrupted suture. The skin was closed using a 4-0 Vicryl subcuticular suture. Exaparel was instilled the surrounding wound. Dermabond was then applied.  All tape and needle counts were correct at the end of the procedure. Patient was transferred to PACU in stable  condition.  Complications:  None  EBL:  Minimal  Specimen:  None

## 2013-12-02 NOTE — Anesthesia Preprocedure Evaluation (Signed)
Anesthesia Evaluation  Patient identified by MRN, date of birth, ID band Patient awake    Reviewed: Allergy & Precautions, H&P , NPO status , Patient's Chart, lab work & pertinent test results  Airway Mallampati: II TM Distance: >3 FB     Dental  (+) Teeth Intact, Poor Dentition   Pulmonary neg pulmonary ROS,  breath sounds clear to auscultation        Cardiovascular hypertension, Pt. on medications + Peripheral Vascular Disease Rhythm:Regular Rate:Normal     Neuro/Psych CVA (2003 ), Residual Symptoms    GI/Hepatic negative GI ROS,   Endo/Other    Renal/GU      Musculoskeletal   Abdominal   Peds  Hematology   Anesthesia Other Findings   Reproductive/Obstetrics                           Anesthesia Physical Anesthesia Plan  ASA: III  Anesthesia Plan: MAC and Spinal   Post-op Pain Management:    Induction: Intravenous  Airway Management Planned: Nasal Cannula  Additional Equipment:   Intra-op Plan:   Post-operative Plan:   Informed Consent: I have reviewed the patients History and Physical, chart, labs and discussed the procedure including the risks, benefits and alternatives for the proposed anesthesia with the patient or authorized representative who has indicated his/her understanding and acceptance.     Plan Discussed with:   Anesthesia Plan Comments:         Anesthesia Quick Evaluation

## 2013-12-02 NOTE — Addendum Note (Signed)
Addendum created 12/02/13 1206 by Ollen Bowl, CRNA   Modules edited: Anesthesia Medication Administration

## 2013-12-02 NOTE — Interval H&P Note (Signed)
History and Physical Interval Note:  12/02/2013 8:44 AM  Eduardo Vasquez  has presented today for surgery, with the diagnosis of right inguinal hernia  The various methods of treatment have been discussed with the patient and family. After consideration of risks, benefits and other options for treatment, the patient has consented to  Procedure(s): HERNIA REPAIR INGUINAL ADULT WITH MESH (Right) as a surgical intervention .  The patient's history has been reviewed, patient examined, no change in status, stable for surgery.  I have reviewed the patient's chart and labs.  Questions were answered to the patient's satisfaction.     Aviva Signs A

## 2013-12-02 NOTE — Discharge Instructions (Signed)
Inguinal Hernia, Adult  Care After Refer to this sheet in the next few weeks. These discharge instructions provide you with general information on caring for yourself after you leave the hospital. Your caregiver may also give you specific instructions. Your treatment has been planned according to the most current medical practices available, but unavoidable complications sometimes occur. If you have any problems or questions after discharge, please call your caregiver. HOME CARE INSTRUCTIONS  Put ice on the operative site.  Put ice in a plastic bag.  Place a towel between your skin and the bag.  Leave the ice on for 15-20 minutes at a time, 03-04 times a day while awake.  Change bandages (dressings) as directed.  Keep the wound dry and clean. The wound may be washed gently with soap and water. Gently blot or dab the wound dry. It is okay to take showers 24 to 48 hours after surgery. Do not take baths, use swimming pools, or use hot tubs for 10 days, or as directed by your caregiver.  Only take over-the-counter or prescription medicines for pain, discomfort, or fever as directed by your caregiver.  Continue your normal diet as directed.  Do not lift anything more than 10 pounds or play contact sports for 3 weeks, or as directed. SEEK MEDICAL CARE IF:  There is redness, swelling, or increasing pain in the wound.  There is fluid (pus) coming from the wound.  There is drainage from a wound lasting longer than 1 day.  You have an oral temperature above 102 F (38.9 C).  You notice a bad smell coming from the wound or dressing.  The wound breaks open after the stitches (sutures) have been removed.  You notice increasing pain in the shoulders (shoulder strap areas).  You develop dizzy episodes or fainting while standing.  You feel sick to your stomach (nauseous) or throw up (vomit). SEEK IMMEDIATE MEDICAL CARE IF:  You develop a rash.  You have difficulty breathing.  You  develop a reaction or have side effects to medicines you were given. MAKE SURE YOU:   Understand these instructions.  Will watch your condition.  Will get help right away if you are not doing well or get worse. Document Released: 03/31/2006 Document Revised: 05/23/2011 Document Reviewed: 01/28/2009 [facility] Patient Information 2015 [place], [place]. This information is not intended to replace advice given to you by your health care provider. Make sure you discuss any questions you have with your health care provider.   Spinal Anesthesia Care After HOME CARE INSTRUCTIONS Do not drive or operate machinery for at least 24 hours after receiving anesthesia. Make sure someone is available to drive you home. Do not drink alcohol for at least 24 hours after receiving anesthesia. Do not make important decisions for 24 hours after having spinal or epidural anesthesia. Your thinking may be unclear. Have someone stay with you for at least 24 to 48 hours following surgery. Drink lots of fluids when you get home. If you are an adult, drink eight, 8 ounce glasses of water per day, or as directed. Keep your post-operative appointments as suggested. SEEK IMMEDIATE MEDICAL CARE IF:  You develop a fever or any temperature over 100.4 F (38 C). You have a persistent or severe headache. You develop blurred or double vision. You develop dizziness, fainting or lightheadedness. You have weakness, numbness, or tingling in your arms or legs. You develop a skin rash. You have difficulty breathing. You have persistent nausea and vomiting. You are unable to  pass urine. Document Released: 05/21/2003 Document Revised: 05/23/2011 Document Reviewed: 04/03/2007 [facility] Patient Information 2015 [place], [place]. This information is not intended to replace advice given to you by your health care provider. Make sure you discuss any questions you have with your health care provider.

## 2013-12-02 NOTE — Anesthesia Postprocedure Evaluation (Signed)
  Anesthesia Post-op Note  Patient: Eduardo Vasquez  Procedure(s) Performed: Procedure(s): HERNIA REPAIR INGUINAL ADULT WITH MESH (Right) INSERTION OF MESH (Right)  Patient Location: PACU  Anesthesia Type:Spinal  Level of Consciousness: awake, alert  and oriented  Airway and Oxygen Therapy: Patient Spontanous Breathing and Patient connected to nasal cannula oxygen  Post-op Pain: none  Post-op Assessment: Post-op Vital signs reviewed, Patient's Cardiovascular Status Stable, Respiratory Function Stable, Patent Airway and No signs of Nausea or vomiting  Post-op Vital Signs: Reviewed and stable  Last Vitals:  Filed Vitals:   12/02/13 0845  BP: 154/81  Temp:   Resp: 38    Complications: No apparent anesthesia complications

## 2013-12-02 NOTE — Transfer of Care (Signed)
Immediate Anesthesia Transfer of Care Note  Patient: Eduardo Vasquez  Procedure(s) Performed: Procedure(s): HERNIA REPAIR INGUINAL ADULT WITH MESH (Right) INSERTION OF MESH (Right)  Patient Location: PACU  Anesthesia Type:Spinal  Level of Consciousness: awake and alert   Airway & Oxygen Therapy: Patient Spontanous Breathing and Patient connected to nasal cannula oxygen  Post-op Assessment: Report given to PACU RN  Post vital signs: Reviewed and stable  Complications: No apparent anesthesia complications

## 2013-12-03 ENCOUNTER — Encounter (HOSPITAL_COMMUNITY): Payer: Self-pay | Admitting: General Surgery

## 2014-01-10 DIAGNOSIS — N189 Chronic kidney disease, unspecified: Secondary | ICD-10-CM | POA: Diagnosis not present

## 2014-01-10 DIAGNOSIS — I10 Essential (primary) hypertension: Secondary | ICD-10-CM | POA: Diagnosis not present

## 2014-01-10 DIAGNOSIS — I120 Hypertensive chronic kidney disease with stage 5 chronic kidney disease or end stage renal disease: Secondary | ICD-10-CM | POA: Diagnosis not present

## 2014-01-10 DIAGNOSIS — C61 Malignant neoplasm of prostate: Secondary | ICD-10-CM | POA: Diagnosis not present

## 2014-01-14 DIAGNOSIS — I6350 Cerebral infarction due to unspecified occlusion or stenosis of unspecified cerebral artery: Secondary | ICD-10-CM | POA: Diagnosis not present

## 2014-01-14 DIAGNOSIS — C61 Malignant neoplasm of prostate: Secondary | ICD-10-CM | POA: Diagnosis not present

## 2014-01-14 DIAGNOSIS — I120 Hypertensive chronic kidney disease with stage 5 chronic kidney disease or end stage renal disease: Secondary | ICD-10-CM | POA: Diagnosis not present

## 2014-01-14 DIAGNOSIS — N189 Chronic kidney disease, unspecified: Secondary | ICD-10-CM | POA: Diagnosis not present

## 2014-01-14 DIAGNOSIS — I10 Essential (primary) hypertension: Secondary | ICD-10-CM | POA: Diagnosis not present

## 2014-01-17 ENCOUNTER — Ambulatory Visit: Payer: Medicare Other | Admitting: Urology

## 2014-03-31 DIAGNOSIS — I10 Essential (primary) hypertension: Secondary | ICD-10-CM | POA: Diagnosis not present

## 2014-03-31 DIAGNOSIS — M25562 Pain in left knee: Secondary | ICD-10-CM | POA: Diagnosis not present

## 2014-03-31 DIAGNOSIS — C61 Malignant neoplasm of prostate: Secondary | ICD-10-CM | POA: Diagnosis not present

## 2014-06-20 DIAGNOSIS — H4011X3 Primary open-angle glaucoma, severe stage: Secondary | ICD-10-CM | POA: Diagnosis not present

## 2014-07-17 DIAGNOSIS — H4011X3 Primary open-angle glaucoma, severe stage: Secondary | ICD-10-CM | POA: Diagnosis not present

## 2014-07-17 DIAGNOSIS — H2513 Age-related nuclear cataract, bilateral: Secondary | ICD-10-CM | POA: Diagnosis not present

## 2014-07-18 DIAGNOSIS — I6350 Cerebral infarction due to unspecified occlusion or stenosis of unspecified cerebral artery: Secondary | ICD-10-CM | POA: Diagnosis not present

## 2014-07-18 DIAGNOSIS — E785 Hyperlipidemia, unspecified: Secondary | ICD-10-CM | POA: Diagnosis not present

## 2014-07-18 DIAGNOSIS — I129 Hypertensive chronic kidney disease with stage 1 through stage 4 chronic kidney disease, or unspecified chronic kidney disease: Secondary | ICD-10-CM | POA: Diagnosis not present

## 2014-07-18 DIAGNOSIS — C61 Malignant neoplasm of prostate: Secondary | ICD-10-CM | POA: Diagnosis not present

## 2014-11-12 ENCOUNTER — Encounter: Payer: Self-pay | Admitting: Family

## 2014-11-13 ENCOUNTER — Encounter (HOSPITAL_COMMUNITY): Payer: Medicare Other

## 2014-11-13 ENCOUNTER — Ambulatory Visit: Payer: Medicare Other | Admitting: Family

## 2014-11-19 ENCOUNTER — Encounter: Payer: Self-pay | Admitting: Family

## 2014-11-20 ENCOUNTER — Encounter: Payer: Self-pay | Admitting: Family

## 2014-11-20 ENCOUNTER — Ambulatory Visit (HOSPITAL_COMMUNITY)
Admission: RE | Admit: 2014-11-20 | Discharge: 2014-11-20 | Disposition: A | Discharge status: Home / Self Care | Payer: Medicare Other | Source: Ambulatory Visit | Attending: Family | Admitting: Family

## 2014-11-20 ENCOUNTER — Ambulatory Visit (INDEPENDENT_AMBULATORY_CARE_PROVIDER_SITE_OTHER): Payer: Medicare Other | Admitting: Family

## 2014-11-20 ENCOUNTER — Other Ambulatory Visit: Payer: Self-pay | Admitting: Vascular Surgery

## 2014-11-20 VITALS — BP 140/75 | HR 74 | Temp 98.5°F | Ht 73.0 in | Wt 183.1 lb

## 2014-11-20 DIAGNOSIS — I10 Essential (primary) hypertension: Secondary | ICD-10-CM | POA: Diagnosis not present

## 2014-11-20 DIAGNOSIS — I6523 Occlusion and stenosis of bilateral carotid arteries: Secondary | ICD-10-CM | POA: Diagnosis not present

## 2014-11-20 DIAGNOSIS — Z8673 Personal history of transient ischemic attack (TIA), and cerebral infarction without residual deficits: Secondary | ICD-10-CM | POA: Diagnosis not present

## 2014-11-20 DIAGNOSIS — I6529 Occlusion and stenosis of unspecified carotid artery: Secondary | ICD-10-CM | POA: Insufficient documentation

## 2014-11-20 DIAGNOSIS — E785 Hyperlipidemia, unspecified: Secondary | ICD-10-CM | POA: Insufficient documentation

## 2014-11-20 NOTE — Patient Instructions (Signed)
Stroke Prevention Some medical conditions and behaviors are associated with an increased chance of having a stroke. You may prevent a stroke by making healthy choices and managing medical conditions. HOW CAN I REDUCE MY RISK OF HAVING A STROKE?   Stay physically active. Get at least 30 minutes of activity on most or all days.  Do not smoke. It may also be helpful to avoid exposure to secondhand smoke.  Limit alcohol use. Moderate alcohol use is considered to be:  No more than 2 drinks per day for men.  No more than 1 drink per day for nonpregnant women.  Eat healthy foods. This involves:  Eating 5 or more servings of fruits and vegetables a day.  Making dietary changes that address high blood pressure (hypertension), high cholesterol, diabetes, or obesity.  Manage your cholesterol levels.  Making food choices that are high in fiber and low in saturated fat, trans fat, and cholesterol may control cholesterol levels.  Take any prescribed medicines to control cholesterol as directed by your health care provider.  Manage your diabetes.  Controlling your carbohydrate and sugar intake is recommended to manage diabetes.  Take any prescribed medicines to control diabetes as directed by your health care provider.  Control your hypertension.  Making food choices that are low in salt (sodium), saturated fat, trans fat, and cholesterol is recommended to manage hypertension.  Take any prescribed medicines to control hypertension as directed by your health care provider.  Maintain a healthy weight.  Reducing calorie intake and making food choices that are low in sodium, saturated fat, trans fat, and cholesterol are recommended to manage weight.  Stop drug abuse.  Avoid taking birth control pills.  Talk to your health care provider about the risks of taking birth control pills if you are over 35 years old, smoke, get migraines, or have ever had a blood clot.  Get evaluated for sleep  disorders (sleep apnea).  Talk to your health care provider about getting a sleep evaluation if you snore a lot or have excessive sleepiness.  Take medicines only as directed by your health care provider.  For some people, aspirin or blood thinners (anticoagulants) are helpful in reducing the risk of forming abnormal blood clots that can lead to stroke. If you have the irregular heart rhythm of atrial fibrillation, you should be on a blood thinner unless there is a good reason you cannot take them.  Understand all your medicine instructions.  Make sure that other conditions (such as anemia or atherosclerosis) are addressed. SEEK IMMEDIATE MEDICAL CARE IF:   You have sudden weakness or numbness of the face, arm, or leg, especially on one side of the body.  Your face or eyelid droops to one side.  You have sudden confusion.  You have trouble speaking (aphasia) or understanding.  You have sudden trouble seeing in one or both eyes.  You have sudden trouble walking.  You have dizziness.  You have a loss of balance or coordination.  You have a sudden, severe headache with no known cause.  You have new chest pain or an irregular heartbeat. Any of these symptoms may represent a serious problem that is an emergency. Do not wait to see if the symptoms will go away. Get medical help at once. Call your local emergency services (911 in [place] Do not drive yourself to the hospital. Document Released: 04/07/2004 Document Revised: 07/15/2013 Document Reviewed: 08/31/2012 [employer] Patient Information 2015 [employer]. This information is not intended to replace advice given   to you by your health care provider. Make sure you discuss any questions you have with your health care provider.  

## 2014-11-20 NOTE — Progress Notes (Signed)
Established Carotid Patient   History of Present Illness  Eduardo Vasquez is a 81 y.o. male patient of Dr. Oneida Alar who has known carotid stenosis with a history of stroke in 2003, his only symptom was left eye partial upper visual field loss and this deficit remains. He has had no further stroke or TIA symptoms.  He returns today for follow up. Patient has not had previous carotid artery intervention. The patient denies a history of unilateral facial drooping, hemiplegia, or receptive or expressive aphasia.  He reports that he had a right inguinal hernia repair in September 2015. He plays a lot of golf, states he has no claudication symptoms with walking.   Pt Diabetic: No  Pt smoker: No   Pt meds include:  Statin : Yes  Betablocker: No  ASA: Yes  Other anticoagulants/antiplatelets: no      Past Medical History  Diagnosis Date  . Carotid artery occlusion   . Cancer 2009    Prostate  . Stroke 2003    Mini - Left Eye- Partial Blindness  . Hypertension   . Hypercholesteremia   . Urgency incontinence     Social History Social History  Substance Use Topics  . Smoking status: Never Smoker   . Smokeless tobacco: Never Used  . Alcohol Use: No     Comment: 27 years alcohol free in AA    Family History Family History  Problem Relation Age of Onset  . Hypertension Mother   . Hyperlipidemia Mother   . Hypertension Father   . Hyperlipidemia Father   . Diabetes Daughter     Surgical History Past Surgical History  Procedure Laterality Date  . Inguinal hernia repair Right 12/02/2013    Procedure: HERNIA REPAIR INGUINAL ADULT WITH MESH;  Surgeon: Jamesetta So, MD;  Location: [facility];  Service: General;  Laterality: Right;  . Insertion of mesh Right 12/02/2013    Procedure: INSERTION OF MESH;  Surgeon: Jamesetta So, MD;  Location: [facility];  Service: General;  Laterality: Right;    No Known Allergies  Current Outpatient Prescriptions  Medication Sig Dispense  Refill  . aspirin 325 MG tablet Take 325 mg by mouth daily.    Marland Kitchen doxazosin (CARDURA) 4 MG tablet Take 4 mg by mouth daily.    . Multiple Vitamin (MULTIVITAMIN) tablet Take 1 tablet by mouth daily.    . pravastatin (PRAVACHOL) 80 MG tablet Take 80 mg by mouth daily.    . Tamsulosin HCl (FLOMAX) 0.4 MG CAPS Take 0.4 mg by mouth daily.    Marland Kitchen HYDROcodone-acetaminophen (NORCO/VICODIN) 5-325 MG per tablet Take 1 tablet by mouth every 4 (four) hours as needed for moderate pain. (Patient not taking: Reported on 11/20/2014) 40 tablet 0   No current facility-administered medications for this visit.    Review of Systems : See HPI for pertinent positives and negatives.  Physical Examination  Filed Vitals:   11/20/14 0955 11/20/14 0957  BP: 151/78 140/75  Pulse: 74   Temp: 98.5 F (36.9 C)   TempSrc: Oral   Height: 6\' 1"  (1.854 m)   Weight: 183 lb 1.6 oz (83.054 kg)   SpO2: 99%    Body mass index is 24.16 kg/(m^2).  General: WDWN male in NAD  GAIT: normal  Eyes: PERRLA  Pulmonary: CTAB, Negative Rales, Negative rhonchi, & Negative wheezing,  Cardiac: regularly irregular Rhythm. Left ankle non pitting edema at trace   Vascular:  Vessel  Right  Left   Radial  Palpable  Palpable   Carotid  audible, without bruit  audible, without bruit   Aorta  Not palpable  N/A   Popliteal  palpable  palpable   PT  Palpable  Palpable   DP  Palpable  Palpable    Gastrointestinal: soft, nontender, BS WNL, no r/g, no palpable masses;  Musculoskeletal: Negative muscle atrophy/wasting. M/S 5/5 throughout, Extremities without ischemic changes.  Neurologic: A&O X 3; Appropriate Affect, MOTOR FUNCTION: normal 5/5 strength in all tested muscle groups.  Speech is normal  CN 2-12 intact, Pain and light touch intact in extremities, Motor exam as listed above          Non-Invasive Vascular Imaging CAROTID DUPLEX 11/20/2014   CEREBROVASCULAR DUPLEX EVALUATION    INDICATION:  Carotid artery disease     PREVIOUS INTERVENTION(S):     DUPLEX EXAM:     RIGHT  LEFT  Peak Systolic Velocities (cm/s) End Diastolic Velocities (cm/s) Plaque LOCATION Peak Systolic Velocities (cm/s) End Diastolic Velocities (cm/s) Plaque  133 15  CCA PROXIMAL 131 17   113 16  CCA MID 93 15   99 17  CCA DISTAL 98 19 CP  101 8  ECA 105 14 CP  80 13 HT ICA PROXIMAL 122 30 CP  79 24  ICA MID 91 23   82 26  ICA DISTAL 62 19     0.72 ICA / CCA Ratio (PSV) 1.31  Antegrade  Vertebral Flow Antegrade   818 Brachial Systolic Pressure (mmHg) 299  Multiphasic (Subclavian artery) Brachial Artery Waveforms Multiphasic (Subclavian artery)    Plaque Morphology:  HM = Homogeneous, HT = Heterogeneous, CP = Calcific Plaque, SP = Smooth Plaque, IP = Irregular Plaque  ADDITIONAL FINDINGS:     IMPRESSION: Bilateral internal carotid artery velocities suggest a <40% stenosis.     Compared to the previous exam:  No significant change in comparison to the last exam on 11/07/2013.       Assessment: Eduardo Vasquez is a 81 y.o. male who has a history of a left ocular stroke in 2003, no subsequent strokes or TIA's. Today's carotid Duplex suggests  <40% bilateral internal carotid artery stenosis. No significant change in comparison to the last exam on 11/07/2013. Fortunately he has never used tobacco and does not have DM.   Plan: Follow-up in 1 year with Carotid Duplex.   I discussed in depth with the patient the nature of atherosclerosis, and emphasized the importance of maximal medical management including strict control of blood pressure, blood glucose, and lipid levels, obtaining regular exercise, and continued cessation of smoking.  The patient is aware that without maximal medical management the underlying atherosclerotic disease process will progress, limiting the benefit of any interventions. The patient was given information about stroke prevention and what symptoms should prompt the patient to  seek immediate medical care. Thank you for allowing [place] to participate in this patient's care.  Clemon Chambers, RN, MSN, FNP-C [facility] Office: [phone]  Clinic Physician: Oneida Alar  11/20/2014 10:17 AM

## 2014-11-20 NOTE — Addendum Note (Signed)
Addended by: Reola Calkins on: 11/20/2014 03:44 PM   Modules accepted: Orders

## 2014-11-25 DIAGNOSIS — Z23 Encounter for immunization: Secondary | ICD-10-CM | POA: Diagnosis not present

## 2014-12-31 DIAGNOSIS — H401132 Primary open-angle glaucoma, bilateral, moderate stage: Secondary | ICD-10-CM | POA: Diagnosis not present

## 2014-12-31 DIAGNOSIS — H2512 Age-related nuclear cataract, left eye: Secondary | ICD-10-CM | POA: Diagnosis not present

## 2015-01-19 DIAGNOSIS — I6350 Cerebral infarction due to unspecified occlusion or stenosis of unspecified cerebral artery: Secondary | ICD-10-CM | POA: Diagnosis not present

## 2015-01-19 DIAGNOSIS — Z125 Encounter for screening for malignant neoplasm of prostate: Secondary | ICD-10-CM | POA: Diagnosis not present

## 2015-01-19 DIAGNOSIS — I129 Hypertensive chronic kidney disease with stage 1 through stage 4 chronic kidney disease, or unspecified chronic kidney disease: Secondary | ICD-10-CM | POA: Diagnosis not present

## 2015-01-19 DIAGNOSIS — I499 Cardiac arrhythmia, unspecified: Secondary | ICD-10-CM | POA: Diagnosis not present

## 2015-01-19 DIAGNOSIS — I10 Essential (primary) hypertension: Secondary | ICD-10-CM | POA: Diagnosis not present

## 2015-01-29 DIAGNOSIS — H401132 Primary open-angle glaucoma, bilateral, moderate stage: Secondary | ICD-10-CM | POA: Diagnosis not present

## 2015-01-29 DIAGNOSIS — H2513 Age-related nuclear cataract, bilateral: Secondary | ICD-10-CM | POA: Diagnosis not present

## 2015-04-07 DIAGNOSIS — H2513 Age-related nuclear cataract, bilateral: Secondary | ICD-10-CM | POA: Diagnosis not present

## 2015-04-07 DIAGNOSIS — H401112 Primary open-angle glaucoma, right eye, moderate stage: Secondary | ICD-10-CM | POA: Diagnosis not present

## 2015-04-07 DIAGNOSIS — H401132 Primary open-angle glaucoma, bilateral, moderate stage: Secondary | ICD-10-CM | POA: Diagnosis not present

## 2015-06-30 DIAGNOSIS — Z23 Encounter for immunization: Secondary | ICD-10-CM | POA: Diagnosis not present

## 2015-07-15 ENCOUNTER — Encounter (HOSPITAL_COMMUNITY): Payer: Self-pay | Admitting: Emergency Medicine

## 2015-07-15 ENCOUNTER — Emergency Department (HOSPITAL_COMMUNITY)
Admission: EM | Admit: 2015-07-15 | Discharge: 2015-07-15 | Disposition: A | Discharge status: Home / Self Care | Payer: Medicare Other | Attending: Emergency Medicine | Admitting: Emergency Medicine

## 2015-07-15 DIAGNOSIS — Z8546 Personal history of malignant neoplasm of prostate: Secondary | ICD-10-CM | POA: Diagnosis not present

## 2015-07-15 DIAGNOSIS — I10 Essential (primary) hypertension: Secondary | ICD-10-CM | POA: Diagnosis not present

## 2015-07-15 DIAGNOSIS — Z8673 Personal history of transient ischemic attack (TIA), and cerebral infarction without residual deficits: Secondary | ICD-10-CM | POA: Diagnosis not present

## 2015-07-15 DIAGNOSIS — Y999 Unspecified external cause status: Secondary | ICD-10-CM | POA: Insufficient documentation

## 2015-07-15 DIAGNOSIS — Z7982 Long term (current) use of aspirin: Secondary | ICD-10-CM | POA: Insufficient documentation

## 2015-07-15 DIAGNOSIS — W450XXA Nail entering through skin, initial encounter: Secondary | ICD-10-CM | POA: Insufficient documentation

## 2015-07-15 DIAGNOSIS — Y939 Activity, unspecified: Secondary | ICD-10-CM | POA: Insufficient documentation

## 2015-07-15 DIAGNOSIS — Z23 Encounter for immunization: Secondary | ICD-10-CM | POA: Diagnosis not present

## 2015-07-15 DIAGNOSIS — S91332A Puncture wound without foreign body, left foot, initial encounter: Secondary | ICD-10-CM | POA: Insufficient documentation

## 2015-07-15 DIAGNOSIS — Z79899 Other long term (current) drug therapy: Secondary | ICD-10-CM | POA: Diagnosis not present

## 2015-07-15 DIAGNOSIS — S99922A Unspecified injury of left foot, initial encounter: Secondary | ICD-10-CM | POA: Diagnosis present

## 2015-07-15 DIAGNOSIS — Y9289 Other specified places as the place of occurrence of the external cause: Secondary | ICD-10-CM | POA: Diagnosis not present

## 2015-07-15 MED ORDER — BACITRACIN-NEOMYCIN-POLYMYXIN 400-5-5000 EX OINT
TOPICAL_OINTMENT | Freq: Every day | CUTANEOUS | Status: DC
  Administered 2015-07-15: 1 via TOPICAL
  Filled 2015-07-15: qty 1

## 2015-07-15 MED ORDER — TETANUS-DIPHTH-ACELL PERTUSSIS 5-2.5-18.5 LF-MCG/0.5 IM SUSP
0.5000 mL | Freq: Once | INTRAMUSCULAR | Status: AC
  Administered 2015-07-15: 0.5 mL via INTRAMUSCULAR
  Filled 2015-07-15: qty 0.5

## 2015-07-15 NOTE — Discharge Instructions (Signed)
Puncture Wound A puncture wound is an injury that is caused by a sharp, thin object that goes through (penetrates) your skin. Usually, a puncture wound does not leave a large opening in your skin, so it may not bleed a lot. However, when you get a puncture wound, dirt or other materials (foreign bodies) can be forced into your wound and break off inside. This increases the chance of infection, such as tetanus. CAUSES Puncture wounds are caused by any sharp, thin object that goes through your skin, such as:  Animal teeth, as with an animal bite.  Sharp, pointed objects, such as nails, splinters of glass, fishhooks, and needles. SYMPTOMS Symptoms of a puncture wound include:  Pain.  Bleeding.  Swelling.  Bruising.  Fluid leaking from the wound.  Numbness, tingling, or loss of function. DIAGNOSIS This condition is diagnosed with a medical history and physical exam. Your wound will be checked to see if it contains any foreign bodies. You may also have X-rays or other imaging tests. TREATMENT Treatment for a puncture wound depends on how serious the wound is. It also depends on whether the wound contains any foreign bodies. Treatment for all types of puncture wounds usually starts with:  Controlling the bleeding.  Washing out the wound with a germ-free (sterile) salt-water solution.  Checking the wound for foreign bodies. Treatment may also include:  Having the wound opened surgically to remove a foreign object.  Closing the wound with stitches (sutures) if it continues to bleed.  Covering the wound with antibiotic ointments and a bandage (dressing).  Receiving a tetanus shot.  Receiving a rabies vaccine. HOME CARE INSTRUCTIONS Medicines  Take or apply over-the-counter and prescription medicines only as told by your health care provider.  If you were prescribed an antibiotic, take or apply it as told by your health care provider. Do not stop using the antibiotic even if  your condition improves. Wound Care  There are many ways to close and cover a wound. For example, a wound can be covered with sutures, skin glue, or adhesive strips. Follow instructions from your health care provider about:  How to take care of your wound.  When and how you should change your dressing.  When you should remove your dressing.  Removing whatever was used to close your wound.  Keep the dressing dry as told by your health care provider. Do not take baths, swim, use a hot tub, or do anything that would put your wound underwater until your health care provider approves.  Clean the wound as told by your health care provider.  Do not scratch or pick at the wound.  Check your wound every day for signs of infection. Watch for:  Redness, swelling, or pain.  Fluid, blood, or pus. General Instructions  Raise (elevate) the injured area above the level of your heart while you are sitting or lying down.  If your puncture wound is in your foot, ask your health care provider if you need to avoid putting weight on your foot and for how long.  Keep all follow-up visits as told by your health care provider. This is important. SEEK MEDICAL CARE IF:  You received a tetanus shot and you have swelling, severe pain, redness, or bleeding at the injection site.  You have a fever.  Your sutures come out.  You notice a bad smell coming from your wound or your dressing.  You notice something coming out of your wound, such as wood or glass.  Your   pain is not controlled with medicine.  You have increased redness, swelling, or pain at the site of your wound.  You have fluid, blood, or pus coming from your wound.  You notice a change in the color of your skin near your wound.  You need to change the dressing frequently due to fluid, blood, or pus draining from your wound.  You develop a new rash.  You develop numbness around your wound. SEEK IMMEDIATE MEDICAL CARE IF:  You  develop severe swelling around your wound.  Your pain suddenly increases and is severe.  You develop painful skin lumps.  You have a red streak going away from your wound.  The wound is on your hand or foot and you cannot properly move a finger or toe.  The wound is on your hand or foot and you notice that your fingers or toes look pale or bluish.   This information is not intended to replace advice given to you by your health care provider. Make sure you discuss any questions you have with your health care provider.   Document Released: 12/08/2004 Document Revised: 11/19/2014 Document Reviewed: 04/23/2014 Elsevier Interactive Patient Education 2016 [employer]vier Inc[employer]  

## 2015-07-15 NOTE — ED Notes (Signed)
Pt reports stepped on a nail approximately 1 hour prior to arrival. Pt was wearing shoe at the time. Pt reports nail went through the shoe and sock to left foot. Minimal entry point noted to mid of left foot. Moderate DP pulses. nad noted.

## 2015-07-15 NOTE — ED Provider Notes (Signed)
CSN: [id number]     Arrival date & time 07/15/15  1801 History   First MD Initiated Contact with Patient 07/15/15 1822     Chief Complaint  Patient presents with  . Foot Injury     (Consider location/radiation/quality/duration/timing/severity/associated sxs/prior Treatment) HPI Patient states he was helping his daughter move when he stepped on a nail on the lawn. Nail penetrated through his shoe and broke the skin of the undersurface of his left foot. Denies any pain currently. No active bleeding. Unknown last tetanus. Past Medical History  Diagnosis Date  . Carotid artery occlusion   . Cancer [facility]) 2009    Prostate  . Stroke [facility]) 2003    Mini - Left Eye- Partial Blindness  . Hypertension   . Hypercholesteremia   . Urgency incontinence    Past Surgical History  Procedure Laterality Date  . Inguinal hernia repair Right 12/02/2013    Procedure: HERNIA REPAIR INGUINAL ADULT WITH MESH;  Surgeon: Jamesetta So, MD;  Location: [facility];  Service: General;  Laterality: Right;  . Insertion of mesh Right 12/02/2013    Procedure: INSERTION OF MESH;  Surgeon: Jamesetta So, MD;  Location: [facility];  Service: General;  Laterality: Right;   Family History  Problem Relation Age of Onset  . Hypertension Mother   . Hyperlipidemia Mother   . Hypertension Father   . Hyperlipidemia Father   . Diabetes Daughter    Social History  Substance Use Topics  . Smoking status: Never Smoker   . Smokeless tobacco: Never Used  . Alcohol Use: No     Comment: 27 years alcohol free in AA    Review of Systems  Musculoskeletal: Negative for myalgias and arthralgias.  Skin: Positive for wound.  Neurological: Negative for weakness and numbness.  All other systems reviewed and are negative.     Allergies  Review of patient's allergies indicates no known allergies.  Home Medications   Prior to Admission medications   Medication Sig Start Date End Date Taking? Authorizing Provider  aspirin 325 MG  tablet Take 325 mg by mouth daily.    Historical Provider, MD  doxazosin (CARDURA) 4 MG tablet Take 4 mg by mouth daily.    Historical Provider, MD  Multiple Vitamin (MULTIVITAMIN) tablet Take 1 tablet by mouth daily.    Historical Provider, MD  pravastatin (PRAVACHOL) 80 MG tablet Take 80 mg by mouth daily.    Historical Provider, MD  Tamsulosin HCl (FLOMAX) 0.4 MG CAPS Take 0.4 mg by mouth daily.    Historical Provider, MD   BP 155/64 mmHg  Pulse 70  Temp(Src) 98.5 F (36.9 C) (Oral)  Resp 18  Ht 6\' 1"  (1.854 m)  Wt 185 lb (83.915 kg)  BMI 24.41 kg/m2  SpO2 100% Physical Exam  Constitutional: He is oriented to person, place, and time. He appears well-developed and well-nourished. No distress.  HENT:  Head: Normocephalic and atraumatic.  Mouth/Throat: Oropharynx is clear and moist.  Eyes: EOM are normal. Pupils are equal, round, and reactive to light.  Neck: Normal range of motion. Neck supple.  Cardiovascular: Normal rate and regular rhythm.   Pulmonary/Chest: Effort normal and breath sounds normal.  Abdominal: Soft. Bowel sounds are normal.  Musculoskeletal: Normal range of motion. He exhibits no edema or tenderness.  Neurological: He is alert and oriented to person, place, and time.  Moves all extremities without deficit. Sensation intact. Ambulating without difficulty.  Skin: Skin is warm and dry. No rash noted. No erythema.  Patient has small superficial laceration to the plantar surface of the left foot just proximal to the fourth digit. There is no tenderness to palpation. There is no mass appreciated. No active bleeding. Appears clean.  Psychiatric: He has a normal mood and affect. His behavior is normal.  Nursing note and vitals reviewed.   ED Course  Procedures (including critical care time) Labs Review Labs Reviewed - No data to display  Imaging Review No results found. I have personally reviewed and evaluated these images and lab results as part of my medical  decision-making.   EKG Interpretation None      MDM   Final diagnoses:  Puncture wound of plantar aspect of foot without complication, left, initial encounter    Patient appears to have very superficial puncture wound to the plantar surface of the left foot. Does not appear grossly contaminated. We will clean thoroughly in the emergency Department. Have advised keeping the area clean. He understands the need to return immediately if he develops any redness or warmth of the area. Tetanus is updated in the emergency department. Given superficial nature of the wound have suggested wound care and close observation for signs of infection. Patient voices understanding and agrees with plan.  Julianne Rice, MD 07/15/15 [phone]

## 2015-08-19 DIAGNOSIS — I129 Hypertensive chronic kidney disease with stage 1 through stage 4 chronic kidney disease, or unspecified chronic kidney disease: Secondary | ICD-10-CM | POA: Diagnosis not present

## 2015-08-19 DIAGNOSIS — C61 Malignant neoplasm of prostate: Secondary | ICD-10-CM | POA: Diagnosis not present

## 2015-08-19 DIAGNOSIS — E785 Hyperlipidemia, unspecified: Secondary | ICD-10-CM | POA: Diagnosis not present

## 2015-08-19 DIAGNOSIS — N183 Chronic kidney disease, stage 3 (moderate): Secondary | ICD-10-CM | POA: Diagnosis not present

## 2015-09-28 DIAGNOSIS — I129 Hypertensive chronic kidney disease with stage 1 through stage 4 chronic kidney disease, or unspecified chronic kidney disease: Secondary | ICD-10-CM | POA: Diagnosis not present

## 2015-09-28 DIAGNOSIS — I10 Essential (primary) hypertension: Secondary | ICD-10-CM | POA: Diagnosis not present

## 2015-09-28 DIAGNOSIS — R413 Other amnesia: Secondary | ICD-10-CM | POA: Diagnosis not present

## 2015-09-28 DIAGNOSIS — Z8673 Personal history of transient ischemic attack (TIA), and cerebral infarction without residual deficits: Secondary | ICD-10-CM | POA: Diagnosis not present

## 2015-09-29 ENCOUNTER — Other Ambulatory Visit (HOSPITAL_COMMUNITY): Payer: Self-pay | Admitting: Pulmonary Disease

## 2015-09-29 DIAGNOSIS — R413 Other amnesia: Secondary | ICD-10-CM | POA: Diagnosis not present

## 2015-09-29 DIAGNOSIS — I129 Hypertensive chronic kidney disease with stage 1 through stage 4 chronic kidney disease, or unspecified chronic kidney disease: Secondary | ICD-10-CM | POA: Diagnosis not present

## 2015-09-29 DIAGNOSIS — I10 Essential (primary) hypertension: Secondary | ICD-10-CM | POA: Diagnosis not present

## 2015-09-29 DIAGNOSIS — I6350 Cerebral infarction due to unspecified occlusion or stenosis of unspecified cerebral artery: Secondary | ICD-10-CM | POA: Diagnosis not present

## 2015-10-07 DIAGNOSIS — F0390 Unspecified dementia without behavioral disturbance: Secondary | ICD-10-CM | POA: Diagnosis not present

## 2015-10-07 DIAGNOSIS — I10 Essential (primary) hypertension: Secondary | ICD-10-CM | POA: Diagnosis not present

## 2015-10-07 DIAGNOSIS — N183 Chronic kidney disease, stage 3 (moderate): Secondary | ICD-10-CM | POA: Diagnosis not present

## 2015-10-07 DIAGNOSIS — I129 Hypertensive chronic kidney disease with stage 1 through stage 4 chronic kidney disease, or unspecified chronic kidney disease: Secondary | ICD-10-CM | POA: Diagnosis not present

## 2015-10-09 ENCOUNTER — Ambulatory Visit (HOSPITAL_COMMUNITY)
Admission: RE | Admit: 2015-10-09 | Discharge: 2015-10-09 | Disposition: A | Discharge status: Home / Self Care | Payer: Medicare Other | Source: Ambulatory Visit | Attending: Pulmonary Disease | Admitting: Pulmonary Disease

## 2015-10-09 DIAGNOSIS — I618 Other nontraumatic intracerebral hemorrhage: Secondary | ICD-10-CM | POA: Insufficient documentation

## 2015-10-09 DIAGNOSIS — G319 Degenerative disease of nervous system, unspecified: Secondary | ICD-10-CM | POA: Insufficient documentation

## 2015-10-09 DIAGNOSIS — R413 Other amnesia: Secondary | ICD-10-CM | POA: Diagnosis not present

## 2015-10-09 DIAGNOSIS — I6782 Cerebral ischemia: Secondary | ICD-10-CM | POA: Insufficient documentation

## 2015-10-23 ENCOUNTER — Encounter: Payer: Self-pay | Admitting: *Deleted

## 2015-10-26 ENCOUNTER — Ambulatory Visit (INDEPENDENT_AMBULATORY_CARE_PROVIDER_SITE_OTHER): Payer: Medicare Other | Admitting: Diagnostic Neuroimaging

## 2015-10-26 ENCOUNTER — Encounter: Payer: Self-pay | Admitting: Diagnostic Neuroimaging

## 2015-10-26 ENCOUNTER — Other Ambulatory Visit: Payer: Self-pay | Admitting: Diagnostic Neuroimaging

## 2015-10-26 VITALS — BP 129/73 | HR 64 | Ht 73.0 in | Wt 186.2 lb

## 2015-10-26 DIAGNOSIS — F03B0 Unspecified dementia, moderate, without behavioral disturbance, psychotic disturbance, mood disturbance, and anxiety: Secondary | ICD-10-CM

## 2015-10-26 DIAGNOSIS — F0390 Unspecified dementia without behavioral disturbance: Secondary | ICD-10-CM | POA: Diagnosis not present

## 2015-10-26 MED ORDER — MEMANTINE HCL 5 MG PO TABS: 5.0000 mg | ORAL_TABLET | Freq: Two times a day (BID) | ORAL | 0 refills | Status: DC

## 2015-10-26 MED ORDER — DONEPEZIL HCL 10 MG PO TABS: 10.0000 mg | ORAL_TABLET | Freq: Every day | ORAL | 12 refills | Status: DC

## 2015-10-26 MED ORDER — MEMANTINE HCL 10 MG PO TABS: 10.0000 mg | ORAL_TABLET | Freq: Two times a day (BID) | ORAL | 12 refills | Status: DC

## 2015-10-26 NOTE — Progress Notes (Signed)
[facility]  PATIENT: Eduardo Vasquez DOB: [birth year]  REFERRING CLINICIAN: Luan Pulling, E HISTORY FROM: patient and daughter  REASON FOR VISIT: new consult    HISTORICAL  CHIEF COMPLAINT:  Chief Complaint  Patient presents with  . Dementia    rm 6, New Pt, dgtr- [place], Alzheimer's Disease, MMSE 20    HISTORY OF PRESENT ILLNESS:   82 year old right-handed male here for evaluation of memory loss and dementia. Patient has history of hypercholesteremia, hypertension, prostate cancer. Patient reports some mild memory problems. He does not think this is excessive for age. Patient's daughter has noted at least 1-2 years of progressive short-term memory problems, asking same questions, decreased social activities, decreased interest in playing golf, taking more time to do his day-to-day routine. He still able to maintain his activities of daily living for the most part. Able to clean, perform yardwork, pay bills, drive and shop. Patient was diagnosed with possible dementia by PCP, started on donepezil and referred to me for further evaluation.  Patient tolerating donepezil 5 mg daily. No improvement or significant decline since starting. Patient has family history of Alzheimer's disease in his mother and maternal grandmother.   REVIEW OF SYSTEMS: Full 14 system review of systems performed and negative with exception of: Memory loss runny nose urination problems.  ALLERGIES: No Known Allergies  HOME MEDICATIONS: Outpatient Medications Prior to Visit  Medication Sig Dispense Refill  . aspirin 325 MG tablet Take 325 mg by mouth daily.    Marland Kitchen doxazosin (CARDURA) 4 MG tablet Take 4 mg by mouth daily.    . Multiple Vitamin (MULTIVITAMIN) tablet Take 1 tablet by mouth daily.    . pravastatin (PRAVACHOL) 80 MG tablet Take 80 mg by mouth daily.    . Tamsulosin HCl (FLOMAX) 0.4 MG CAPS Take 0.4 mg by mouth daily.     No facility-administered medications prior to visit.      PAST MEDICAL HISTORY: Past Medical History:  Diagnosis Date  . Cancer [facility]) 2009   Prostate  . Carotid artery occlusion   . Hypercholesteremia   . Hypertension   . Stroke [facility]) 2003   Mini - Left Eye- Partial Blindness  . Urgency incontinence     PAST SURGICAL HISTORY: Past Surgical History:  Procedure Laterality Date  . INGUINAL HERNIA REPAIR Right 12/02/2013   Procedure: HERNIA REPAIR INGUINAL ADULT WITH MESH;  Surgeon: Jamesetta So, MD;  Location: [facility];  Service: General;  Laterality: Right;  . INSERTION OF MESH Right 12/02/2013   Procedure: INSERTION OF MESH;  Surgeon: Jamesetta So, MD;  Location: [facility];  Service: General;  Laterality: Right;    FAMILY HISTORY: Family History  Problem Relation Age of Onset  . Hypertension Mother   . Hyperlipidemia Mother   . Hypertension Father   . Hyperlipidemia Father   . Diabetes Daughter   . Prostate cancer Brother   . Hypertension Brother     SOCIAL HISTORY:  Social History   Social History  . Marital status: Widowed    Spouse name: N/A  . Number of children: 2  . Years of education: 12   Occupational History  . retired     [place] Tobacco   Social History Main Topics  . Smoking status: Never Smoker  . Smokeless tobacco: Never Used  . Alcohol use No     Comment: 27 years alcohol free in AA  . Drug use: No  . Sexual activity: Yes    Birth control/ protection: None  Other Topics Concern  . Not on file   Social History Narrative   daughter stayting with patient for now   No caffeine     PHYSICAL EXAM  GENERAL EXAM/CONSTITUTIONAL: Vitals:  Vitals:   10/26/15 0847  BP: 129/73  Pulse: 64  Weight: 186 lb 3.2 oz (84.5 kg)  Height: 6\' 1"  (1.854 m)     Body mass index is 24.57 kg/m.  Visual Acuity Screening   Right eye Left eye Both eyes  Without correction:     With correction: 20/40 20/30      Patient is in no distress; well developed, nourished and groomed; neck is  supple  CARDIOVASCULAR:  Examination of carotid arteries is normal; no carotid bruits  Regular rate and rhythm, no murmurs  Examination of peripheral vascular system by observation and palpation is normal  EYES:  Ophthalmoscopic exam of optic discs and posterior segments is normal; no papilledema or hemorrhages  MUSCULOSKELETAL:  Gait, strength, tone, movements noted in Neurologic exam below  NEUROLOGIC: MENTAL STATUS:  MMSE - [place] Exam 10/26/2015  Orientation to time 3  Orientation to Place 4  Registration 3  Attention/ Calculation 0  Recall 0  Recall-comments "I think it's my birthday?"  Language- name 2 objects 2  Language- repeat 0  Language- follow 3 step command 3  Language- read & follow direction 1  Write a sentence 1  Copy design 0  Total score 17    awake, alert, oriented to person, place and time  recent and remote memory intact  normal attention and concentration  language fluent, comprehension intact, naming intact,   fund of knowledge appropriate  CRANIAL NERVE:   2nd - no papilledema on fundoscopic exam  2nd, 3rd, 4th, 6th - pupils equal and reactive to light, visual fields full to confrontation, extraocular muscles intact, no nystagmus  5th - facial sensation symmetric  7th - facial strength symmetric  8th - hearing intact  9th - palate elevates symmetrically, uvula midline  11th - shoulder shrug symmetric  12th - tongue protrusion midline  MOTOR:   normal bulk and tone, full strength in the BUE, BLE  SENSORY:   normal and symmetric to light touch, temperature, vibration  COORDINATION:   finger-nose-finger, fine finger movements normal  REFLEXES:   deep tendon reflexes present and symmetric  NO FRONTAL RELEASE SIGNS  GAIT/STATION:   narrow based gait; able to walk tandem; romberg is negative    DIAGNOSTIC DATA (LABS, IMAGING, TESTING) - I reviewed patient records, labs, notes, testing and imaging  myself where available.  Lab Results  Component Value Date   WBC 5.6 11/27/2013   HGB 12.0 (L) 11/27/2013   HCT 36.5 (L) 11/27/2013   MCV 83.1 11/27/2013   PLT 171 11/27/2013      Component Value Date/Time   NA 142 11/27/2013 1255   K 4.2 11/27/2013 1255   CL 104 11/27/2013 1255   CO2 28 11/27/2013 1255   GLUCOSE 102 (H) 11/27/2013 1255   BUN 28 (H) 11/27/2013 1255   CREATININE 1.55 (H) 11/27/2013 1255   CALCIUM 9.0 11/27/2013 1255   GFRNONAA 40 (L) 11/27/2013 1255   GFRAA 47 (L) 11/27/2013 1255   No results found for: CHOL, HDL, LDLCALC, LDLDIRECT, TRIG, CHOLHDL No results found for: HGBA1C No results found for: VITAMINB12 No results found for: TSH   10/09/15 MRI brain [I reviewed images myself and agree with interpretation. The right occipital sulcal hemosiderin could be related to amyloid angiopathy. -[facility]P]  -  Atrophy and moderate chronic microvascular ischemia. - No acute abnormality - Chronic hemorrhage in the right occipital parietal lobe likely related to a developmental venous anomaly which has bled in the past.     ASSESSMENT AND PLAN  82 y.o. year old male here with progressive short-term memory problems, cognitive decline over past 1-2 years (since 2015) with signs and symptoms consistent with neurodegenerative dementia. Also with concomitant depression, which can contribute to cognitive difficulties.   DDx: moderate dementia (alzheimer's vs vascular) vs pseudo-dementia of depression  1. Moderate dementia without behavioral disturbance      PLAN: - increase donepezil to 10mg  daily - add memantine 5mg  twice a day; after 2 weeks increase to 10mg  twice a day - continue BP control - stay active (mentally, socially and physically) - safety and supervision issues reviewed - continue advanced care planning (patient and family have already started process)  Meds ordered this encounter  Medications  . donepezil (ARICEPT) 10 MG tablet    Sig: Take 1 tablet  (10 mg total) by mouth at bedtime.    Dispense:  30 tablet    Refill:  12  . memantine (NAMENDA) 5 MG tablet    Sig: Take 1 tablet (5 mg total) by mouth 2 (two) times daily.    Dispense:  30 tablet    Refill:  0  . memantine (NAMENDA) 10 MG tablet    Sig: Take 1 tablet (10 mg total) by mouth 2 (two) times daily.    Dispense:  60 tablet    Refill:  12   Return in about 3 months (around 01/26/2016).    Penni Bombard, MD [id number], [id number] AM Certified in Neurology, Neurophysiology and Neuroimaging  [facility] [address], [place], [place] [address] [phone]

## 2015-10-26 NOTE — Patient Instructions (Signed)
Thank you for coming to see [place] at [facility]. I hope we have been able to provide you high quality care today.  You may receive a patient satisfaction survey over the next few weeks. We would appreciate your feedback and comments so that we may continue to improve ourselves and the health of our patients.  - increase donepezil to 1m daily - add memantine [id number] a day; after 2 weeks increase to [id number] a day - continue BP control - stay active (mentally, socially and physically) - monitor for safety and supervision issues including driving safety - continue advanced care planning and advanced directives    ~~~~~~~~~~~~~~~~~~~~~~~~~~~~~~~~~~~~~~~~~~~~~~~~~~~~~~~~~~~~~~~~~  DR. Joneric Streight'S GUIDE TO HAPPY AND HEALTHY LIVING These are some of my general health and wellness recommendations. Some of them may apply to you better than others. Please use common sense as you try these suggestions and feel free to ask me any questions.   ACTIVITY/FITNESS Mental, social, emotional and physical stimulation are very important for brain and body health. Try learning a new activity (arts, music, language, sports, games).  Keep moving your body to the best of your abilities. You can do this at home, inside or outside, the park, community center, gym or anywhere you like. Consider a physical therapist or personal trainer to get started. Consider the app Sworkit. Fitness trackers such as smart-watches, smart-phones or Fitbits can help as well.   NUTRITION Eat more plants: colorful vegetables, nuts, seeds and berries.  Eat less sugar, salt, preservatives and processed foods.  Avoid toxins such as cigarettes and alcohol.  Drink water when you are thirsty. Warm water with a slice of lemon is an excellent morning drink to start the day.  Consider these websites for more information The Nutrition Source (htthttps://www.henry-hernandez.biz/recision Nutrition  (wwwWindowBlog.ch RELAXATION Consider practicing mindfulness meditation or other relaxation techniques such as deep breathing, prayer, yoga, tai chi, massage. See website mindful.org or the apps Headspace or Calm to help get started.   SLEEP Try to get at least 7-8+ hours sleep per day. Regular exercise and reduced caffeine will help you sleep better. Practice good sleep hygeine techniques. See website sleep.org for more information.   PLANNING Prepare estate planning, living will, healthcare POA documents. Sometimes this is best planned with the help of an attorney. Theconversationproject.org and agingwithdignity.org are excellent resources.

## 2015-11-06 ENCOUNTER — Other Ambulatory Visit: Payer: Self-pay

## 2015-11-20 ENCOUNTER — Encounter: Payer: Self-pay | Admitting: Family

## 2015-11-26 ENCOUNTER — Ambulatory Visit (INDEPENDENT_AMBULATORY_CARE_PROVIDER_SITE_OTHER): Payer: Medicare Other | Admitting: Family

## 2015-11-26 ENCOUNTER — Encounter: Payer: Self-pay | Admitting: Family

## 2015-11-26 ENCOUNTER — Ambulatory Visit (HOSPITAL_COMMUNITY)
Admission: RE | Admit: 2015-11-26 | Discharge: 2015-11-26 | Disposition: A | Discharge status: Home / Self Care | Payer: Medicare Other | Source: Ambulatory Visit | Attending: Family | Admitting: Family

## 2015-11-26 VITALS — BP 129/74 | HR 56 | Temp 97.7°F | Resp 16 | Ht 73.0 in | Wt 185.0 lb

## 2015-11-26 DIAGNOSIS — Z8673 Personal history of transient ischemic attack (TIA), and cerebral infarction without residual deficits: Secondary | ICD-10-CM

## 2015-11-26 DIAGNOSIS — I6523 Occlusion and stenosis of bilateral carotid arteries: Secondary | ICD-10-CM | POA: Diagnosis not present

## 2015-11-26 NOTE — Patient Instructions (Signed)
Stroke Prevention Some medical conditions and behaviors are associated with an increased chance of having a stroke. You may prevent a stroke by making healthy choices and managing medical conditions. HOW CAN I REDUCE MY RISK OF HAVING A STROKE?   Stay physically active. Get at least 30 minutes of activity on most or all days.  Do not smoke. It may also be helpful to avoid exposure to secondhand smoke.  Limit alcohol use. Moderate alcohol use is considered to be:  No more than 2 drinks per day for men.  No more than 1 drink per day for nonpregnant women.  Eat healthy foods. This involves:  Eating 5 or more servings of fruits and vegetables a day.  Making dietary changes that address high blood pressure (hypertension), high cholesterol, diabetes, or obesity.  Manage your cholesterol levels.  Making food choices that are high in fiber and low in saturated fat, trans fat, and cholesterol may control cholesterol levels.  Take any prescribed medicines to control cholesterol as directed by your health care provider.  Manage your diabetes.  Controlling your carbohydrate and sugar intake is recommended to manage diabetes.  Take any prescribed medicines to control diabetes as directed by your health care provider.  Control your hypertension.  Making food choices that are low in salt (sodium), saturated fat, trans fat, and cholesterol is recommended to manage hypertension.  Ask your health care provider if you need treatment to lower your blood pressure. Take any prescribed medicines to control hypertension as directed by your health care provider.  If you are 18-39 years of age, have your blood pressure checked every 3-5 years. If you are 40 years of age or older, have your blood pressure checked every year.  Maintain a healthy weight.  Reducing calorie intake and making food choices that are low in sodium, saturated fat, trans fat, and cholesterol are recommended to manage  weight.  Stop drug abuse.  Avoid taking birth control pills.  Talk to your health care provider about the risks of taking birth control pills if you are over 35 years old, smoke, get migraines, or have ever had a blood clot.  Get evaluated for sleep disorders (sleep apnea).  Talk to your health care provider about getting a sleep evaluation if you snore a lot or have excessive sleepiness.  Take medicines only as directed by your health care provider.  For some people, aspirin or blood thinners (anticoagulants) are helpful in reducing the risk of forming abnormal blood clots that can lead to stroke. If you have the irregular heart rhythm of atrial fibrillation, you should be on a blood thinner unless there is a good reason you cannot take them.  Understand all your medicine instructions.  Make sure that other conditions (such as anemia or atherosclerosis) are addressed. SEEK IMMEDIATE MEDICAL CARE IF:   You have sudden weakness or numbness of the face, arm, or leg, especially on one side of the body.  Your face or eyelid droops to one side.  You have sudden confusion.  You have trouble speaking (aphasia) or understanding.  You have sudden trouble seeing in one or both eyes.  You have sudden trouble walking.  You have dizziness.  You have a loss of balance or coordination.  You have a sudden, severe headache with no known cause.  You have new chest pain or an irregular heartbeat. Any of these symptoms may represent a serious problem that is an emergency. Do not wait to see if the symptoms will   go away. Get medical help at once. Call your local emergency services (911 in [place] Do not drive yourself to the hospital.   This information is not intended to replace advice given to you by your health care provider. Make sure you discuss any questions you have with your health care provider.   Document Released: 04/07/2004 Document Revised: 03/21/2014 Document Reviewed:  08/31/2012 Elsevier Interactive Patient Education 2016 Elsevier Inc.  

## 2015-11-26 NOTE — Progress Notes (Signed)
Chief Complaint: Follow up Extracranial Carotid Artery Stenosis   History of Present Illness  Eduardo Vasquez is a 82 y.o. male patient of Dr. Oneida Alar who has known carotid stenosis with a history of stroke in 2003, his only symptom was left eye partial upper visual field loss and this deficit remains. He has had no further stroke or TIA symptoms.  He returns today for follow up.  Patient has not had previous carotid artery intervention. The patient denies a history of unilateral facial drooping, hemiplegia, or receptive or expressive aphasia.  He reports that he had a right inguinal hernia repair in September 2015. He plays a lot of golf, states he has no claudication symptoms with walking.  Pt Diabetic: No  Pt smoker: No   Pt meds include:  Statin : Yes  Betablocker: No  ASA: Yes  Other anticoagulants/antiplatelets: no   Past Medical History:  Diagnosis Date  . Cancer [facility]) 2009   Prostate  . Carotid artery occlusion   . Hypercholesteremia   . Hypertension   . Stroke [facility]) 2003   Mini - Left Eye- Partial Blindness  . Urgency incontinence     Social History Social History  Substance Use Topics  . Smoking status: Never Smoker  . Smokeless tobacco: Never Used  . Alcohol use No     Comment: 27 years alcohol free in AA    Family History Family History  Problem Relation Age of Onset  . Hypertension Mother   . Hyperlipidemia Mother   . Hypertension Father   . Hyperlipidemia Father   . Diabetes Daughter   . Prostate cancer Brother   . Hypertension Brother     Surgical History Past Surgical History:  Procedure Laterality Date  . INGUINAL HERNIA REPAIR Right 12/02/2013   Procedure: HERNIA REPAIR INGUINAL ADULT WITH MESH;  Surgeon: Jamesetta So, MD;  Location: [facility];  Service: General;  Laterality: Right;  . INSERTION OF MESH Right 12/02/2013   Procedure: INSERTION OF MESH;  Surgeon: Jamesetta So, MD;  Location: [facility];  Service: General;   Laterality: Right;    No Known Allergies  Current Outpatient Prescriptions  Medication Sig Dispense Refill  . amLODipine (NORVASC) 10 MG tablet 10 mg daily.    Marland Kitchen aspirin EC 81 MG tablet Take 81 mg by mouth daily.    Marland Kitchen doxazosin (CARDURA) 4 MG tablet Take 4 mg by mouth daily.    . memantine (NAMENDA) 10 MG tablet Take 1 tablet (10 mg total) by mouth 2 (two) times daily. 60 tablet 12  . Multiple Vitamin (MULTIVITAMIN) tablet Take 1 tablet by mouth daily.    . pravastatin (PRAVACHOL) 80 MG tablet Take 80 mg by mouth daily.    . Tamsulosin HCl (FLOMAX) 0.4 MG CAPS Take 0.4 mg by mouth daily.    Marland Kitchen aspirin 325 MG tablet Take 325 mg by mouth daily.    Marland Kitchen donepezil (ARICEPT) 10 MG tablet Take 1 tablet (10 mg total) by mouth at bedtime. (Patient taking differently: Take 5 mg by mouth at bedtime. ) 30 tablet 12  . memantine (NAMENDA) 5 MG tablet Take 1 tablet (5 mg total) by mouth 2 (two) times daily. (Patient not taking: Reported on 11/26/2015) 30 tablet 0   No current facility-administered medications for this visit.     Review of Systems : See HPI for pertinent positives and negatives.  Physical Examination  Vitals:   11/26/15 1053 11/26/15 1057  BP: 135/74 129/74  Pulse: (!) 56 (!)  56  Resp: 16   Temp: 97.7 F (36.5 C)   SpO2: 98%   Weight: 185 lb (83.9 kg)   Height: 6\' 1"  (1.854 m)    Body mass index is 24.41 kg/m.  General: WDWN male in NAD  GAIT: normal  Eyes: PERRLA, bilateral arcus senilus Pulmonary: Respirations are non labored, CTAB, good air movement. Cardiac: regularly rhythm and rate, no detected murmur  Vascular:  Vessel  Right  Left   Radial  Palpable  Palpable   Carotid  audible, without bruit  audible, without bruit   Aorta  Not palpable  N/A   Popliteal  palpable  palpable   PT  Palpable  Palpable   DP  Palpable  Palpable    Gastrointestinal: soft, nontender, BS WNL, no r/g, no palpable masses;  Musculoskeletal: No muscle  atrophy/wasting. M/S 5/5 throughout, Extremities without ischemic changes.  Neurologic: A&O X 3; Appropriate Affect, MOTOR FUNCTION: normal 5/5 throughout. Speech is normal  CN 2-12 intact, Pain and light touch intact in extremities, Motor exam as listed above    Assessment: Eduardo Vasquez is a 82 y.o. male who has a history of a left ocular stroke in 2003, no subsequent strokes or TIA's. Fortunately he has never used tobacco and does not have DM.   DATA Today's carotid Duplex suggests  <40% bilateral internal carotid artery stenosis. Both vertebral arteries are antegrade, both subclavian arteries are multiphasic. No significant change in comparison to the last exam on 11/07/2013 and 11/20/14.     Plan: Follow-up in 18 months with Carotid Duplex scan.   I discussed in depth with the patient the nature of atherosclerosis, and emphasized the importance of maximal medical management including strict control of blood pressure, blood glucose, and lipid levels, obtaining regular exercise, and continued cessation of smoking.  The patient is aware that without maximal medical management the underlying atherosclerotic disease process will progress, limiting the benefit of any interventions. The patient was given information about stroke prevention and what symptoms should prompt the patient to seek immediate medical care. Thank you for allowing [place] to participate in this patient's care.  Clemon Chambers, RN, MSN, FNP-C [facility] Office: [facility] Physician: [facility]  11/26/15 11:02 AM

## 2015-12-18 DIAGNOSIS — Z23 Encounter for immunization: Secondary | ICD-10-CM | POA: Diagnosis not present

## 2016-01-26 ENCOUNTER — Encounter: Payer: Self-pay | Admitting: Diagnostic Neuroimaging

## 2016-01-26 ENCOUNTER — Ambulatory Visit (INDEPENDENT_AMBULATORY_CARE_PROVIDER_SITE_OTHER): Payer: Medicare Other | Admitting: Diagnostic Neuroimaging

## 2016-01-26 VITALS — BP 124/76 | HR 72 | Wt 184.8 lb

## 2016-01-26 DIAGNOSIS — F0390 Unspecified dementia without behavioral disturbance: Secondary | ICD-10-CM | POA: Diagnosis not present

## 2016-01-26 DIAGNOSIS — I6523 Occlusion and stenosis of bilateral carotid arteries: Secondary | ICD-10-CM

## 2016-01-26 DIAGNOSIS — F03B0 Unspecified dementia, moderate, without behavioral disturbance, psychotic disturbance, mood disturbance, and anxiety: Secondary | ICD-10-CM

## 2016-01-26 MED ORDER — MEMANTINE HCL 10 MG PO TABS: 10.0000 mg | ORAL_TABLET | Freq: Two times a day (BID) | ORAL | 12 refills | Status: DC

## 2016-01-26 MED ORDER — DONEPEZIL HCL 10 MG PO TABS: 10.0000 mg | ORAL_TABLET | Freq: Every day | ORAL | 12 refills | Status: DC

## 2016-01-26 NOTE — Progress Notes (Signed)
[facility]  PATIENT: Eduardo Vasquez DOB: 1929/07/03  REFERRING CLINICIAN: Luan Pulling, E HISTORY FROM: patient and daughter  REASON FOR VISIT: follow up   HISTORICAL  CHIEF COMPLAINT:  Chief Complaint  Patient presents with  . Dementia    rm 7, dgtr- Portia, MMSE 19  . Follow-up    3 month    HISTORY OF PRESENT ILLNESS:   UPDATE 01/26/16: Since last visit, doing well. Tolerating meds (donepezil 10mg  daily + memantine 10mg  BID). Daughter helping with meds. Overall no new issues.   PRIOR HPI (10/26/15):  82 year old right-handed male here for evaluation of memory loss and dementia. Patient has history of hypercholesteremia, hypertension, prostate cancer. Patient reports some mild memory problems. He does not think this is excessive for age. Patient's daughter has noted at least 1-2 years of progressive short-term memory problems, asking same questions, decreased social activities, decreased interest in playing golf, taking more time to do his day-to-day routine. He still able to maintain his activities of daily living for the most part. Able to clean, perform yardwork, pay bills, drive and shop. Patient was diagnosed with possible dementia by PCP, started on donepezil and referred to me for further evaluation. Patient tolerating donepezil 5 mg daily. No improvement or significant decline since starting. Patient has family history of Alzheimer's disease in his mother and maternal grandmother.   REVIEW OF SYSTEMS: Full 14 system review of systems performed and negative with exception of: Memory loss.  ALLERGIES: No Known Allergies  HOME MEDICATIONS: Outpatient Medications Prior to Visit  Medication Sig Dispense Refill  . amLODipine (NORVASC) 10 MG tablet 10 mg daily.    Marland Kitchen aspirin EC 81 MG tablet Take 81 mg by mouth daily.    Marland Kitchen donepezil (ARICEPT) 10 MG tablet Take 1 tablet (10 mg total) by mouth at bedtime. (Patient taking differently: Take 5 mg by mouth at  bedtime. ) 30 tablet 12  . doxazosin (CARDURA) 4 MG tablet Take 4 mg by mouth daily.    . memantine (NAMENDA) 10 MG tablet Take 1 tablet (10 mg total) by mouth 2 (two) times daily. 60 tablet 12  . Multiple Vitamin (MULTIVITAMIN) tablet Take 1 tablet by mouth daily.    . pravastatin (PRAVACHOL) 80 MG tablet Take 80 mg by mouth daily.    . Tamsulosin HCl (FLOMAX) 0.4 MG CAPS Take 0.4 mg by mouth daily.    Marland Kitchen aspirin 325 MG tablet Take 325 mg by mouth daily.    . memantine (NAMENDA) 5 MG tablet Take 1 tablet (5 mg total) by mouth 2 (two) times daily. (Patient not taking: Reported on 11/26/2015) 30 tablet 0   No facility-administered medications prior to visit.     PAST MEDICAL HISTORY: Past Medical History:  Diagnosis Date  . Cancer [facility]) 2009   Prostate  . Carotid artery occlusion   . Dementia   . Hypercholesteremia   . Hypertension   . Stroke [facility]) 2003   Mini - Left Eye- Partial Blindness  . Urgency incontinence     PAST SURGICAL HISTORY: Past Surgical History:  Procedure Laterality Date  . INGUINAL HERNIA REPAIR Right 12/02/2013   Procedure: HERNIA REPAIR INGUINAL ADULT WITH MESH;  Surgeon: Jamesetta So, MD;  Location: [facility];  Service: General;  Laterality: Right;  . INSERTION OF MESH Right 12/02/2013   Procedure: INSERTION OF MESH;  Surgeon: Jamesetta So, MD;  Location: [facility];  Service: General;  Laterality: Right;    FAMILY HISTORY: Family History  Problem Relation Age of Onset  . Hypertension Mother   . Hyperlipidemia Mother   . Hypertension Father   . Hyperlipidemia Father   . Diabetes Daughter   . Prostate cancer Brother   . Hypertension Brother     SOCIAL HISTORY:  Social History   Social History  . Marital status: Widowed    Spouse name: N/A  . Number of children: 2  . Years of education: 12   Occupational History  . retired     [place] Tobacco   Social History Main Topics  . Smoking status: Never Smoker  . Smokeless tobacco: Never Used  .  Alcohol use No     Comment: 27 years alcohol free in [facility]  . Drug use: No  . Sexual activity: Yes    Birth control/ protection: None   Other Topics Concern  . Not on file   Social History Narrative   01/26/16 daughter stayting with patient for now   No caffeine     PHYSICAL EXAM  GENERAL EXAM/CONSTITUTIONAL: Vitals:  Vitals:   01/26/16 1020  BP: 124/76  Pulse: 72  Weight: 184 lb 12.8 oz (83.8 kg)   Body mass index is 24.38 kg/m. No exam data present  Patient is in no distress; well developed, nourished and groomed; neck is supple  CARDIOVASCULAR:  Examination of carotid arteries is normal; no carotid bruits  Regular rate and rhythm, no murmurs  Examination of peripheral vascular system by observation and palpation is normal  EYES:  Ophthalmoscopic exam of optic discs and posterior segments is normal; no papilledema or hemorrhages  MUSCULOSKELETAL:  Gait, strength, tone, movements noted in Neurologic exam below  NEUROLOGIC: MENTAL STATUS:  MMSE - Mini Mental State Exam 01/26/2016 10/26/2015  Orientation to time 3 3  Orientation to Place 5 4  Registration 3 3  Attention/ Calculation 2 0  Recall 0 0  Recall-comments - "I think it's my birthday?"  Language- name 2 objects 2 2  Language- repeat 0 0  Language- follow 3 step command 3 3  Language- read & follow direction 1 1  Write a sentence 0 1  Copy design 0 0  Total score 19 17    awake, alert, oriented to person, place and time; NOT Leeper attention and concentration  language fluent, comprehension intact, naming intact,   fund of knowledge appropriate  CRANIAL NERVE:   2nd - no papilledema on fundoscopic exam  2nd, 3rd, 4th, 6th - pupils equal and reactive to light, visual fields full to confrontation, extraocular muscles intact, no nystagmus  5th - facial sensation symmetric  7th - facial strength symmetric  8th - hearing intact  9th - palate elevates  symmetrically, uvula midline  11th - shoulder shrug symmetric  12th - tongue protrusion midline  MOTOR:   normal bulk and tone, full strength in the BUE, BLE  SENSORY:   normal and symmetric to light touch, temperature, vibration  COORDINATION:   finger-nose-finger, fine finger movements normal  REFLEXES:   deep tendon reflexes present and symmetric  NO FRONTAL RELEASE SIGNS  GAIT/STATION:   narrow based gait; able to walk tandem; romberg is negative    DIAGNOSTIC DATA (LABS, IMAGING, TESTING) - I reviewed patient records, labs, notes, testing and imaging myself where available.  Lab Results  Component Value Date   WBC 5.6 11/27/2013   HGB 12.0 (L) 11/27/2013   HCT 36.5 (L) 11/27/2013   MCV 83.1 11/27/2013   PLT  171 11/27/2013      Component Value Date/Time   NA 142 11/27/2013 1255   K 4.2 11/27/2013 1255   CL 104 11/27/2013 1255   CO2 28 11/27/2013 1255   GLUCOSE 102 (H) 11/27/2013 1255   BUN 28 (H) 11/27/2013 1255   CREATININE 1.55 (H) 11/27/2013 1255   CALCIUM 9.0 11/27/2013 1255   GFRNONAA 40 (L) 11/27/2013 1255   GFRAA 47 (L) 11/27/2013 1255   No results found for: CHOL, HDL, LDLCALC, LDLDIRECT, TRIG, CHOLHDL No results found for: HGBA1C No results found for: VITAMINB12 No results found for: TSH   10/09/15 MRI brain [I reviewed images myself and agree with interpretation. The right occipital sulcal hemosiderin could be related to amyloid angiopathy. -VRP]  - Atrophy and moderate chronic microvascular ischemia. - No acute abnormality - Chronic hemorrhage in the right occipital parietal lobe likely related to a developmental venous anomaly which has bled in the past.     ASSESSMENT AND PLAN  82 y.o. year old male here with progressive short-term memory problems, cognitive decline over past since 2015 with signs and symptoms consistent with neurodegenerative dementia.    Dx: moderate dementia (alzheimer's vs vascular)  1. Moderate dementia  without behavioral disturbance      PLAN: I spent 15 minutes of face to face time with patient. Greater than 50% of time was spent in counseling and coordination of care with patient. In summary we discussed:  - continue donepezil 10mg  daily + memantine 10mg  twice a day (future refills per PCP) - continue BP control - stay active (mentally, socially and physically) - safety and supervision issues reviewed - continue advanced care planning (patient and family have already started process) - return to PCP; follow up here as needed  Meds ordered this encounter  Medications  . donepezil (ARICEPT) 10 MG tablet    Sig: Take 1 tablet (10 mg total) by mouth at bedtime.    Dispense:  30 tablet    Refill:  12  . memantine (NAMENDA) 10 MG tablet    Sig: Take 1 tablet (10 mg total) by mouth 2 (two) times daily.    Dispense:  60 tablet    Refill:  12   Return if symptoms worsen or fail to improve, for return to PCP.    Penni Bombard, MD [id number], 0000000 AM Certified in Neurology, Neurophysiology and Neuroimaging  [facility] [address] [place], [place] [address] [phone]

## 2016-02-10 DIAGNOSIS — I10 Essential (primary) hypertension: Secondary | ICD-10-CM | POA: Diagnosis not present

## 2016-02-10 DIAGNOSIS — N183 Chronic kidney disease, stage 3 (moderate): Secondary | ICD-10-CM | POA: Diagnosis not present

## 2016-02-10 DIAGNOSIS — C61 Malignant neoplasm of prostate: Secondary | ICD-10-CM | POA: Diagnosis not present

## 2016-02-10 DIAGNOSIS — F0390 Unspecified dementia without behavioral disturbance: Secondary | ICD-10-CM | POA: Diagnosis not present

## 2016-02-17 NOTE — Addendum Note (Signed)
Addended by: Lianne Cure A on: 02/17/2016 03:26 PM   Modules accepted: Orders

## 2016-04-15 DIAGNOSIS — Z23 Encounter for immunization: Secondary | ICD-10-CM | POA: Diagnosis not present

## 2016-06-08 DIAGNOSIS — I129 Hypertensive chronic kidney disease with stage 1 through stage 4 chronic kidney disease, or unspecified chronic kidney disease: Secondary | ICD-10-CM | POA: Diagnosis not present

## 2016-06-08 DIAGNOSIS — I10 Essential (primary) hypertension: Secondary | ICD-10-CM | POA: Diagnosis not present

## 2016-06-08 DIAGNOSIS — F0390 Unspecified dementia without behavioral disturbance: Secondary | ICD-10-CM | POA: Diagnosis not present

## 2016-06-08 DIAGNOSIS — N183 Chronic kidney disease, stage 3 (moderate): Secondary | ICD-10-CM | POA: Diagnosis not present

## 2016-09-21 DIAGNOSIS — Z0000 Encounter for general adult medical examination without abnormal findings: Secondary | ICD-10-CM | POA: Diagnosis not present

## 2016-09-21 DIAGNOSIS — Z23 Encounter for immunization: Secondary | ICD-10-CM | POA: Diagnosis not present

## 2016-10-26 ENCOUNTER — Other Ambulatory Visit: Payer: Self-pay | Admitting: Diagnostic Neuroimaging

## 2017-01-18 DIAGNOSIS — H2513 Age-related nuclear cataract, bilateral: Secondary | ICD-10-CM | POA: Diagnosis not present

## 2017-01-18 DIAGNOSIS — H401133 Primary open-angle glaucoma, bilateral, severe stage: Secondary | ICD-10-CM | POA: Diagnosis not present

## 2017-02-24 ENCOUNTER — Other Ambulatory Visit: Payer: Self-pay | Admitting: Diagnostic Neuroimaging

## 2017-02-27 ENCOUNTER — Other Ambulatory Visit: Payer: Self-pay | Admitting: Diagnostic Neuroimaging

## 2017-02-27 NOTE — Telephone Encounter (Signed)
Future refills through PCP. Patient will be seen again if needed.

## 2017-03-25 ENCOUNTER — Other Ambulatory Visit: Payer: Self-pay | Admitting: Diagnostic Neuroimaging

## 2017-03-27 DIAGNOSIS — I10 Essential (primary) hypertension: Secondary | ICD-10-CM | POA: Diagnosis not present

## 2017-03-27 DIAGNOSIS — F0390 Unspecified dementia without behavioral disturbance: Secondary | ICD-10-CM | POA: Diagnosis not present

## 2017-03-27 DIAGNOSIS — E785 Hyperlipidemia, unspecified: Secondary | ICD-10-CM | POA: Diagnosis not present

## 2017-03-28 DIAGNOSIS — E785 Hyperlipidemia, unspecified: Secondary | ICD-10-CM | POA: Diagnosis not present

## 2017-03-28 DIAGNOSIS — I10 Essential (primary) hypertension: Secondary | ICD-10-CM | POA: Diagnosis not present

## 2017-04-04 DIAGNOSIS — Z23 Encounter for immunization: Secondary | ICD-10-CM | POA: Diagnosis not present

## 2017-05-26 ENCOUNTER — Ambulatory Visit (HOSPITAL_COMMUNITY)
Admission: RE | Admit: 2017-05-26 | Discharge: 2017-05-26 | Disposition: A | Discharge status: Home / Self Care | Payer: Medicare Other | Source: Ambulatory Visit | Attending: Family | Admitting: Family

## 2017-05-26 ENCOUNTER — Other Ambulatory Visit: Payer: Self-pay

## 2017-05-26 ENCOUNTER — Ambulatory Visit (INDEPENDENT_AMBULATORY_CARE_PROVIDER_SITE_OTHER): Payer: Medicare Other | Admitting: Family

## 2017-05-26 ENCOUNTER — Encounter: Payer: Self-pay | Admitting: Family

## 2017-05-26 VITALS — BP 150/76 | HR 65 | Temp 98.5°F | Resp 16 | Ht 73.0 in | Wt 192.0 lb

## 2017-05-26 DIAGNOSIS — I6523 Occlusion and stenosis of bilateral carotid arteries: Secondary | ICD-10-CM | POA: Diagnosis not present

## 2017-05-26 DIAGNOSIS — Z8673 Personal history of transient ischemic attack (TIA), and cerebral infarction without residual deficits: Secondary | ICD-10-CM | POA: Diagnosis not present

## 2017-05-26 NOTE — Progress Notes (Signed)
Chief Complaint: Follow up Extracranial Carotid Artery Stenosis   History of Present Illness  Eduardo Vasquez is a 84 y.o. male whom Dr. Oneida Alar has been monitoring for carotid stenosis; he has a history of stroke in 2003, his only symptom was left eye partial upper visual field loss and this deficit remains. He has had no further stroke or TIA symptoms.   He has not had previous carotid artery intervention. The patient denies a history of unilateral facial drooping, hemiplegia, or receptive or expressive aphasia.  He had a right inguinal hernia repair in September 2015. He plays a lot of golf, states he has no claudication symptoms with walking.   Pt Diabetic: No Pt smoker: No  Pt meds include:  Statin : Yes  Betablocker: No  ASA: Yes  Other anticoagulants/antiplatelets: no   Past Medical History:  Diagnosis Date  . Cancer [facility]) 2009   Prostate  . Carotid artery occlusion   . Dementia   . Hypercholesteremia   . Hypertension   . Stroke [facility]) 2003   Mini - Left Eye- Partial Blindness  . Urgency incontinence     Social History Social History   Tobacco Use  . Smoking status: Never Smoker  . Smokeless tobacco: Never Used  Substance Use Topics  . Alcohol use: No    Alcohol/week: 0.0 oz    Comment: 27 years alcohol free in AA  . Drug use: No    Family History Family History  Problem Relation Age of Onset  . Hypertension Mother   . Hyperlipidemia Mother   . Hypertension Father   . Hyperlipidemia Father   . Diabetes Daughter   . Prostate cancer Brother   . Hypertension Brother     Surgical History Past Surgical History:  Procedure Laterality Date  . INGUINAL HERNIA REPAIR Right 12/02/2013   Procedure: HERNIA REPAIR INGUINAL ADULT WITH MESH;  Surgeon: Jamesetta So, MD;  Location: [facility] ORS;  Service: General;  Laterality: Right;  . INSERTION OF MESH Right 12/02/2013   Procedure: INSERTION OF MESH;  Surgeon: Jamesetta So, MD;  Location: [facility]  ORS;  Service: General;  Laterality: Right;    No Known Allergies  Current Outpatient Medications  Medication Sig Dispense Refill  . amLODipine (NORVASC) 10 MG tablet 10 mg daily.    Marland Kitchen aspirin EC 81 MG tablet Take 81 mg by mouth daily.    Marland Kitchen donepezil (ARICEPT) 10 MG tablet TAKE 1 TABLET(10 MG) BY MOUTH AT BEDTIME 30 tablet 0  . doxazosin (CARDURA) 4 MG tablet Take 4 mg by mouth daily.    Marland Kitchen LUMIGAN 0.01 % SOLN As needed  12  . memantine (NAMENDA) 10 MG tablet Take 1 tablet (10 mg total) by mouth 2 (two) times daily. 60 tablet 12  . Multiple Vitamin (MULTIVITAMIN) tablet Take 1 tablet by mouth daily.    . pravastatin (PRAVACHOL) 80 MG tablet Take 80 mg by mouth daily.    . Tamsulosin HCl (FLOMAX) 0.4 MG CAPS Take 0.4 mg by mouth daily.     No current facility-administered medications for this visit.     Review of Systems : See HPI for pertinent positives and negatives.  Physical Examination  Vitals:   05/26/17 1047 05/26/17 1048  BP: (!) 150/83 (!) 150/76  Pulse: 65   Resp: 16   Temp: 98.5 F (36.9 C)   TempSrc: Oral   SpO2: 100%   Weight: 192 lb (87.1 kg)   Height: 6\' 1"  (1.854 m)  Body mass index is 25.33 kg/m.  General: WDWN male in NAD  HENT: No gross abnormalities  GAIT:normal  Eyes: PERRLA, bilateral arcus senilus Pulmonary: Respirations are non labored, CTAB, good air movement in all fields. Cardiac: regularly rhythm and rate, no detected murmur  Vascular:  Vessel  Right  Left   Radial  Palpable  Palpable   Carotid  audible, without bruit  audible, without bruit   Aorta  Not palpable  N/A   Popliteal  Not palpable  Not palpable   PT  Palpable  Palpable   DP  Palpable  Palpable    Gastrointestinal: soft, nontender, BS WNL, no r/g, no palpable masses;  Musculoskeletal: No muscle atrophy/wasting. M/S 5/5 throughout, extremities without ischemic changes. Skin: No rashes, no ulcers, no cellulitis.   Neurologic:  A&O X 3;  appropriate affect, sensation is normal; speech is normal, CN 2-12 intact, pain and light touch intact in extremities, motor exam as listed above. Psychiatric: Normal thought content, mood appropriate to clinical situation.     Assessment: Eduardo Vasquez is a 84 y.o. male who who has a history of a left ocular stroke in 2003, no subsequent strokes or TIA's. Fortunately he has never used tobacco and does not have DM.   DATA Carotid Duplex (05/26/17): 1-39% bilateral internal carotid artery stenosis. Both vertebral arteries are antegrade, both subclavian arteries are multiphasic. No significant change in comparison to the exams on 11/07/2013, 11/20/14, and 11-26-15.    Plan: Follow-up in 2 years with Carotid Duplex scan.    I discussed in depth with the patient the nature of atherosclerosis, and emphasized the importance of maximal medical management including strict control of blood pressure, blood glucose, and lipid levels, obtaining regular exercise, and continued cessation of smoking.  The patient is aware that without maximal medical management the underlying atherosclerotic disease process will progress, limiting the benefit of any interventions. The patient was given information about stroke prevention and what symptoms should prompt the patient to seek immediate medical care. Thank you for allowing [place] to participate in this patient's care.  Clemon Chambers, RN, MSN, FNP-C [facility] Office: [phone]  Clinic Physician: Chen/Cain  05/26/17 10:53 AM

## 2017-05-26 NOTE — Patient Instructions (Signed)

## 2017-06-05 DIAGNOSIS — I10 Essential (primary) hypertension: Secondary | ICD-10-CM | POA: Diagnosis not present

## 2017-06-05 DIAGNOSIS — E785 Hyperlipidemia, unspecified: Secondary | ICD-10-CM | POA: Diagnosis not present

## 2017-06-05 DIAGNOSIS — F0390 Unspecified dementia without behavioral disturbance: Secondary | ICD-10-CM | POA: Diagnosis not present

## 2017-06-05 DIAGNOSIS — N183 Chronic kidney disease, stage 3 (moderate): Secondary | ICD-10-CM | POA: Diagnosis not present

## 2017-12-14 DIAGNOSIS — Z0000 Encounter for general adult medical examination without abnormal findings: Secondary | ICD-10-CM | POA: Diagnosis not present

## 2017-12-14 DIAGNOSIS — Z23 Encounter for immunization: Secondary | ICD-10-CM | POA: Diagnosis not present

## 2017-12-15 DIAGNOSIS — C61 Malignant neoplasm of prostate: Secondary | ICD-10-CM | POA: Diagnosis not present

## 2017-12-15 DIAGNOSIS — I6350 Cerebral infarction due to unspecified occlusion or stenosis of unspecified cerebral artery: Secondary | ICD-10-CM | POA: Diagnosis not present

## 2017-12-15 DIAGNOSIS — N183 Chronic kidney disease, stage 3 (moderate): Secondary | ICD-10-CM | POA: Diagnosis not present

## 2017-12-15 DIAGNOSIS — R413 Other amnesia: Secondary | ICD-10-CM | POA: Diagnosis not present

## 2017-12-15 DIAGNOSIS — I10 Essential (primary) hypertension: Secondary | ICD-10-CM | POA: Diagnosis not present

## 2017-12-15 DIAGNOSIS — I129 Hypertensive chronic kidney disease with stage 1 through stage 4 chronic kidney disease, or unspecified chronic kidney disease: Secondary | ICD-10-CM | POA: Diagnosis not present

## 2017-12-15 DIAGNOSIS — Z0000 Encounter for general adult medical examination without abnormal findings: Secondary | ICD-10-CM | POA: Diagnosis not present

## 2017-12-15 DIAGNOSIS — E785 Hyperlipidemia, unspecified: Secondary | ICD-10-CM | POA: Diagnosis not present

## 2017-12-15 DIAGNOSIS — I499 Cardiac arrhythmia, unspecified: Secondary | ICD-10-CM | POA: Diagnosis not present

## 2017-12-15 DIAGNOSIS — F0390 Unspecified dementia without behavioral disturbance: Secondary | ICD-10-CM | POA: Diagnosis not present

## 2018-07-31 DIAGNOSIS — I129 Hypertensive chronic kidney disease with stage 1 through stage 4 chronic kidney disease, or unspecified chronic kidney disease: Secondary | ICD-10-CM | POA: Diagnosis not present

## 2018-07-31 DIAGNOSIS — I10 Essential (primary) hypertension: Secondary | ICD-10-CM | POA: Diagnosis not present

## 2018-07-31 DIAGNOSIS — E785 Hyperlipidemia, unspecified: Secondary | ICD-10-CM | POA: Diagnosis not present

## 2018-08-01 DIAGNOSIS — I129 Hypertensive chronic kidney disease with stage 1 through stage 4 chronic kidney disease, or unspecified chronic kidney disease: Secondary | ICD-10-CM | POA: Diagnosis not present

## 2018-08-01 DIAGNOSIS — E785 Hyperlipidemia, unspecified: Secondary | ICD-10-CM | POA: Diagnosis not present

## 2018-08-01 DIAGNOSIS — I10 Essential (primary) hypertension: Secondary | ICD-10-CM | POA: Diagnosis not present

## 2018-10-31 DIAGNOSIS — E785 Hyperlipidemia, unspecified: Secondary | ICD-10-CM | POA: Diagnosis not present

## 2018-10-31 DIAGNOSIS — G4733 Obstructive sleep apnea (adult) (pediatric): Secondary | ICD-10-CM | POA: Diagnosis not present

## 2018-10-31 DIAGNOSIS — N183 Chronic kidney disease, stage 3 (moderate): Secondary | ICD-10-CM | POA: Diagnosis not present

## 2018-11-01 DIAGNOSIS — G4733 Obstructive sleep apnea (adult) (pediatric): Secondary | ICD-10-CM | POA: Diagnosis not present

## 2018-11-01 DIAGNOSIS — N183 Chronic kidney disease, stage 3 (moderate): Secondary | ICD-10-CM | POA: Diagnosis not present

## 2018-11-01 DIAGNOSIS — E785 Hyperlipidemia, unspecified: Secondary | ICD-10-CM | POA: Diagnosis not present

## 2019-04-12 DIAGNOSIS — E785 Hyperlipidemia, unspecified: Secondary | ICD-10-CM | POA: Diagnosis not present

## 2019-04-12 DIAGNOSIS — E039 Hypothyroidism, unspecified: Secondary | ICD-10-CM | POA: Diagnosis not present

## 2019-04-12 DIAGNOSIS — I10 Essential (primary) hypertension: Secondary | ICD-10-CM | POA: Diagnosis not present

## 2019-04-15 DIAGNOSIS — R739 Hyperglycemia, unspecified: Secondary | ICD-10-CM | POA: Diagnosis not present

## 2019-04-15 DIAGNOSIS — E785 Hyperlipidemia, unspecified: Secondary | ICD-10-CM | POA: Diagnosis not present

## 2019-04-15 DIAGNOSIS — Z0001 Encounter for general adult medical examination with abnormal findings: Secondary | ICD-10-CM | POA: Diagnosis not present

## 2019-04-16 ENCOUNTER — Other Ambulatory Visit: Payer: Self-pay | Admitting: Internal Medicine

## 2019-04-16 ENCOUNTER — Other Ambulatory Visit (HOSPITAL_COMMUNITY): Payer: Self-pay | Admitting: Internal Medicine

## 2019-04-16 DIAGNOSIS — N183 Chronic kidney disease, stage 3 unspecified: Secondary | ICD-10-CM

## 2019-04-23 ENCOUNTER — Other Ambulatory Visit: Payer: Self-pay

## 2019-04-23 ENCOUNTER — Ambulatory Visit (HOSPITAL_COMMUNITY)
Admission: RE | Admit: 2019-04-23 | Discharge: 2019-04-23 | Disposition: A | Discharge status: Home / Self Care | Payer: Medicare Other | Source: Ambulatory Visit | Attending: Internal Medicine | Admitting: Internal Medicine

## 2019-04-23 DIAGNOSIS — N183 Chronic kidney disease, stage 3 unspecified: Secondary | ICD-10-CM | POA: Diagnosis not present

## 2019-05-05 ENCOUNTER — Ambulatory Visit: Payer: Medicare Other | Attending: Internal Medicine

## 2019-05-05 DIAGNOSIS — Z23 Encounter for immunization: Secondary | ICD-10-CM | POA: Insufficient documentation

## 2019-05-05 NOTE — Progress Notes (Signed)
   Covid-19 Vaccination Clinic  Name:  Eduardo Vasquez    MRN: [id number] DOB: 10-04-1929  05/05/2019  Eduardo Vasquez was observed post Covid-19 immunization for 15 minutes without incidence. He was provided with Vaccine Information Sheet and instruction to access the V-Safe system.   Eduardo Vasquez was instructed to call 911 with any severe reactions post vaccine: Marland Kitchen Difficulty breathing  . Swelling of your face and throat  . A fast heartbeat  . A bad rash all over your body  . Dizziness and weakness    Immunizations Administered    Name Date Dose VIS Date Route   Pfizer COVID-19 Vaccine 05/05/2019  2:34 PM 0.3 mL 02/22/2019 Intramuscular   Manufacturer: [place]   Lot: [id number]   [place]: [id number]

## 2019-05-28 DIAGNOSIS — I10 Essential (primary) hypertension: Secondary | ICD-10-CM | POA: Diagnosis not present

## 2019-05-28 DIAGNOSIS — N1831 Chronic kidney disease, stage 3a: Secondary | ICD-10-CM | POA: Diagnosis not present

## 2019-05-28 DIAGNOSIS — Z0001 Encounter for general adult medical examination with abnormal findings: Secondary | ICD-10-CM | POA: Diagnosis not present

## 2019-05-28 DIAGNOSIS — Z1389 Encounter for screening for other disorder: Secondary | ICD-10-CM | POA: Diagnosis not present

## 2019-05-29 ENCOUNTER — Ambulatory Visit: Payer: Medicare Other | Attending: Internal Medicine

## 2019-05-29 DIAGNOSIS — Z23 Encounter for immunization: Secondary | ICD-10-CM

## 2019-05-29 NOTE — Progress Notes (Signed)
   Covid-19 Vaccination Clinic  Name:  GASPER ISHIKAWA    MRN: [id number] DOB: 1929/06/19  05/29/2019  Mr. Wolfert was observed post Covid-19 immunization for 15 minutes without incident. He was provided with Vaccine Information Sheet and instruction to access the V-Safe system.   Mr. Plasencia was instructed to call 911 with any severe reactions post vaccine: Marland Kitchen Difficulty breathing  . Swelling of face and throat  . A fast heartbeat  . A bad rash all over body  . Dizziness and weakness   Immunizations Administered    Name Date Dose VIS Date Route   Pfizer COVID-19 Vaccine 05/29/2019  1:18 PM 0.3 mL 02/22/2019 Intramuscular   Manufacturer: [place]   Lot: [id number]   [place]: [id number]

## 2019-06-28 DIAGNOSIS — E039 Hypothyroidism, unspecified: Secondary | ICD-10-CM | POA: Diagnosis not present

## 2019-06-28 DIAGNOSIS — I10 Essential (primary) hypertension: Secondary | ICD-10-CM | POA: Diagnosis not present

## 2019-07-28 DIAGNOSIS — E039 Hypothyroidism, unspecified: Secondary | ICD-10-CM | POA: Diagnosis not present

## 2019-07-28 DIAGNOSIS — I10 Essential (primary) hypertension: Secondary | ICD-10-CM | POA: Diagnosis not present

## 2019-08-01 DIAGNOSIS — I129 Hypertensive chronic kidney disease with stage 1 through stage 4 chronic kidney disease, or unspecified chronic kidney disease: Secondary | ICD-10-CM | POA: Diagnosis not present

## 2019-08-01 DIAGNOSIS — N1832 Chronic kidney disease, stage 3b: Secondary | ICD-10-CM | POA: Diagnosis not present

## 2019-08-01 DIAGNOSIS — D638 Anemia in other chronic diseases classified elsewhere: Secondary | ICD-10-CM | POA: Diagnosis not present

## 2019-08-01 DIAGNOSIS — Z79899 Other long term (current) drug therapy: Secondary | ICD-10-CM | POA: Diagnosis not present

## 2019-08-01 DIAGNOSIS — N281 Cyst of kidney, acquired: Secondary | ICD-10-CM | POA: Diagnosis not present

## 2019-08-19 DIAGNOSIS — N281 Cyst of kidney, acquired: Secondary | ICD-10-CM | POA: Diagnosis not present

## 2019-08-19 DIAGNOSIS — R809 Proteinuria, unspecified: Secondary | ICD-10-CM | POA: Diagnosis not present

## 2019-08-19 DIAGNOSIS — Z79899 Other long term (current) drug therapy: Secondary | ICD-10-CM | POA: Diagnosis not present

## 2019-08-19 DIAGNOSIS — D638 Anemia in other chronic diseases classified elsewhere: Secondary | ICD-10-CM | POA: Diagnosis not present

## 2019-08-19 DIAGNOSIS — N1832 Chronic kidney disease, stage 3b: Secondary | ICD-10-CM | POA: Diagnosis not present

## 2019-08-19 DIAGNOSIS — I129 Hypertensive chronic kidney disease with stage 1 through stage 4 chronic kidney disease, or unspecified chronic kidney disease: Secondary | ICD-10-CM | POA: Diagnosis not present

## 2019-08-28 DIAGNOSIS — E039 Hypothyroidism, unspecified: Secondary | ICD-10-CM | POA: Diagnosis not present

## 2019-08-28 DIAGNOSIS — I10 Essential (primary) hypertension: Secondary | ICD-10-CM | POA: Diagnosis not present

## 2019-08-29 DIAGNOSIS — N1832 Chronic kidney disease, stage 3b: Secondary | ICD-10-CM | POA: Diagnosis not present

## 2019-08-29 DIAGNOSIS — D638 Anemia in other chronic diseases classified elsewhere: Secondary | ICD-10-CM | POA: Diagnosis not present

## 2019-08-29 DIAGNOSIS — E211 Secondary hyperparathyroidism, not elsewhere classified: Secondary | ICD-10-CM | POA: Diagnosis not present

## 2019-08-29 DIAGNOSIS — I129 Hypertensive chronic kidney disease with stage 1 through stage 4 chronic kidney disease, or unspecified chronic kidney disease: Secondary | ICD-10-CM | POA: Diagnosis not present

## 2019-08-29 DIAGNOSIS — R809 Proteinuria, unspecified: Secondary | ICD-10-CM | POA: Diagnosis not present

## 2019-09-27 DIAGNOSIS — E785 Hyperlipidemia, unspecified: Secondary | ICD-10-CM | POA: Diagnosis not present

## 2019-09-27 DIAGNOSIS — E039 Hypothyroidism, unspecified: Secondary | ICD-10-CM | POA: Diagnosis not present

## 2019-10-28 DIAGNOSIS — E039 Hypothyroidism, unspecified: Secondary | ICD-10-CM | POA: Diagnosis not present

## 2019-10-28 DIAGNOSIS — I10 Essential (primary) hypertension: Secondary | ICD-10-CM | POA: Diagnosis not present

## 2019-11-27 DIAGNOSIS — E039 Hypothyroidism, unspecified: Secondary | ICD-10-CM | POA: Diagnosis not present

## 2019-11-27 DIAGNOSIS — N1832 Chronic kidney disease, stage 3b: Secondary | ICD-10-CM | POA: Diagnosis not present

## 2019-11-27 DIAGNOSIS — I10 Essential (primary) hypertension: Secondary | ICD-10-CM | POA: Diagnosis not present

## 2019-12-27 DIAGNOSIS — N1832 Chronic kidney disease, stage 3b: Secondary | ICD-10-CM | POA: Diagnosis not present

## 2019-12-27 DIAGNOSIS — E211 Secondary hyperparathyroidism, not elsewhere classified: Secondary | ICD-10-CM | POA: Diagnosis not present

## 2019-12-27 DIAGNOSIS — R809 Proteinuria, unspecified: Secondary | ICD-10-CM | POA: Diagnosis not present

## 2019-12-27 DIAGNOSIS — R7303 Prediabetes: Secondary | ICD-10-CM | POA: Diagnosis not present

## 2019-12-27 DIAGNOSIS — I10 Essential (primary) hypertension: Secondary | ICD-10-CM | POA: Diagnosis not present

## 2019-12-27 DIAGNOSIS — I129 Hypertensive chronic kidney disease with stage 1 through stage 4 chronic kidney disease, or unspecified chronic kidney disease: Secondary | ICD-10-CM | POA: Diagnosis not present

## 2020-01-03 DIAGNOSIS — N1832 Chronic kidney disease, stage 3b: Secondary | ICD-10-CM | POA: Diagnosis not present

## 2020-01-03 DIAGNOSIS — E211 Secondary hyperparathyroidism, not elsewhere classified: Secondary | ICD-10-CM | POA: Diagnosis not present

## 2020-01-03 DIAGNOSIS — D638 Anemia in other chronic diseases classified elsewhere: Secondary | ICD-10-CM | POA: Diagnosis not present

## 2020-01-03 DIAGNOSIS — D696 Thrombocytopenia, unspecified: Secondary | ICD-10-CM | POA: Diagnosis not present

## 2020-01-03 DIAGNOSIS — I129 Hypertensive chronic kidney disease with stage 1 through stage 4 chronic kidney disease, or unspecified chronic kidney disease: Secondary | ICD-10-CM | POA: Diagnosis not present

## 2020-01-10 DIAGNOSIS — E211 Secondary hyperparathyroidism, not elsewhere classified: Secondary | ICD-10-CM | POA: Diagnosis not present

## 2020-01-10 DIAGNOSIS — I129 Hypertensive chronic kidney disease with stage 1 through stage 4 chronic kidney disease, or unspecified chronic kidney disease: Secondary | ICD-10-CM | POA: Diagnosis not present

## 2020-01-10 DIAGNOSIS — D696 Thrombocytopenia, unspecified: Secondary | ICD-10-CM | POA: Diagnosis not present

## 2020-01-10 DIAGNOSIS — N1832 Chronic kidney disease, stage 3b: Secondary | ICD-10-CM | POA: Diagnosis not present

## 2020-01-10 DIAGNOSIS — D638 Anemia in other chronic diseases classified elsewhere: Secondary | ICD-10-CM | POA: Diagnosis not present

## 2020-01-25 ENCOUNTER — Ambulatory Visit: Payer: Medicare Other | Attending: Internal Medicine

## 2020-01-25 DIAGNOSIS — Z23 Encounter for immunization: Secondary | ICD-10-CM

## 2020-01-25 NOTE — Progress Notes (Signed)
   Covid-19 Vaccination Clinic  Name:  Eduardo Vasquez    MRN: [id number] DOB: [birth year]  01/25/2020  [name] was observed post Covid-19 immunization for 15 minutes without incident. He was provided with Vaccine Information Sheet and instruction to access the V-Safe system.   [name] was instructed to call 911 with any severe reactions post vaccine: Marland Kitchen Difficulty breathing  . Swelling of face and throat  . A fast heartbeat  . A bad rash all over body  . Dizziness and weakness   Immunizations Administered    Name Date Dose VIS Date Route   Pfizer COVID-19 Vaccine 01/25/2020 11:28 AM 0.3 mL 01/01/2020 Intramuscular   Manufacturer: [place]   Lot: [id number]   [place]: [id number]

## 2020-03-03 DIAGNOSIS — I10 Essential (primary) hypertension: Secondary | ICD-10-CM | POA: Diagnosis not present

## 2020-03-03 DIAGNOSIS — N1832 Chronic kidney disease, stage 3b: Secondary | ICD-10-CM | POA: Diagnosis not present

## 2020-03-12 DIAGNOSIS — I129 Hypertensive chronic kidney disease with stage 1 through stage 4 chronic kidney disease, or unspecified chronic kidney disease: Secondary | ICD-10-CM | POA: Diagnosis not present

## 2020-03-12 DIAGNOSIS — E211 Secondary hyperparathyroidism, not elsewhere classified: Secondary | ICD-10-CM | POA: Diagnosis not present

## 2020-03-12 DIAGNOSIS — N1832 Chronic kidney disease, stage 3b: Secondary | ICD-10-CM | POA: Diagnosis not present

## 2020-03-12 DIAGNOSIS — D696 Thrombocytopenia, unspecified: Secondary | ICD-10-CM | POA: Diagnosis not present

## 2020-03-12 DIAGNOSIS — D638 Anemia in other chronic diseases classified elsewhere: Secondary | ICD-10-CM | POA: Diagnosis not present

## 2020-03-17 DIAGNOSIS — N1832 Chronic kidney disease, stage 3b: Secondary | ICD-10-CM | POA: Diagnosis not present

## 2020-03-17 DIAGNOSIS — I129 Hypertensive chronic kidney disease with stage 1 through stage 4 chronic kidney disease, or unspecified chronic kidney disease: Secondary | ICD-10-CM | POA: Diagnosis not present

## 2020-03-17 DIAGNOSIS — Z79899 Other long term (current) drug therapy: Secondary | ICD-10-CM | POA: Diagnosis not present

## 2020-03-17 DIAGNOSIS — R809 Proteinuria, unspecified: Secondary | ICD-10-CM | POA: Diagnosis not present

## 2020-03-17 DIAGNOSIS — D638 Anemia in other chronic diseases classified elsewhere: Secondary | ICD-10-CM | POA: Diagnosis not present

## 2020-03-17 DIAGNOSIS — E211 Secondary hyperparathyroidism, not elsewhere classified: Secondary | ICD-10-CM | POA: Diagnosis not present

## 2020-04-03 DIAGNOSIS — E039 Hypothyroidism, unspecified: Secondary | ICD-10-CM | POA: Diagnosis not present

## 2020-04-03 DIAGNOSIS — N1832 Chronic kidney disease, stage 3b: Secondary | ICD-10-CM | POA: Diagnosis not present

## 2020-05-04 DIAGNOSIS — N1832 Chronic kidney disease, stage 3b: Secondary | ICD-10-CM | POA: Diagnosis not present

## 2020-05-04 DIAGNOSIS — I10 Essential (primary) hypertension: Secondary | ICD-10-CM | POA: Diagnosis not present

## 2020-05-14 DIAGNOSIS — N1832 Chronic kidney disease, stage 3b: Secondary | ICD-10-CM | POA: Diagnosis not present

## 2020-05-14 DIAGNOSIS — D638 Anemia in other chronic diseases classified elsewhere: Secondary | ICD-10-CM | POA: Diagnosis not present

## 2020-05-14 DIAGNOSIS — I129 Hypertensive chronic kidney disease with stage 1 through stage 4 chronic kidney disease, or unspecified chronic kidney disease: Secondary | ICD-10-CM | POA: Diagnosis not present

## 2020-05-14 DIAGNOSIS — R809 Proteinuria, unspecified: Secondary | ICD-10-CM | POA: Diagnosis not present

## 2020-05-14 DIAGNOSIS — E211 Secondary hyperparathyroidism, not elsewhere classified: Secondary | ICD-10-CM | POA: Diagnosis not present

## 2020-05-20 DIAGNOSIS — D638 Anemia in other chronic diseases classified elsewhere: Secondary | ICD-10-CM | POA: Diagnosis not present

## 2020-05-20 DIAGNOSIS — R809 Proteinuria, unspecified: Secondary | ICD-10-CM | POA: Diagnosis not present

## 2020-05-20 DIAGNOSIS — N1832 Chronic kidney disease, stage 3b: Secondary | ICD-10-CM | POA: Diagnosis not present

## 2020-05-20 DIAGNOSIS — E211 Secondary hyperparathyroidism, not elsewhere classified: Secondary | ICD-10-CM | POA: Diagnosis not present

## 2020-05-20 DIAGNOSIS — I129 Hypertensive chronic kidney disease with stage 1 through stage 4 chronic kidney disease, or unspecified chronic kidney disease: Secondary | ICD-10-CM | POA: Diagnosis not present

## 2020-06-08 DIAGNOSIS — N1832 Chronic kidney disease, stage 3b: Secondary | ICD-10-CM | POA: Diagnosis not present

## 2020-06-08 DIAGNOSIS — Z1389 Encounter for screening for other disorder: Secondary | ICD-10-CM | POA: Diagnosis not present

## 2020-06-08 DIAGNOSIS — I10 Essential (primary) hypertension: Secondary | ICD-10-CM | POA: Diagnosis not present

## 2020-06-08 DIAGNOSIS — E039 Hypothyroidism, unspecified: Secondary | ICD-10-CM | POA: Diagnosis not present

## 2020-06-08 DIAGNOSIS — Z0001 Encounter for general adult medical examination with abnormal findings: Secondary | ICD-10-CM | POA: Diagnosis not present

## 2020-06-09 ENCOUNTER — Other Ambulatory Visit (HOSPITAL_COMMUNITY): Payer: Self-pay | Admitting: Gerontology

## 2020-06-09 ENCOUNTER — Other Ambulatory Visit: Payer: Self-pay

## 2020-06-09 ENCOUNTER — Other Ambulatory Visit (HOSPITAL_COMMUNITY)
Admission: RE | Admit: 2020-06-09 | Discharge: 2020-06-09 | Disposition: A | Discharge status: Home / Self Care | Payer: Medicare Other | Source: Ambulatory Visit | Attending: Internal Medicine | Admitting: Internal Medicine

## 2020-06-09 ENCOUNTER — Ambulatory Visit (HOSPITAL_COMMUNITY)
Admission: RE | Admit: 2020-06-09 | Discharge: 2020-06-09 | Disposition: A | Discharge status: Home / Self Care | Payer: Medicare Other | Source: Ambulatory Visit | Attending: Gerontology | Admitting: Gerontology

## 2020-06-09 DIAGNOSIS — N1832 Chronic kidney disease, stage 3b: Secondary | ICD-10-CM | POA: Insufficient documentation

## 2020-06-09 DIAGNOSIS — I10 Essential (primary) hypertension: Secondary | ICD-10-CM | POA: Insufficient documentation

## 2020-06-09 DIAGNOSIS — Z79899 Other long term (current) drug therapy: Secondary | ICD-10-CM | POA: Insufficient documentation

## 2020-06-09 DIAGNOSIS — M19012 Primary osteoarthritis, left shoulder: Secondary | ICD-10-CM | POA: Diagnosis not present

## 2020-06-09 DIAGNOSIS — Z0001 Encounter for general adult medical examination with abnormal findings: Secondary | ICD-10-CM | POA: Insufficient documentation

## 2020-06-09 DIAGNOSIS — M25512 Pain in left shoulder: Secondary | ICD-10-CM | POA: Diagnosis not present

## 2020-06-09 DIAGNOSIS — E039 Hypothyroidism, unspecified: Secondary | ICD-10-CM | POA: Diagnosis not present

## 2020-06-09 LAB — BASIC METABOLIC PANEL
Anion gap: 8 (ref 5–15)
BUN: 17 mg/dL (ref 8–23)
CO2: 24 mmol/L (ref 22–32)
Calcium: 8.5 mg/dL — ABNORMAL LOW (ref 8.9–10.3)
Chloride: 107 mmol/L (ref 98–111)
Creatinine, Ser: 1.6 mg/dL — ABNORMAL HIGH (ref 0.61–1.24)
GFR, Estimated: 41 mL/min — ABNORMAL LOW (ref >60)
Glucose, Bld: 178 mg/dL — ABNORMAL HIGH (ref 70–99)
Potassium: 3.3 mmol/L — ABNORMAL LOW (ref 3.5–5.1)
Sodium: 139 mmol/L (ref 135–145)

## 2020-06-09 LAB — HEPATIC FUNCTION PANEL
ALT: 18 U/L (ref 0–44)
AST: 25 U/L (ref 15–41)
Albumin: 3.3 g/dL — ABNORMAL LOW (ref 3.5–5.0)
Alkaline Phosphatase: 59 U/L (ref 38–126)
Bilirubin, Direct: 0.1 mg/dL (ref 0.0–0.2)
Indirect Bilirubin: 0.6 mg/dL (ref 0.3–0.9)
Total Bilirubin: 0.7 mg/dL (ref 0.3–1.2)
Total Protein: 7.1 g/dL (ref 6.5–8.1)

## 2020-06-09 LAB — LIPID PANEL
Cholesterol: 160 mg/dL (ref 0–200)
HDL: 61 mg/dL (ref >40)
LDL Cholesterol: 86 mg/dL (ref 0–99)
Total CHOL/HDL Ratio: 2.6 RATIO
Triglycerides: 67 mg/dL (ref <150)
VLDL: 13 mg/dL (ref 0–40)

## 2020-06-09 LAB — CBC WITH DIFFERENTIAL/PLATELET
Abs Immature Granulocytes: 0.02 10*3/uL (ref 0.00–0.07)
Basophils Absolute: 0.1 10*3/uL (ref 0.0–0.1)
Basophils Relative: 1 %
Eosinophils Absolute: 0.1 10*3/uL (ref 0.0–0.5)
Eosinophils Relative: 2 %
HCT: 38.7 % — ABNORMAL LOW (ref 39.0–52.0)
Hemoglobin: 12.2 g/dL — ABNORMAL LOW (ref 13.0–17.0)
Immature Granulocytes: 0 %
Lymphocytes Relative: 32 %
Lymphs Abs: 1.9 10*3/uL (ref 0.7–4.0)
MCH: 27.1 pg (ref 26.0–34.0)
MCHC: 31.5 g/dL (ref 30.0–36.0)
MCV: 86 fL (ref 80.0–100.0)
Monocytes Absolute: 0.6 10*3/uL (ref 0.1–1.0)
Monocytes Relative: 9 %
Neutro Abs: 3.4 10*3/uL (ref 1.7–7.7)
Neutrophils Relative %: 56 %
Platelets: 190 10*3/uL (ref 150–400)
RBC: 4.5 MIL/uL (ref 4.22–5.81)
RDW: 16.9 % — ABNORMAL HIGH (ref 11.5–15.5)
WBC: 6.1 10*3/uL (ref 4.0–10.5)
nRBC: 0 % (ref 0.0–0.2)

## 2020-06-09 LAB — TSH: TSH: 4.344 u[IU]/mL (ref 0.350–4.500)

## 2020-06-10 LAB — T4: T4, Total: 8.4 ug/dL (ref 4.5–12.0)

## 2020-07-09 DIAGNOSIS — N1832 Chronic kidney disease, stage 3b: Secondary | ICD-10-CM | POA: Diagnosis not present

## 2020-07-09 DIAGNOSIS — I10 Essential (primary) hypertension: Secondary | ICD-10-CM | POA: Diagnosis not present

## 2020-07-28 DIAGNOSIS — N1832 Chronic kidney disease, stage 3b: Secondary | ICD-10-CM | POA: Diagnosis not present

## 2020-07-28 DIAGNOSIS — E211 Secondary hyperparathyroidism, not elsewhere classified: Secondary | ICD-10-CM | POA: Diagnosis not present

## 2020-07-28 DIAGNOSIS — R809 Proteinuria, unspecified: Secondary | ICD-10-CM | POA: Diagnosis not present

## 2020-07-28 DIAGNOSIS — D638 Anemia in other chronic diseases classified elsewhere: Secondary | ICD-10-CM | POA: Diagnosis not present

## 2020-07-28 DIAGNOSIS — I129 Hypertensive chronic kidney disease with stage 1 through stage 4 chronic kidney disease, or unspecified chronic kidney disease: Secondary | ICD-10-CM | POA: Diagnosis not present

## 2020-07-29 DIAGNOSIS — D638 Anemia in other chronic diseases classified elsewhere: Secondary | ICD-10-CM | POA: Diagnosis not present

## 2020-07-29 DIAGNOSIS — I129 Hypertensive chronic kidney disease with stage 1 through stage 4 chronic kidney disease, or unspecified chronic kidney disease: Secondary | ICD-10-CM | POA: Diagnosis not present

## 2020-07-29 DIAGNOSIS — N1832 Chronic kidney disease, stage 3b: Secondary | ICD-10-CM | POA: Diagnosis not present

## 2020-07-29 DIAGNOSIS — R809 Proteinuria, unspecified: Secondary | ICD-10-CM | POA: Diagnosis not present

## 2020-07-29 DIAGNOSIS — E211 Secondary hyperparathyroidism, not elsewhere classified: Secondary | ICD-10-CM | POA: Diagnosis not present

## 2020-08-08 DIAGNOSIS — N1832 Chronic kidney disease, stage 3b: Secondary | ICD-10-CM | POA: Diagnosis not present

## 2020-08-08 DIAGNOSIS — I10 Essential (primary) hypertension: Secondary | ICD-10-CM | POA: Diagnosis not present

## 2020-09-08 DIAGNOSIS — N1832 Chronic kidney disease, stage 3b: Secondary | ICD-10-CM | POA: Diagnosis not present

## 2020-09-08 DIAGNOSIS — I10 Essential (primary) hypertension: Secondary | ICD-10-CM | POA: Diagnosis not present

## 2020-10-08 DIAGNOSIS — N1832 Chronic kidney disease, stage 3b: Secondary | ICD-10-CM | POA: Diagnosis not present

## 2020-10-08 DIAGNOSIS — I10 Essential (primary) hypertension: Secondary | ICD-10-CM | POA: Diagnosis not present

## 2020-10-22 DIAGNOSIS — N1832 Chronic kidney disease, stage 3b: Secondary | ICD-10-CM | POA: Diagnosis not present

## 2020-10-22 DIAGNOSIS — I129 Hypertensive chronic kidney disease with stage 1 through stage 4 chronic kidney disease, or unspecified chronic kidney disease: Secondary | ICD-10-CM | POA: Diagnosis not present

## 2020-10-22 DIAGNOSIS — E211 Secondary hyperparathyroidism, not elsewhere classified: Secondary | ICD-10-CM | POA: Diagnosis not present

## 2020-10-22 DIAGNOSIS — D638 Anemia in other chronic diseases classified elsewhere: Secondary | ICD-10-CM | POA: Diagnosis not present

## 2020-10-22 DIAGNOSIS — R809 Proteinuria, unspecified: Secondary | ICD-10-CM | POA: Diagnosis not present

## 2020-10-29 DIAGNOSIS — R809 Proteinuria, unspecified: Secondary | ICD-10-CM | POA: Diagnosis not present

## 2020-10-29 DIAGNOSIS — I129 Hypertensive chronic kidney disease with stage 1 through stage 4 chronic kidney disease, or unspecified chronic kidney disease: Secondary | ICD-10-CM | POA: Diagnosis not present

## 2020-10-29 DIAGNOSIS — R768 Other specified abnormal immunological findings in serum: Secondary | ICD-10-CM | POA: Diagnosis not present

## 2020-10-29 DIAGNOSIS — N1832 Chronic kidney disease, stage 3b: Secondary | ICD-10-CM | POA: Diagnosis not present

## 2020-10-29 DIAGNOSIS — D638 Anemia in other chronic diseases classified elsewhere: Secondary | ICD-10-CM | POA: Diagnosis not present

## 2020-10-29 DIAGNOSIS — E211 Secondary hyperparathyroidism, not elsewhere classified: Secondary | ICD-10-CM | POA: Diagnosis not present

## 2020-11-07 ENCOUNTER — Inpatient Hospital Stay (HOSPITAL_COMMUNITY): Payer: Medicare Other

## 2020-11-07 ENCOUNTER — Other Ambulatory Visit: Payer: Self-pay

## 2020-11-07 ENCOUNTER — Inpatient Hospital Stay (HOSPITAL_COMMUNITY)
Admission: EM | Admit: 2020-11-07 | Discharge: 2020-11-09 | DRG: 062 | Disposition: A | Discharge status: Home Health | Payer: Medicare Other | Attending: Neurology | Admitting: Neurology

## 2020-11-07 ENCOUNTER — Emergency Department (HOSPITAL_COMMUNITY): Payer: Medicare Other

## 2020-11-07 DIAGNOSIS — Z743 Need for continuous supervision: Secondary | ICD-10-CM | POA: Diagnosis not present

## 2020-11-07 DIAGNOSIS — R404 Transient alteration of awareness: Secondary | ICD-10-CM | POA: Diagnosis not present

## 2020-11-07 DIAGNOSIS — E7800 Pure hypercholesterolemia, unspecified: Secondary | ICD-10-CM | POA: Diagnosis not present

## 2020-11-07 DIAGNOSIS — R29706 NIHSS score 6: Secondary | ICD-10-CM | POA: Diagnosis not present

## 2020-11-07 DIAGNOSIS — R531 Weakness: Secondary | ICD-10-CM

## 2020-11-07 DIAGNOSIS — I69351 Hemiplegia and hemiparesis following cerebral infarction affecting right dominant side: Secondary | ICD-10-CM

## 2020-11-07 DIAGNOSIS — I63511 Cerebral infarction due to unspecified occlusion or stenosis of right middle cerebral artery: Secondary | ICD-10-CM | POA: Diagnosis not present

## 2020-11-07 DIAGNOSIS — R29810 Facial weakness: Secondary | ICD-10-CM | POA: Diagnosis present

## 2020-11-07 DIAGNOSIS — F0390 Unspecified dementia without behavioral disturbance: Secondary | ICD-10-CM | POA: Diagnosis present

## 2020-11-07 DIAGNOSIS — Z20822 Contact with and (suspected) exposure to covid-19: Secondary | ICD-10-CM | POA: Diagnosis not present

## 2020-11-07 DIAGNOSIS — I639 Cerebral infarction, unspecified: Secondary | ICD-10-CM | POA: Diagnosis not present

## 2020-11-07 DIAGNOSIS — H5462 Unqualified visual loss, left eye, normal vision right eye: Secondary | ICD-10-CM | POA: Diagnosis not present

## 2020-11-07 DIAGNOSIS — Z8546 Personal history of malignant neoplasm of prostate: Secondary | ICD-10-CM

## 2020-11-07 DIAGNOSIS — I6782 Cerebral ischemia: Secondary | ICD-10-CM | POA: Diagnosis not present

## 2020-11-07 DIAGNOSIS — Z79890 Hormone replacement therapy (postmenopausal): Secondary | ICD-10-CM | POA: Diagnosis not present

## 2020-11-07 DIAGNOSIS — Z8042 Family history of malignant neoplasm of prostate: Secondary | ICD-10-CM | POA: Diagnosis not present

## 2020-11-07 DIAGNOSIS — Z7982 Long term (current) use of aspirin: Secondary | ICD-10-CM

## 2020-11-07 DIAGNOSIS — I69398 Other sequelae of cerebral infarction: Secondary | ICD-10-CM

## 2020-11-07 DIAGNOSIS — Z79899 Other long term (current) drug therapy: Secondary | ICD-10-CM

## 2020-11-07 DIAGNOSIS — N179 Acute kidney failure, unspecified: Secondary | ICD-10-CM | POA: Diagnosis present

## 2020-11-07 DIAGNOSIS — Z8249 Family history of ischemic heart disease and other diseases of the circulatory system: Secondary | ICD-10-CM | POA: Diagnosis not present

## 2020-11-07 DIAGNOSIS — E119 Type 2 diabetes mellitus without complications: Secondary | ICD-10-CM | POA: Diagnosis present

## 2020-11-07 DIAGNOSIS — R4781 Slurred speech: Secondary | ICD-10-CM | POA: Diagnosis present

## 2020-11-07 DIAGNOSIS — G319 Degenerative disease of nervous system, unspecified: Secondary | ICD-10-CM | POA: Diagnosis not present

## 2020-11-07 DIAGNOSIS — I10 Essential (primary) hypertension: Secondary | ICD-10-CM | POA: Diagnosis present

## 2020-11-07 LAB — CBC
HCT: 41.6 % (ref 39.0–52.0)
HCT: 38.3 % — ABNORMAL LOW (ref 39.0–52.0)
Hemoglobin: 13.2 g/dL (ref 13.0–17.0)
Hemoglobin: 12.6 g/dL — ABNORMAL LOW (ref 13.0–17.0)
MCH: 27.1 pg (ref 26.0–34.0)
MCH: 27.3 pg (ref 26.0–34.0)
MCHC: 31.7 g/dL (ref 30.0–36.0)
MCHC: 32.9 g/dL (ref 30.0–36.0)
MCV: 85.4 fL (ref 80.0–100.0)
MCV: 82.9 fL (ref 80.0–100.0)
Platelets: 169 10*3/uL (ref 150–400)
Platelets: 181 10*3/uL (ref 150–400)
RBC: 4.87 MIL/uL (ref 4.22–5.81)
RBC: 4.62 MIL/uL (ref 4.22–5.81)
RDW: 16.5 % — ABNORMAL HIGH (ref 11.5–15.5)
RDW: 16.4 % — ABNORMAL HIGH (ref 11.5–15.5)
WBC: 7.7 10*3/uL (ref 4.0–10.5)
WBC: 8.6 10*3/uL (ref 4.0–10.5)
nRBC: 0 % (ref 0.0–0.2)
nRBC: 0 % (ref 0.0–0.2)

## 2020-11-07 LAB — DIFFERENTIAL
Abs Immature Granulocytes: 0.09 10*3/uL — ABNORMAL HIGH (ref 0.00–0.07)
Basophils Absolute: 0.1 10*3/uL (ref 0.0–0.1)
Basophils Relative: 1 %
Eosinophils Absolute: 0.2 10*3/uL (ref 0.0–0.5)
Eosinophils Relative: 3 %
Immature Granulocytes: 1 %
Lymphocytes Relative: 21 %
Lymphs Abs: 1.6 10*3/uL (ref 0.7–4.0)
Monocytes Absolute: 0.7 10*3/uL (ref 0.1–1.0)
Monocytes Relative: 9 %
Neutro Abs: 5 10*3/uL (ref 1.7–7.7)
Neutrophils Relative %: 65 %

## 2020-11-07 LAB — RESP PANEL BY RT-PCR (FLU A&B, COVID) ARPGX2
Influenza A by PCR: NEGATIVE
Influenza B by PCR: NEGATIVE
SARS Coronavirus 2 by RT PCR: NEGATIVE

## 2020-11-07 LAB — APTT
aPTT: 25 seconds (ref 24–36)
aPTT: 27 seconds (ref 24–36)

## 2020-11-07 LAB — PROTIME-INR
INR: 1 (ref 0.8–1.2)
INR: 1 (ref 0.8–1.2)
Prothrombin Time: 13.6 seconds (ref 11.4–15.2)
Prothrombin Time: 13 seconds (ref 11.4–15.2)

## 2020-11-07 LAB — COMPREHENSIVE METABOLIC PANEL
ALT: 16 U/L (ref 0–44)
AST: 22 U/L (ref 15–41)
Albumin: 3.5 g/dL (ref 3.5–5.0)
Alkaline Phosphatase: 71 U/L (ref 38–126)
Anion gap: 7 (ref 5–15)
BUN: 20 mg/dL (ref 8–23)
CO2: 23 mmol/L (ref 22–32)
Calcium: 8.2 mg/dL — ABNORMAL LOW (ref 8.9–10.3)
Chloride: 106 mmol/L (ref 98–111)
Creatinine, Ser: 2.02 mg/dL — ABNORMAL HIGH (ref 0.61–1.24)
GFR, Estimated: 31 mL/min — ABNORMAL LOW (ref >60)
Glucose, Bld: 169 mg/dL — ABNORMAL HIGH (ref 70–99)
Potassium: 3.8 mmol/L (ref 3.5–5.1)
Sodium: 136 mmol/L (ref 135–145)
Total Bilirubin: 0.5 mg/dL (ref 0.3–1.2)
Total Protein: 7.7 g/dL (ref 6.5–8.1)

## 2020-11-07 LAB — CBG MONITORING, ED
Glucose-Capillary: 166 mg/dL — ABNORMAL HIGH (ref 70–99)
Glucose-Capillary: 119 mg/dL — ABNORMAL HIGH (ref 70–99)

## 2020-11-07 LAB — GLUCOSE, CAPILLARY
Glucose-Capillary: 138 mg/dL — ABNORMAL HIGH (ref 70–99)
Glucose-Capillary: 138 mg/dL — ABNORMAL HIGH (ref 70–99)

## 2020-11-07 LAB — ETHANOL: Alcohol, Ethyl (B): <10 mg/dL (ref <10)

## 2020-11-07 MED ORDER — CLEVIDIPINE BUTYRATE 0.5 MG/ML IV EMUL
0.0000 mg/h | INTRAVENOUS | Status: DC
  Filled 2020-11-07: qty 50

## 2020-11-07 MED ORDER — PRAVASTATIN SODIUM 40 MG PO TABS: 80.0000 mg | ORAL_TABLET | Freq: Every day | ORAL | Status: DC

## 2020-11-07 MED ORDER — PANTOPRAZOLE SODIUM 40 MG IV SOLR
40.0000 mg | Freq: Every day | INTRAVENOUS | Status: DC
  Administered 2020-11-07 – 2020-11-08 (×2): 40 mg via INTRAVENOUS
  Filled 2020-11-07 (×2): qty 40

## 2020-11-07 MED ORDER — ALTEPLASE 100 MG IV SOLR
INTRAVENOUS | Status: AC
  Administered 2020-11-07: 69 mg via INTRAVENOUS
  Filled 2020-11-07: qty 100

## 2020-11-07 MED ORDER — DONEPEZIL HCL 10 MG PO TABS
10.0000 mg | ORAL_TABLET | Freq: Every day | ORAL | Status: DC
  Administered 2020-11-08: 10 mg via ORAL
  Filled 2020-11-07 (×3): qty 1

## 2020-11-07 MED ORDER — SODIUM CHLORIDE 0.9 % IV SOLN
50.0000 mL | Freq: Once | INTRAVENOUS | Status: AC
  Administered 2020-11-07: 50 mL via INTRAVENOUS

## 2020-11-07 MED ORDER — STROKE: EARLY STAGES OF RECOVERY BOOK
Freq: Once | Status: AC
  Filled 2020-11-07: qty 1

## 2020-11-07 MED ORDER — ACETAMINOPHEN 325 MG PO TABS: 650.0000 mg | ORAL_TABLET | ORAL | Status: DC | PRN

## 2020-11-07 MED ORDER — ACETAMINOPHEN 650 MG RE SUPP: 650.0000 mg | RECTAL | Status: DC | PRN

## 2020-11-07 MED ORDER — ACETAMINOPHEN 160 MG/5ML PO SOLN: 650.0000 mg | ORAL | Status: DC | PRN

## 2020-11-07 MED ORDER — SENNOSIDES-DOCUSATE SODIUM 8.6-50 MG PO TABS: 1.0000 | ORAL_TABLET | Freq: Every evening | ORAL | Status: DC | PRN

## 2020-11-07 MED ORDER — CHLORHEXIDINE GLUCONATE 0.12 % MT SOLN
15.0000 mL | Freq: Two times a day (BID) | OROMUCOSAL | Status: DC
  Administered 2020-11-07 – 2020-11-09 (×3): 15 mL via OROMUCOSAL
  Filled 2020-11-07 (×2): qty 15

## 2020-11-07 MED ORDER — SODIUM CHLORIDE 0.9 % IV SOLN: INTRAVENOUS | Status: AC

## 2020-11-07 MED ORDER — CLEVIDIPINE BUTYRATE 0.5 MG/ML IV EMUL: 0.0000 mg/h | INTRAVENOUS | Status: DC

## 2020-11-07 MED ORDER — AMLODIPINE BESYLATE 10 MG PO TABS
10.0000 mg | ORAL_TABLET | Freq: Every day | ORAL | Status: DC
  Administered 2020-11-09: 10 mg via ORAL
  Filled 2020-11-07: qty 1

## 2020-11-07 MED ORDER — LEVOTHYROXINE SODIUM 75 MCG PO TABS
75.0000 ug | ORAL_TABLET | Freq: Every day | ORAL | Status: DC
  Administered 2020-11-09: 75 ug via ORAL
  Filled 2020-11-07: qty 1

## 2020-11-07 MED ORDER — DOXAZOSIN MESYLATE 4 MG PO TABS
4.0000 mg | ORAL_TABLET | Freq: Every day | ORAL | Status: DC
  Administered 2020-11-09: 4 mg via ORAL
  Filled 2020-11-07 (×2): qty 1

## 2020-11-07 MED ORDER — ALTEPLASE (STROKE) FULL DOSE INFUSION
0.9000 mg/kg | Freq: Once | INTRAVENOUS | Status: AC
  Administered 2020-11-07: 69 mg via INTRAVENOUS
  Filled 2020-11-07: qty 100

## 2020-11-07 MED ORDER — LABETALOL HCL 5 MG/ML IV SOLN
20.0000 mg | Freq: Once | INTRAVENOUS | Status: DC
  Filled 2020-11-07: qty 4

## 2020-11-07 MED ORDER — ORAL CARE MOUTH RINSE
15.0000 mL | Freq: Two times a day (BID) | OROMUCOSAL | Status: DC
  Administered 2020-11-09: 15 mL via OROMUCOSAL

## 2020-11-07 NOTE — ED Notes (Signed)
Daughter at bedside, updated on plan of care, daughter states "I probably shouldn't have let him sit outside in the heat for so long"

## 2020-11-07 NOTE — ED Provider Notes (Signed)
Emergency Department Provider Note   I have reviewed the triage vital signs and the nursing notes.   HISTORY  Chief Complaint Code Stroke (Right sided weakness lkw 1245)   HPI Eduardo Vasquez is a 87 y.o. male with PMH reviewed below presents to the ED by EMS with acute onset mental status change and left side weakness. LSN at 12:45 PM.  Patient was at home with his daughter sitting on the front porch.  According to EMS, the daughter went inside to get him some water and when she came out he was laying on the ground, less responsive.  The fall was not witnessed.  No known seizure activity.  EMS arrived to find the patient drowsy and had left side weakness along with slurred speech which is new.  EMS reports some improvement en route.   Level 5 caveat: Stroke symptoms.   Past Medical History:  Diagnosis Date   Cancer [facility]) 2009   Prostate   Carotid artery occlusion    Dementia    Hypercholesteremia    Hypertension    Stroke [facility]) 2003   Mini - Left Eye- Partial Blindness   Urgency incontinence     Patient Active Problem List   Diagnosis Date Noted   Stroke determined by clinical assessment ([place]) 11/07/2020   Occlusion and stenosis of carotid artery without mention of cerebral infarction 11/03/2011    Past Surgical History:  Procedure Laterality Date   INGUINAL HERNIA REPAIR Right 12/02/2013   Procedure: HERNIA REPAIR INGUINAL ADULT WITH MESH;  Surgeon: Jamesetta So, MD;  Location: [facility]S;  Service: General;  Laterality: Right;   INSERTION OF MESH Right 12/02/2013   Procedure: INSERTION OF MESH;  Surgeon: Jamesetta So, MD;  Location: [facility] ORS;  Service: General;  Laterality: Right;    Allergies Patient has no known allergies.  Family History  Problem Relation Age of Onset   Hypertension Mother    Hyperlipidemia Mother    Hypertension Father    Hyperlipidemia Father    Diabetes Daughter    Prostate cancer Brother    Hypertension Brother     Social  History Social History   Tobacco Use   Smoking status: Never   Smokeless tobacco: Never  Substance Use Topics   Alcohol use: No    Alcohol/week: 0.0 standard drinks    Comment: 27 years alcohol free in AA   Drug use: No    Review of Systems  Level 5 caveat: AMS - stroke symptoms   ____________________________________________   PHYSICAL EXAM:  VITAL SIGNS: Vitals:   11/07/20 1455 11/07/20 1500  BP: (!) 144/87 (!) 145/69  Pulse: 72 75  Resp: 16 11  Temp: 98.1 F (36.7 C)   SpO2: 98% 100%     Constitutional: Slightly drowsy but able to follow commands.  Eyes: Conjunctivae are normal. PERRL.  Head: Atraumatic. Nose: No congestion/rhinnorhea. Mouth/Throat: Mucous membranes are moist.   Neck: No stridor.   Cardiovascular: Normal rate, regular rhythm. Good peripheral circulation. Grossly normal heart sounds.   Respiratory: Normal respiratory effort.  No retractions. Lungs CTAB. Gastrointestinal: Soft and nontender. No distention.  Musculoskeletal: No lower extremity tenderness nor edema. No gross deformities of extremities. Neurologic: Mild dysarthria noted. No face droop. LUE and LLE weakness 4+/5 but able to resist gravity.  Skin:  Skin is warm, dry and intact. No rash noted.   ____________________________________________   LABS (all labs ordered are listed, but only abnormal results are displayed)  Labs Reviewed  COMPREHENSIVE  METABOLIC PANEL - Abnormal; Notable for the following components:      Result Value   Glucose, Bld 169 (*)    Creatinine, Ser 2.02 (*)    Calcium 8.2 (*)    GFR, Estimated 31 (*)    All other components within normal limits  CBG MONITORING, ED - Abnormal; Notable for the following components:   Glucose-Capillary 166 (*)    All other components within normal limits  RESP PANEL BY RT-PCR (FLU A&B, COVID) ARPGX2  ETHANOL  CBC  DIFFERENTIAL  RAPID URINE DRUG SCREEN, HOSP PERFORMED  URINALYSIS, ROUTINE W REFLEX MICROSCOPIC   PROTIME-INR  APTT  I-STAT CHEM 8, ED   ____________________________________________  EKG   EKG Interpretation  Date/Time:  Saturday November 07 2020 13:58:28 EDT Ventricular Rate:  67 PR Interval:  273 QRS Duration: 139 QT Interval:  441 QTC Calculation: 466 R Axis:   -64 Text Interpretation: Sinus rhythm Prolonged PR interval RBBB and LAFB Confirmed by Nanda Quinton [phone]) on 11/07/2020 3:02:13 PM        ____________________________________________  RADIOLOGY  CT HEAD CODE STROKE WO CONTRAST  Result Date: 11/07/2020 CLINICAL DATA:  Code stroke. Neuro deficit, acute, stroke suspected. Found down. Right-sided weakness. EXAM: CT HEAD WITHOUT CONTRAST TECHNIQUE: Contiguous axial images were obtained from the base of the skull through the vertex without intravenous contrast. COMPARISON:  Head MRI 10/09/2015 FINDINGS: Brain: There is no evidence of an acute infarct, intracranial hemorrhage, mass, midline shift, or extra-axial fluid collection. Confluent hypodensities in the cerebral white matter bilaterally are nonspecific but compatible with severe chronic small vessel ischemic disease which has progressed from the prior MRI. There is a chronic lacunar infarct in the left caudate nucleus. There is mild global cerebral atrophy with asymmetric volume loss noted in the mesial right temporal lobe. Vascular: Calcified atherosclerosis at the skull base. No hyperdense vessel. Skull: No fracture or suspicious osseous lesion. Sinuses/Orbits: Visualized paranasal sinuses and mastoid air cells are clear. Unremarkable orbits. Other: None. ASPECTS [facility] Stroke Program Early CT Score) - Ganglionic level infarction (caudate, lentiform nuclei, internal capsule, insula, M1-M3 cortex): 7 - Supraganglionic infarction (M4-M6 cortex): 3 Total score (0-10 with 10 being normal): 10 IMPRESSION: 1. No evidence of acute intracranial abnormality.  ASPECTS of 10. 2. Severe chronic small vessel ischemic disease. These  results were called by telephone at the time of interpretation on 11/07/2020 at 1:43 pm to Dr. Nanda Quinton, who verbally acknowledged these results. Electronically Signed   By: Logan Bores M.D.   On: 11/07/2020 13:43    ____________________________________________   PROCEDURES  Procedure(s) performed:   Procedures  CRITICAL CARE Performed by: Margette Fast Total critical care time: 35 minutes Critical care time was exclusive of separately billable procedures and treating other patients. Critical care was necessary to treat or prevent imminent or life-threatening deterioration. Critical care was time spent personally by me on the following activities: development of treatment plan with patient and/or surrogate as well as nursing, discussions with consultants, evaluation of patient's response to treatment, examination of patient, obtaining history from patient or surrogate, ordering and performing treatments and interventions, ordering and review of laboratory studies, ordering and review of radiographic studies, pulse oximetry and re-evaluation of patient's condition.  Nanda Quinton, MD Emergency Medicine  ____________________________________________   INITIAL IMPRESSION / ASSESSMENT AND PLAN / ED COURSE  Pertinent labs & imaging results that were available during my care of the patient were reviewed by me and considered in my medical decision making (see chart  for details).   Patient presents emergency department with strokelike symptoms.  Last normal at 12:45 PM as he was there at his home with family.  Patient has some left side weakness and speech change which is new.  Deficits seem to be improving per EMS in route but continues to have been here on arrival.  Code stroke activated.  Differential includes intracranial hemorrhage as well as seizure. No stigmata of seizure.   Discussed case with Radiology. No ICH or other acute findings.   Patient offered tPA by the Neuro service. Plan  for admit to [facility] ICU. BP not requiring mgmt. Labs and EKG reviewed.   Discussed patient's case with Neuro to request admission. Patient and family (if present) updated with plan. Care transferred to Neuro service.  I reviewed all nursing notes, vitals, pertinent old records, EKGs, labs, imaging (as available).  ____________________________________________  FINAL CLINICAL IMPRESSION(S) / ED DIAGNOSES  Final diagnoses:  Left-sided weakness     MEDICATIONS GIVEN DURING THIS VISIT:  Medications  alteplase (ACTIVASE) 1 mg/mL infusion SOLN 69 mg (69 mg Intravenous New Bag/Given 11/07/20 1411)    Followed by  0.9 %  sodium chloride infusion (has no administration in time range)  labetalol (NORMODYNE) injection 20 mg (has no administration in time range)    And  clevidipine (CLEVIPREX) infusion 0.5 mg/mL (has no administration in time range)     Note:  This document was prepared using Dragon voice recognition software and may include unintentional dictation errors.  Nanda Quinton, MD, [facility] Emergency Medicine    Zakaiya Lares, [facility], MD 11/07/20 [phone]

## 2020-11-07 NOTE — ED Notes (Signed)
RN at bedside for repeat labs d/t unforseen delay

## 2020-11-07 NOTE — Consult Note (Signed)
[facility] Telemedicine Consult   Requesting Provider: Dr Laverta [place] Consult Participants: Dr. Jerelyn Kyshon, Telespecialist RN Misty   Bedside RN Mickel Baas Location of the [facility] Location of the patient: [place]     This consult was provided via telemedicine with 2-way video and audio communication. The patient/family was informed that care would be provided in this way and agreed to receive care in this manner.   Chief Complaint: left sided weakness  HPI:  87/m with PMH of dementia, HTN, HLD, ?stroke many years ago with no residual deficits, mRS 3, LKW at 1245 today at home when family noted sudden onset of him being slumped o the chair on the porch. Lives with her daughter, she said that last seen normal was 1245 when she saw him sitting on the porch, he asked for water which she had given to him 15 to 20 minutes ago and then she looked outside and found him slumped on the chair.  She called his name, went and saw him, no response, then he slowly came around but still could not recognize her and within a few minutes it did open his eyes and said her name.  He said a few words but he was not making complete sense 911 was called.  No history of seizures.  No reported witnessed seizure. Code stroke was not activated by EMS and was activated in the ER.  Past Medical History:  Diagnosis Date   Cancer [facility]) 2009   Prostate   Carotid artery occlusion    Dementia    Hypercholesteremia    Hypertension    Stroke [facility]) 2003   Mini - Left Eye- Partial Blindness   Urgency incontinence      Current Facility-Administered Medications:    alteplase (ACTIVASE) 1 mg/mL infusion SOLN 69 mg, 0.9 mg/kg, Intravenous, Once **FOLLOWED BY** 0.9 %  sodium chloride infusion, 50 mL, Intravenous, Once, Amie Portland, MD   labetalol (NORMODYNE) injection 20 mg, 20 mg, Intravenous, Once **AND** clevidipine (CLEVIPREX) infusion 0.5 mg/mL, 0-21 mg/hr, Intravenous, Continuous, Amie Portland, MD  Current Outpatient  Medications:    amLODipine (NORVASC) 10 MG tablet, 10 mg daily., Disp: , Rfl:    aspirin EC 81 MG tablet, Take 81 mg by mouth daily., Disp: , Rfl:    donepezil (ARICEPT) 10 MG tablet, TAKE 1 TABLET(10 MG) BY MOUTH AT BEDTIME, Disp: 30 tablet, Rfl: 0   doxazosin (CARDURA) 4 MG tablet, Take 4 mg by mouth daily., Disp: , Rfl:    LUMIGAN 0.01 % SOLN, As needed, Disp: , Rfl: 12   memantine (NAMENDA) 10 MG tablet, Take 1 tablet (10 mg total) by mouth 2 (two) times daily., Disp: 60 tablet, Rfl: 12   Multiple Vitamin (MULTIVITAMIN) tablet, Take 1 tablet by mouth daily., Disp: , Rfl:    pravastatin (PRAVACHOL) 80 MG tablet, Take 80 mg by mouth daily., Disp: , Rfl:    Tamsulosin HCl (FLOMAX) 0.4 MG CAPS, Take 0.4 mg by mouth daily., Disp: , Rfl:   LKW: 1245 hrs tpa given?:yes IR Thrombectomy? No,  Modified Rankin Scale: 3-Moderate disability-requires help but walks WITHOUT assistance Time of teleneurologist evaluation: 1338 hrs  Exam: Vitals:   11/07/20 1346 11/07/20 1400  BP: 134/72 140/67  Pulse: 71 66  Resp: 14 17  SpO2: 97% 99%    General: Awake with voice but keeps falling asleep, in NAD   Neurological exam He is mostly awake but keeps falling asleep during the encounter and require some stimulation to stay up. Speech is mildly  dysarthric Has mild aphasia as well-repetition intact, naming slightly impaired and could not describe the picture completely. Cranial nerves: Pupils equal round react light, extraocular movements appear intact, visual fields appear full to threat, face appears symmetric. Motor examination: There is upper extremity and lower extremity drift on the left side.  Right side has no drift. Sensation intact No dysmetria   NIHSS 1A: Level of Consciousness - 0 1B: Ask Month and Age - 2 1C: 'Blink Eyes' & 'Squeeze Hands' - 0 2: Test Horizontal Extraocular Movements - 0 3: Test Visual Fields - 0 4: Test Facial Palsy - 0 5A: Test Left Arm Motor Drift - 1 5B: Test  Right Arm Motor Drift - 0 6A: Test Left Leg Motor Drift - 1 6B: Test Right Leg Motor Drift - 0 7: Test Limb Ataxia - 0 8: Test Sensation - 0 9: Test Language/Aphasia- 1 10: Test Dysarthria - 1 11: Test Extinction/Inattention - 0 NIHSS score: 6   Imaging Reviewed:  CTH with no acute changes  Labs reviewed in epic and pertinent values follow: CBC    Component Value Date/Time   WBC 6.1 06/09/2020 0955   RBC 4.50 06/09/2020 0955   HGB 12.2 (L) 06/09/2020 0955   HCT 38.7 (L) 06/09/2020 0955   PLT 190 06/09/2020 0955   MCV 86.0 06/09/2020 0955   MCH 27.1 06/09/2020 0955   MCHC 31.5 06/09/2020 0955   RDW 16.9 (H) 06/09/2020 0955   LYMPHSABS 1.9 06/09/2020 0955   MONOABS 0.6 06/09/2020 0955   EOSABS 0.1 06/09/2020 0955   BASOSABS 0.1 06/09/2020 0955   CMP     Component Value Date/Time   NA 139 06/09/2020 0955   K 3.3 (L) 06/09/2020 0955   CL 107 06/09/2020 0955   CO2 24 06/09/2020 0955   GLUCOSE 178 (H) 06/09/2020 0955   BUN 17 06/09/2020 0955   CREATININE 1.60 (H) 06/09/2020 0955   CALCIUM 8.5 (L) 06/09/2020 0955   PROT 7.1 06/09/2020 0955   ALBUMIN 3.3 (L) 06/09/2020 0955   AST 25 06/09/2020 0955   ALT 18 06/09/2020 0955   ALKPHOS 59 06/09/2020 0955   BILITOT 0.7 06/09/2020 0955   GFRNONAA 41 (L) 06/09/2020 0955   GFRAA 47 (L) 11/27/2013 1255     Assessment:  87 year old man with a history of dementia, possible prior stroke without residual deficits, hypertension, hypercholesterolemia presenting for sudden onset of unresponsiveness following which he has been weak on the left side and has mild slurred speech. NIH stroke scale a total of 6 No family at bedside-delay for tPA due to trying to get a hold of family member-daughter Ms. Portia Flaharty Risks benefits of tPA discused in detail with Ms Armoni Galante. She asked questions and then finally decided to proceed with tPA.  CT reviewed prior to tPA administration-negative for bleed. tPA mixing instruction provided  at 1404 and bolus given at 1415 hrs  Impression: - Evaluate for acute ischemic stroke-discussed with family, within window for IV tPA, risk benefits discussed and IV tPA administered - Evaluate for stroke mimics such as toxic metabolic derangements/UTI etc  Recommendations:  -Transfer to [facility] [place] accepting the patient under her service -Post tPA vitals -SBP<180 (PRN labetalol and cleviprex gtt ordered) -No anti-platelets or anticoagulants for 24 hours from tPA. -Okay to start aspirin after 24 hours if the brain imaging is negative for bleed. -Medical management per the admitting service for CKD-has AKI on CKD based on the preliminary BMP results creatinine greater than  2. -NS 75cc/h -Stroke work-up to include MRA head without contrast, MRI brain without contrast, carotid Dopplers-not pursuing CTA due to deranged renal function.  Check A1c and lipid panel.  Check 2D echocardiogram. -Check UA, CXR  Discussed with ED provider Dr. Laverta Baltimore and [facility] neuro hospitalist Dr. Curly Shores over a multi way [facility] call  This patient is receiving care for possible acute neurological changes. There was 55 minutes of care by this provider at the time of service, including time for direct evaluation via telemedicine, review of medical records, imaging studies and discussion of findings with providers, the patient and/or family.  -- Amie Portland, MD [facility] Pager: [phone] If 7pm to 7am, please call on call as listed on AMION.  CRITICAL CARE ATTESTATION Performed by: Amie Portland, MD Total critical care time: 55 minutes Critical care time was exclusive of separately billable procedures and treating other patients and/or supervising APPs/Residents/Students Critical care was necessary to treat or prevent imminent or life-threatening deterioration due to acute ischemic stroke versus stroke mimic This patient is critically ill and at significant risk for neurological  worsening and/or death and care requires constant monitoring. Critical care was time spent personally by me on the following activities: development of treatment plan with patient and/or surrogate as well as nursing, discussions with consultants, evaluation of patient's response to treatment, examination of patient, obtaining history from patient or surrogate, ordering and performing treatments and interventions, ordering and review of laboratory studies, ordering and review of radiographic studies, pulse oximetry, re-evaluation of patient's condition, participation in multidisciplinary rounds and medical decision making of high complexity in the care of this patient.

## 2020-11-07 NOTE — ED Notes (Signed)
Tobi Bastos gave Collet[place] permission to talk with Dr and be put on pt list. [phone]

## 2020-11-07 NOTE — H&P (Signed)
Admission H&P    Chief Complaint: Right sided weakness  HPI: Eduardo Vasquez is an 87 y.o. 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 LKN was 12:45 PM. The patient was at home with his daughter sitting on the front porch. The daughter went inside to get him some water and when she came out he was laying on the ground, less responsive. The transition from seated position to the ground was not wtnessed but was suspected to be a fall. No known seizure activity.  He was drowsy with left sided weakness and slurred speech on EMS arrival to his home. There was some improvement per EMS en route.   Teleneurology consult was obtained. NIHSS 6. The patient was determined to be a candidate for IV tPA administration, with bolus administered at 1415. .    Dr. Johny Chess Teleneurology note was reviewed: "89/m with PMH of dementia, HTN, HLD, ?stroke many years ago with no residual deficits, mRS 3, LKW at 38 today at home when family noted sudden onset of him being slumped o the chair on the porch. Lives with her daughter, she said that last seen normal was 1245 when she saw him sitting on the porch, he asked for water which she had given to him 15 to 20 minutes ago and then she looked outside and found him slumped on the chair.  She called his name, went and saw him, no response, then he slowly came around but still could not recognize her and within a few minutes it did open his eyes and said her name.  He said a few words but he was not making complete sense. 911 was called.  No history of seizures.  No reported witnessed seizure. Code stroke was not activated by EMS and was activated in the ER."  LSN: [id number] tPA Given: Yes mRS: 3  Past Medical History:  Diagnosis Date   Cancer ([place]) 2009   Prostate   Carotid  artery occlusion    Dementia    Hypercholesteremia    Hypertension    Stroke [facility]) 2003   Mini - Left Eye- Partial Blindness   Urgency incontinence     Past Surgical History:  Procedure Laterality Date   INGUINAL HERNIA REPAIR Right 12/02/2013   Procedure: HERNIA REPAIR INGUINAL ADULT WITH MESH;  Surgeon: Jamesetta So, MD;  Location: [facility];  Service: General;  Laterality: Right;   INSERTION OF MESH Right 12/02/2013   Procedure: INSERTION OF MESH;  Surgeon: Jamesetta So, MD;  Location: [facility];  Service: General;  Laterality: Right;    Family History  Problem Relation Age of Onset   Hypertension Mother    Hyperlipidemia Mother    Hypertension Father    Hyperlipidemia Father    Diabetes Daughter    Prostate cancer Brother    Hypertension Brother    Social History:  reports that he has never smoked. He has never used smokeless tobacco. He reports that he does not drink alcohol and does not use drugs.  Allergies: No Known Allergies  Medications Prior to Admission  Medication Sig Dispense Refill   amLODipine (NORVASC) 10 MG tablet Take 10 mg by mouth daily.     aspirin EC 81 MG tablet Take 81 mg by mouth daily.     donepezil (ARICEPT) 10 MG tablet TAKE  1 TABLET(10 MG) BY MOUTH AT BEDTIME (Patient taking differently: Take 10 mg by mouth at bedtime.) 30 tablet 0   doxazosin (CARDURA) 4 MG tablet Take 4 mg by mouth daily.     levothyroxine (SYNTHROID) 75 MCG tablet Take 75 mcg by mouth daily.     LUMIGAN 0.01 % SOLN Place 1 drop into both eyes daily as needed (dry eyes). As needed  12   memantine (NAMENDA) 10 MG tablet Take 1 tablet (10 mg total) by mouth 2 (two) times daily. 60 tablet 12   Multiple Vitamin (MULTIVITAMIN) tablet Take 1 tablet by mouth daily.     pravastatin (PRAVACHOL) 80 MG tablet Take 80 mg by mouth daily.     Tamsulosin HCl (FLOMAX) 0.4 MG CAPS Take 0.4 mg by mouth daily.      ROS: Unable to obtain a detailed ROS due to cognitive impairment. Is not  complaining of any pain.   Physical Examination: Blood pressure (!) 145/94, pulse 74, temperature 98 F (36.7 C), temperature source Oral, resp. rate 13, height 6' (1.829 m), weight 76.7 kg, SpO2 99 %.  HEENT-  [place]/AT  Lungs - Respirations unlabored Extremities - No edema  Neurologic Examination: Mental Status: Awake and alert. Speech is sparse but fluent, with no dysarthria. Has difficulty with complex questions, but can perform all simple commands during exam. Able to name a thumb with some difficulty. Can name his ear. Could not name an index finger or pinky. Oriented to "[place]" and [place] but not the day of the week, the month or the year.  Cranial Nerves: II:  Does not follow commands for formal visual field testing. Does blink to threat in temporal visual fields of left and right eye. PERRL.  III,IV, VI: EOMI. No ptosis or nystagmus.  V,VII: Smile symmetric, gross facial touch sensation intact bilaterally VIII: hearing intact to voice IX,X: No hoarseness XI: Symmetric XII: midline tongue extension  Motor: RUE 4/5 biceps, triceps, grip and deltoid LUE 5/5 RLE 5/5 LLE 5/5 No pronator drift.  Sensory: Has difficulty understanding questions for testing. Reacts to FT stimuli in BUE and BLE without gross asymmetry. No extinction to DSS of palms.  Deep Tendon Reflexes:  2+ bilateral brachioradialis and patellae Plantars: Right: downgoing  Left: downgoing Cerebellar: No ataxia with FNF, which is slow and with intention tremor bilaterally  Gait: Deferred   Results for orders placed or performed during the hospital encounter of 11/07/20 (from the past 48 hour(s))  Ethanol     Status: None   Collection Time: 11/07/20  1:28 PM  Result Value Ref Range   Alcohol, Ethyl (B) <10 <10 mg/dL    Comment: (NOTE) Lowest detectable limit for serum alcohol is 10 mg/dL.  For medical purposes only. Performed at [facility], [address], [place], [place] [address]   CBC     Status:  Abnormal   Collection Time: 11/07/20  1:28 PM  Result Value Ref Range   WBC 7.7 4.0 - 10.5 K/uL   RBC 4.87 4.22 - 5.81 MIL/uL   Hemoglobin 13.2 13.0 - 17.0 g/dL   HCT 41.6 39.0 - 52.0 %   MCV 85.4 80.0 - 100.0 fL   MCH 27.1 26.0 - 34.0 pg   MCHC 31.7 30.0 - 36.0 g/dL   RDW 16.5 (H) 11.5 - 15.5 %   Platelets 169 150 - 400 K/uL   nRBC 0.0 0.0 - 0.2 %    Comment: Performed at [facility], [address] [place], [place]  [address]  Differential     Status: Abnormal   Collection Time: 11/07/20  1:28 PM  Result Value Ref Range   Neutrophils Relative % 65 %   Neutro Abs 5.0 1.7 - 7.7 K/uL   Lymphocytes Relative 21 %   Lymphs Abs 1.6 0.7 - 4.0 K/uL   Monocytes Relative 9 %   Monocytes Absolute 0.7 0.1 - 1.0 K/uL   Eosinophils Relative 3 %   Eosinophils Absolute 0.2 0.0 - 0.5 K/uL   Basophils Relative 1 %   Basophils Absolute 0.1 0.0 - 0.1 K/uL   Immature Granulocytes 1 %   Abs Immature Granulocytes 0.09 (H) 0.00 - 0.07 K/uL    Comment: Performed at [facility], [address], [place], [place] [address]  Comprehensive metabolic panel     Status: Abnormal   Collection Time: 11/07/20  1:28 PM  Result Value Ref Range   Sodium 136 135 - 145 mmol/L   Potassium 3.8 3.5 - 5.1 mmol/L   Chloride 106 98 - 111 mmol/L   CO2 23 22 - 32 mmol/L   Glucose, Bld 169 (H) 70 - 99 mg/dL    Comment: Glucose reference range applies only to samples taken after fasting for at least 8 hours.   BUN 20 8 - 23 mg/dL   Creatinine, Ser 2.02 (H) 0.61 - 1.24 mg/dL   Calcium 8.2 (L) 8.9 - 10.3 mg/dL   Total Protein 7.7 6.5 - 8.1 g/dL   Albumin 3.5 3.5 - 5.0 g/dL   AST 22 15 - 41 U/L   ALT 16 0 - 44 U/L   Alkaline Phosphatase 71 38 - 126 U/L   Total Bilirubin 0.5 0.3 - 1.2 mg/dL   GFR, Estimated 31 (L) >60 mL/min    Comment: (NOTE) Calculated using the CKD-EPI Creatinine Equation (2021)    Anion gap 7 5 - 15    Comment: Performed at [facility], [address], [place], [place] [address]  Resp Panel by  RT-PCR (Flu A&B, Covid) Nasopharyngeal Swab     Status: None   Collection Time: 11/07/20  1:28 PM   Specimen: Nasopharyngeal Swab; Nasopharyngeal(NP) swabs in vial transport medium  Result Value Ref Range   SARS Coronavirus 2 by RT PCR NEGATIVE NEGATIVE    Comment: (NOTE) SARS-CoV-2 target nucleic acids are NOT DETECTED.  The SARS-CoV-2 RNA is generally detectable in upper respiratory specimens during the acute phase of infection. The lowest concentration of SARS-CoV-2 viral copies this assay can detect is 138 copies/mL. A negative result does not preclude SARS-Cov-2 infection and should not be used as the sole basis for treatment or other patient management decisions. A negative result may occur with  improper specimen collection/handling, submission of specimen other than nasopharyngeal swab, presence of viral mutation(s) within the areas targeted by this assay, and inadequate number of viral copies(<138 copies/mL). A negative result must be combined with clinical observations, patient history, and epidemiological information. The expected result is Negative.  Fact Sheet for Patients:  EntrepreneurPulse.com.au  Fact Sheet for Healthcare Providers:  IncredibleEmployment.be  This test is no t yet approved or cleared by the [place] FDA and  has been authorized for detection and/or diagnosis of SARS-CoV-2 by FDA under an Emergency Use Authorization (EUA). This EUA will remain  in effect (meaning this test can be used) for the duration of the COVID-19 declaration under Section 564(b)(1) of the Act, 21 U.S.C.section 360bbb-3(b)(1), unless the authorization is terminated  or revoked sooner.  Influenza A by PCR NEGATIVE NEGATIVE   Influenza B by PCR NEGATIVE NEGATIVE    Comment: (NOTE) The Xpert Xpress SARS-CoV-2/FLU/RSV plus assay is intended as an aid in the diagnosis of influenza from Nasopharyngeal swab specimens and should not be  used as a sole basis for treatment. Nasal washings and aspirates are unacceptable for Xpert Xpress SARS-CoV-2/FLU/RSV testing.  Fact Sheet for Patients: EntrepreneurPulse.com.au  Fact Sheet for Healthcare Providers: IncredibleEmployment.be  This test is not yet approved or cleared by the [place] FDA and has been authorized for detection and/or diagnosis of SARS-CoV-2 by FDA under an Emergency Use Authorization (EUA). This EUA will remain in effect (meaning this test can be used) for the duration of the COVID-19 declaration under Section 564(b)(1) of the Act, 21 U.S.C. section 360bbb-3(b)(1), unless the authorization is terminated or revoked.  Performed at [facility], [address] [place], [place] [address]   CBG monitoring, ED     Status: Abnormal   Collection Time: 11/07/20  1:41 PM  Result Value Ref Range   Glucose-Capillary 166 (H) 70 - 99 mg/dL    Comment: Glucose reference range applies only to samples taken after fasting for at least 8 hours.  Protime-INR     Status: None   Collection Time: 11/07/20  3:24 PM  Result Value Ref Range   Prothrombin Time 13.6 11.4 - 15.2 seconds   INR 1.0 0.8 - 1.2    Comment: (NOTE) INR goal varies based on device and disease states. Performed at [facility], [address], [place], [place] [address]   APTT     Status: None   Collection Time: 11/07/20  3:24 PM  Result Value Ref Range   aPTT 25 24 - 36 seconds    Comment: Performed at [facility], [address], [place], [place] [address]  CBG monitoring, ED     Status: Abnormal   Collection Time: 11/07/20  5:44 PM  Result Value Ref Range   Glucose-Capillary 119 (H) 70 - 99 mg/dL    Comment: Glucose reference range applies only to samples taken after fasting for at least 8 hours.  Glucose, capillary     Status: Abnormal   Collection Time: 11/07/20  7:45 PM  Result Value Ref Range   Glucose-Capillary 138 (H) 70 - 99 mg/dL    Comment:  Glucose reference range applies only to samples taken after fasting for at least 8 hours.   DG Chest 1 View  Result Date: 11/07/2020 CLINICAL DATA:  Stroke/hx prostate ca/dementia/htn/non smoker Covid neg today EXAM: CHEST  1 VIEW COMPARISON:  None. FINDINGS: The heart and mediastinal contours are within normal limits. No focal consolidation. No pulmonary edema. No pleural effusion. No pneumothorax. No acute osseous abnormality. IMPRESSION: No active disease. Electronically Signed   By: Iven Finn M.D.   On: 11/07/2020 15:10   CT HEAD CODE STROKE WO CONTRAST  Result Date: 11/07/2020 CLINICAL DATA:  Code stroke. Neuro deficit, acute, stroke suspected. Found down. Right-sided weakness. EXAM: CT HEAD WITHOUT CONTRAST TECHNIQUE: Contiguous axial images were obtained from the base of the skull through the vertex without intravenous contrast. COMPARISON:  Head MRI 10/09/2015 FINDINGS: Brain: There is no evidence of an acute infarct, intracranial hemorrhage, mass, midline shift, or extra-axial fluid collection. Confluent hypodensities in the cerebral white matter bilaterally are nonspecific but compatible with severe chronic small vessel ischemic disease which has progressed from the prior MRI. There is a chronic lacunar infarct in the left caudate nucleus. There is mild  global cerebral atrophy with asymmetric volume loss noted in the mesial right temporal lobe. Vascular: Calcified atherosclerosis at the skull base. No hyperdense vessel. Skull: No fracture or suspicious osseous lesion. Sinuses/Orbits: Visualized paranasal sinuses and mastoid air cells are clear. Unremarkable orbits. Other: None. ASPECTS [facility] Stroke Program Early CT Score) - Ganglionic level infarction (caudate, lentiform nuclei, internal capsule, insula, M1-M3 cortex): 7 - Supraganglionic infarction (M4-M6 cortex): 3 Total score (0-10 with 10 being normal): 10 IMPRESSION: 1. No evidence of acute intracranial abnormality.  ASPECTS of 10. 2.  Severe chronic small vessel ischemic disease. These results were called by telephone at the time of interpretation on 11/07/2020 at 1:43 pm to Dr. Nanda Quinton, who verbally acknowledged these results. Electronically Signed   By: Logan Bores M.D.   On: 11/07/2020 13:43    Assessment: 87 y.o. male who initially presented to the [facility] with acute onset of confusion and right sided weakness after brief LOC at home. He received IV tPA for presumed stroke and was transferred to [facility] for further evaluation and management.  1. Exam reveals cognitive impairment with disorientation. There is mild RUE weakness, but no facial droop, visual field cut, ataxia or gross sensory loss.  2. CT head:  No evidence of acute intracranial abnormality.  ASPECTS of 10. Severe chronic small vessel ischemic disease. 3. Stroke Risk Factors - cancer, prior carotid artery occlusion, hypercholesterolemia, HTN and prior stroke 4. AKI 5. EKG: Sinus rhythm, Prolonged PR interval, RBBB and LAFB  Plan: 1. Admitting to Neuro ICU.  2. Post-tPA order set to include frequent neuro checks and BP management.  3. No antiplatelet medications or anticoagulants for at least 24 hours following tPA.  4. DVT prophylaxis with SCDs.  5. Restart ASA if repeat CT head 24 hours after tPA is negative for hemorrhage. The patient was on ASA at home. Stroke team to assess indications for possible DAPT.  6. TTE.  7. Carotid ultrasound 8. MRI brain, MRA head.  9. NPO until passes swallow evaluation.  10. PT/OT/Speech.  11. Telemetry monitoring 12. Fasting lipid panel, HgbA1c 13. IVF 14. Continue donepezil and synthroid. Will hold memantine as it has a paradoxical side effect of worsened mentation in some patients.  15. TSH level.    Electronically signed: Dr. Kerney Elbe 11/07/2020, 7:53 PM

## 2020-11-08 ENCOUNTER — Inpatient Hospital Stay (HOSPITAL_COMMUNITY): Payer: Medicare Other

## 2020-11-08 LAB — LIPID PANEL
Cholesterol: 144 mg/dL (ref 0–200)
HDL: 52 mg/dL (ref >40)
LDL Cholesterol: 82 mg/dL (ref 0–99)
Total CHOL/HDL Ratio: 2.8 RATIO
Triglycerides: 48 mg/dL (ref <150)
VLDL: 10 mg/dL (ref 0–40)

## 2020-11-08 LAB — HEMOGLOBIN A1C
Hgb A1c MFr Bld: 6.4 % — ABNORMAL HIGH (ref 4.8–5.6)
Mean Plasma Glucose: 136.98 mg/dL

## 2020-11-08 LAB — GLUCOSE, CAPILLARY
Glucose-Capillary: 113 mg/dL — ABNORMAL HIGH (ref 70–99)
Glucose-Capillary: 117 mg/dL — ABNORMAL HIGH (ref 70–99)
Glucose-Capillary: 173 mg/dL — ABNORMAL HIGH (ref 70–99)
Glucose-Capillary: 99 mg/dL (ref 70–99)

## 2020-11-08 LAB — TSH: TSH: 2.399 u[IU]/mL (ref 0.350–4.500)

## 2020-11-08 MED ORDER — MEMANTINE HCL 10 MG PO TABS
10.0000 mg | ORAL_TABLET | Freq: Two times a day (BID) | ORAL | Status: DC
  Administered 2020-11-08 – 2020-11-09 (×2): 10 mg via ORAL
  Filled 2020-11-08 (×3): qty 1

## 2020-11-08 MED ORDER — ASPIRIN 81 MG PO CHEW: 81.0000 mg | CHEWABLE_TABLET | Freq: Every day | ORAL | Status: DC

## 2020-11-08 MED ORDER — ATORVASTATIN CALCIUM 40 MG PO TABS
40.0000 mg | ORAL_TABLET | Freq: Every day | ORAL | Status: DC
  Administered 2020-11-09: 40 mg via ORAL
  Filled 2020-11-08: qty 1

## 2020-11-08 MED ORDER — CHLORHEXIDINE GLUCONATE CLOTH 2 % EX PADS
6.0000 | MEDICATED_PAD | Freq: Every day | CUTANEOUS | Status: DC
  Administered 2020-11-08: 6 via TOPICAL

## 2020-11-08 NOTE — Progress Notes (Addendum)
1326 call time 1327 beeper time 1331 exam started 1332 exam finished 1335 images sent to [place] Exam completed in epic 1337 [facility] radiology called

## 2020-11-08 NOTE — Progress Notes (Signed)
2D Echo attempted, patient about to move to MRI. Will re-attempt as schedule permits. [place]

## 2020-11-08 NOTE — Progress Notes (Signed)
STROKE TEAM PROGRESS NOTE   INTERVAL HISTORY 87 year old man, past history of dementia, hypertension,has AKI on CKD- may be that was the cause of his presentation but the focal weakness is new according to the daughter and prompted [facility] to not withhold tPA. Still awaiting MRI results to see if he has a stroke but exam does show left-sided weakness. Family at bedside. Patient sitting up in chair after working with PT, who stated that she did not notice any significant left side weakness. Patient still has some left facial droop. Will move him out of 11M.   Vitals:   11/08/20 0400 11/08/20 0500 11/08/20 0600 11/08/20 0746  BP: (!) 153/98 (!) 163/92 (!) 167/88   Pulse: 64 75 79   Resp: '14 15 18   '$ Temp:    (!) 97.5 F (36.4 C)  TempSrc:    Axillary  SpO2: 96% 98% 95%   Weight:      Height:       CBC:  Recent Labs  Lab 11/07/20 1328 11/07/20 2046  WBC 7.7 8.6  NEUTROABS 5.0  --   HGB 13.2 12.6*  HCT 41.6 38.3*  MCV 85.4 82.9  PLT 169 0000000   Basic Metabolic Panel:  Recent Labs  Lab 11/07/20 1328  NA 136  K 3.8  CL 106  CO2 23  GLUCOSE 169*  BUN 20  CREATININE 2.02*  CALCIUM 8.2*   Lipid Panel:  Recent Labs  Lab 11/08/20 0137  CHOL 144  TRIG 48  HDL 52  CHOLHDL 2.8  VLDL 10  LDLCALC 82   HgbA1c:  Recent Labs  Lab 11/08/20 0137  HGBA1C 6.4*    Alcohol Level  Recent Labs  Lab 11/07/20 1328  ETH <10    IMAGING past 24 hours DG Chest 1 View  Result Date: 11/07/2020 CLINICAL DATA:  Stroke/hx prostate ca/dementia/htn/non smoker Covid neg today EXAM: CHEST  1 VIEW COMPARISON:  None. FINDINGS: The heart and mediastinal contours are within normal limits. No focal consolidation. No pulmonary edema. No pleural effusion. No pneumothorax. No acute osseous abnormality. IMPRESSION: No active disease. Electronically Signed   By: Iven Finn M.D.   On: 11/07/2020 15:10   CT HEAD CODE STROKE WO CONTRAST  Result Date: 11/07/2020 CLINICAL DATA:  Code stroke. Neuro  deficit, acute, stroke suspected. Found down. Right-sided weakness. EXAM: CT HEAD WITHOUT CONTRAST TECHNIQUE: Contiguous axial images were obtained from the base of the skull through the vertex without intravenous contrast. COMPARISON:  Head MRI 10/09/2015 FINDINGS: Brain: There is no evidence of an acute infarct, intracranial hemorrhage, mass, midline shift, or extra-axial fluid collection. Confluent hypodensities in the cerebral white matter bilaterally are nonspecific but compatible with severe chronic small vessel ischemic disease which has progressed from the prior MRI. There is a chronic lacunar infarct in the left caudate nucleus. There is mild global cerebral atrophy with asymmetric volume loss noted in the mesial right temporal lobe. Vascular: Calcified atherosclerosis at the skull base. No hyperdense vessel. Skull: No fracture or suspicious osseous lesion. Sinuses/Orbits: Visualized paranasal sinuses and mastoid air cells are clear. Unremarkable orbits. Other: None. ASPECTS [facility] Stroke Program Early CT Score) - Ganglionic level infarction (caudate, lentiform nuclei, internal capsule, insula, M1-M3 cortex): 7 - Supraganglionic infarction (M4-M6 cortex): 3 Total score (0-10 with 10 being normal): 10 IMPRESSION: 1. No evidence of acute intracranial abnormality.  ASPECTS of 10. 2. Severe chronic small vessel ischemic disease. These results were called by telephone at the time of interpretation on 11/07/2020 at 1:43  pm to Dr. Nanda Quinton, who verbally acknowledged these results. Electronically Signed   By: Logan Bores M.D.   On: 11/07/2020 13:43    PHYSICAL EXAM  Constitutional: Appears well-developed and well-nourished.  Eyes: Normal external eye and conjunctiva. HENT: Normocephalic, no lesions, without obvious abnormality.   Musculoskeletal-no joint tenderness, deformity or swelling Cardiovascular: Normal rate and regular rhythm.  Respiratory: Effort normal, non-labored breathing saturations  WNL GI: Soft.  No distension. There is no tenderness.  Skin: WDI   Neuro:  Mental Status: Alert. Poor historian, baseline dementia. Speech fluent without evidence of aphasia but not conversant.  Able to follow simple commands. Slight dysarthria. Flat affect. Cranial Nerves: II:  Visual fields grossly normal, blinks to threat bilaterally III,IV, VI: ptosis not present, extra-ocular motions intact bilaterally pupils equal, round, reactive to light and accommodation V,VII: left-sided facial droop VIII: hearing normal to voice IX,X: uvula rises symmetrically XI: bilateral shoulder shrug XII: midline tongue extension Motor: Patient able to lift all 4 extremities anti-gravity however left side does appear weaker than right. Tone and bulk:normal tone throughout; no atrophy noted Sensory:  light touch intact throughout, bilaterally Cerebellar: No ataxia noted Gait:deferred     ASSESSMENT/PLAN [name] is a 87 y.o. male  with a PMHx of prostate cancer, carotid artery occlusion, dementia, hypercholesterolemia, HTN and left eye partial blindness attributed to stroke, who presented to the [facility] ED on Saturday afternoon with acute onset of AMS and left sided weakness. LKN was 12:45 PM. The patient was at home with his daughter sitting on the front porch. The daughter went inside to get him some water and when she came out he was laying on the ground, less responsive. The transition from seated position to the ground was not wtnessed but was suspected to be a fall. No known seizure activity.  He was drowsy with left sided weakness and slurred speech on EMS arrival to his home. There was some improvement per EMS en route.    Teleneurology consult was obtained. NIHSS 6. The patient was determined to be a candidate for IV tPA administration, with bolus administered at 1415. .     Dr. Johny Chess Teleneurology note was reviewed: "89/m with PMH of dementia, HTN, HLD, ?stroke many years ago with no  residual deficits, mRS 3, LKW at 4 today at home when family noted sudden onset of him being slumped o the chair on the porch. Lives with her daughter, she said that last seen normal was 1245 when she saw him sitting on the porch, he asked for water which she had given to him 15 to 20 minutes ago and then she looked outside and found him slumped on the chair.  She called his name, went and saw him, no response, then he slowly came around but still could not recognize her and within a few minutes it did open his eyes and said her name.  He said a few words but he was not making complete sense. 911 was called.  No history of seizures.  No reported witnessed seizure. Code stroke was not activated by EMS and was activated in the ER.  Stroke vs TIA:   Code Stroke CT head No acute abnormality. ASPECTS 10.    MRI  pending MRA  pending Carotid Doppler  pending 2D Echo pending LDL 82 HgbA1c 6.4 VTE prophylaxis - SCD's    Diet   Diet NPO time specified   aspirin 81 mg daily prior to admission, now on No antithrombotic  start ASA 24 hours post TPA. Therapy recommendations:  home health OT/PT Disposition:  home health OT./PT  Hypertension Home meds:  norvasc, CArdura Stable Permissive hypertension (OK if < 220/120) but gradually normalize in 5-7 days Long-term BP goal normotensive  Hyperlipidemia Home meds:  pravastatin 80,  LDL 82, goal < 70 Change to atorvastatin 40 mg Continue statin at discharge  Diabetes type II Controlled Home meds:  none HgbA1c 6.4, goal < 7.0 CBGs Recent Labs    11/07/20 2322 11/08/20 0330 11/08/20 0731  GLUCAP 138* 113* 117*    Other Stroke Risk Factors Advanced Age >/= 97  Hx stroke  Other Active Problems Dementia- continue namenda and donepezil  Hospital day # [place], MSN, NP-C [facility] [phone]   Personally examined patient and images, and have participated in and made any corrections needed to history,  physical, neuro exam,assessment and plan as stated above.  I have personally obtained the history, evaluated lab date, reviewed imaging studies and agree with radiology interpretations.    Sarina Ill, MD Stroke Neurology  I spent 35  minutes of face-to-face and non-face-to-face time with patient. This included prechart review, lab review, study review, order entry, electronic health record documentation, patient education on the different diagnostic and therapeutic options, counseling and coordination of care, risks and benefits of management, compliance, or risk factor reduction   To contact Stroke Continuity provider, please refer to [email or web address] After hours, contact General Neurology

## 2020-11-08 NOTE — Evaluation (Signed)
Clinical/Bedside Swallow Evaluation Patient Details  Name: Eduardo Vasquez MRN: [id number] Date of Birth: May 20, 1929  Today's Date: 11/08/2020 Time: SLP Start Time (ACUTE ONLY): 1210 SLP Stop Time (ACUTE ONLY): 1230 SLP Time Calculation (min) (ACUTE ONLY): 20 min  Past Medical History:  Past Medical History:  Diagnosis Date   Cancer ([place]) 2009   Prostate   Carotid artery occlusion    Dementia    Hypercholesteremia    Hypertension    Stroke Eduardo Vasquez) 2003   Mini - Left Eye- Partial Blindness   Urgency incontinence    Past Surgical History:  Past Surgical History:  Procedure Laterality Date   INGUINAL HERNIA REPAIR Right 12/02/2013   Procedure: HERNIA REPAIR INGUINAL ADULT WITH MESH;  Surgeon: Jamesetta So, MD;  Location: [facility];  Service: General;  Laterality: Right;   INSERTION OF MESH Right 12/02/2013   Procedure: INSERTION OF MESH;  Surgeon: Jamesetta So, MD;  Location: [facility];  Service: General;  Laterality: Right;   HPI:  87 y.o. male who initially presented to the [facility] with acute onset of confusion and right sided weakness after brief LOC at home. He received IV tPA for presumed stroke. Per neuro, exam reveals cognitive impairment with disorientation. There is mild RUE weakness, but no facial droop, visual field cut, ataxia or gross sensory loss. CT negative, MRI pending. He failed his Yale swallow screen which prompted BSE.   Assessment / Plan / Recommendation Clinical Impression  Patient presents with an oropharyngeal swallow that appears largely [facility] and without overt s/s aspiration or penetration. He did demonstrate delays in feeding self and required some assistance with managing cup and spoon. SLP is recommending regular texture solids, thin liquids and plan for SLP to see patient at least one more visit to ensure no difficulty with PO's secondary to his h/o dementia. SLP Visit Diagnosis: Dysphagia, unspecified (R13.10)    Aspiration Risk  No limitations;Mild aspiration  risk    Diet Recommendation Regular;Thin liquid   Liquid Administration via: Cup Medication Administration: Whole meds with liquid Supervision: Patient able to self feed;Full supervision/cueing for compensatory strategies Compensations: Minimize environmental distractions;Slow rate;Small sips/bites Postural Changes: Seated upright at 90 degrees    Other  Recommendations Oral Care Recommendations: Oral care BID;Staff/trained caregiver to provide oral care   Follow up Recommendations None      Frequency and Duration min 1 x/week  1 week       Prognosis    Good    Swallow Study   General Date of Onset: 11/07/20 HPI: 87 y.o. male who initially presented to the [facility] with acute onset of confusion and right sided weakness after brief LOC at home. He received IV tPA for presumed stroke. Per neuro, exam reveals cognitive impairment with disorientation. There is mild RUE weakness, but no facial droop, visual field cut, ataxia or gross sensory loss. CT negative, MRI pending. He failed his Yale swallow screen which prompted BSE. Type of Study: Bedside Swallow Evaluation Previous Swallow Assessment: None found Diet Prior to this Study: NPO Temperature Spikes Noted: No Respiratory Status: Room air History of Recent Intubation: No Behavior/Cognition: Alert;Cooperative;Pleasant mood Oral Cavity Assessment: Within Functional Limits Oral Care Completed by SLP: No Oral Cavity - Dentition: Adequate natural dentition Vision: Functional for self-feeding Self-Feeding Abilities: Able to feed self;Needs set up Patient Positioning: Upright in chair Baseline Vocal Quality: Normal Volitional Cough: Cognitively unable to elicit Volitional Swallow: Able to elicit    Oral/Motor/Sensory Function Overall Oral Motor/Sensory Function: Mild impairment Facial  ROM: Within Functional Limits Facial Symmetry: Within Functional Limits Facial Strength: Within Functional Limits Lingual ROM: Reduced right;Reduced  left;Other (Comment) (some tremoring noted in arms, hands head) Lingual Symmetry: Within Functional Limits Lingual Strength: Reduced Mandible: Within Functional Limits   Ice Chips     Thin Liquid Thin Liquid: Within functional limits Presentation: Cup    Nectar Thick     Honey Thick     Puree Puree: Within functional limits   Solid     Solid: Within functional limits     [name], MA, [facility]

## 2020-11-08 NOTE — Evaluation (Signed)
Occupational Therapy Evaluation Patient Details Name: Eduardo Vasquez MRN: [id number] DOB: [birth year] Today's Date: 11/08/2020    History of Present Illness 87 y.o. male presents to the [facility] ED 11/07/20 by EMS with acute onset mental status change and left side weakness. The patient was determined to be a candidate for IV tPA administration PMHx of prostate cancer, carotid artery occlusion, dementia, hypercholesterolemia, HTN and left eye partial blindness attributed to stroke   Clinical Impression   Pt PTA: Pt living with daughter and cousin assists with ADL tasks. Pt with dementia at baseline, but able to carry on a conversation and perform simple commands. Pt currently, ambulatory in room with hand held assist and performs grooming with set-upA. Pt limited by decreased strength in L side and deficits in balance. Pt's HR increased when perform ADL at sink in standing briefly <5 secs 160 BPM and then returned to 95 BPM. Pt remained asymptomatic. Pt would benefit from continued OT skilled services. OT following acutely. ** family in agreement of provided 24/7 care.      Follow Up Recommendations  Home health OT;Supervision/Assistance - 24 hour    Equipment Recommendations  3 in 1 bedside commode    Recommendations for Other Services       Precautions / Restrictions Precautions Precautions: Fall Restrictions Weight Bearing Restrictions: No      Mobility Bed Mobility               General bed mobility comments: seated in recliner    Transfers Overall transfer level: Needs assistance Equipment used: None Transfers: Sit to/from Stand Sit to Stand: Min guard         General transfer comment: MinA for power up    Balance Overall balance assessment: Needs assistance Sitting-balance support: Feet supported;No upper extremity supported Sitting balance-Leahy Scale: Good     Standing balance support: No upper extremity supported;During functional activity Standing  balance-Leahy Scale: Fair Standing balance comment: able to stand at sink and perform oral care, washing hands and face                           ADL either performed or assessed with clinical judgement   ADL Overall ADL's : Needs assistance/impaired Eating/Feeding: Set up;Sitting   Grooming: Min guard;Standing Grooming Details (indicate cue type and reason): bending over to get water from faucet without difficulty; pt standing x4 mins and then HR increased to 160 BPM and sat down - return to 95 BPM and stood again to spit and rinse when brushing teeth Upper Body Bathing: Set up;Sitting   Lower Body Bathing: Minimal assistance;Cueing for safety   Upper Body Dressing : Set up;Sitting   Lower Body Dressing: Minimal assistance;Cueing for safety;Cueing for sequencing;Sitting/lateral leans;Sit to/from stand   Toilet Transfer: Minimal assistance;Ambulation Toilet Transfer Details (indicate cue type and reason): Pt hand held assist from bed taking a few steps to sink Toileting- Clothing Manipulation and Hygiene: Minimal assistance;Sitting/lateral lean;Sit to/from stand;Cueing for sequencing       Functional mobility during ADLs: Minimal assistance;Rolling walker;Cueing for safety;Cueing for sequencing General ADL Comments: Pt limited by decreased strength in L side and deficits in balance. Pt's HR increased when perform ADL at sink in standing briefly <5 secs 160 BPM and then returned to 95 BPM. Pt remained asymptomatic.     Vision Baseline Vision/History: 1 Wears glasses Ability to See in Adequate Light: 1 Impaired Patient Visual Report: No change from baseline Vision Assessment?: No apparent  visual deficits Additional Comments: partial blindness in L eye; was able to avoid obstacles     Perception     Praxis      Pertinent Vitals/Pain Pain Assessment: Faces Faces Pain Scale: Hurts a little bit Pain Location: right elbow Pain Descriptors / Indicators: Discomfort Pain  Intervention(s): Monitored during session;Repositioned     Hand Dominance Right   Extremity/Trunk Assessment Upper Extremity Assessment Upper Extremity Assessment: Generalized weakness;RUE deficits/detail;LUE deficits/detail RUE Deficits / Details: 4-/5 strength; grip strength 4/5 LUE Deficits / Details: 3+/5 strength shoulder through elbow;  4/5 grip strength   Lower Extremity Assessment Lower Extremity Assessment: RLE deficits/detail;LLE deficits/detail RLE Deficits / Details: ROM WFL, strength grossly 4/5 RLE Sensation: WNL RLE Coordination: decreased fine motor LLE Deficits / Details: ROM WFL, strength grossly 3+/5 LLE Sensation: WNL LLE Coordination: decreased fine motor   Cervical / Trunk Assessment Cervical / Trunk Assessment: Normal   Communication Communication Communication: No difficulties   Cognition Arousal/Alertness: Awake/alert Behavior During Therapy: WFL for tasks assessed/performed Overall Cognitive Status: History of cognitive impairments - at baseline                                 General Comments: dementia at baseline; pt following simple commands and responding to questions appropriately; PLOF data from daughter   General Comments  VSS on RA; max HR 95 BPM to 160 BPM briefly    Exercises     Shoulder Instructions      Home Living Family/patient expects to be discharged to:: Private residence Living Arrangements: Children;Other (Comment) (granddaughter) Available Help at Discharge: Family;Available 24 hours/day Type of Home: House Home Access: Stairs to enter [employer] of Steps: 1   Home Layout: One level     Bathroom Shower/Tub: Teacher, early years/pre: Standard     Home Equipment: Shower seat          Prior Functioning/Environment Level of Independence: Needs assistance  Gait / Transfers Assistance Needed: supervisionA ADL's / Homemaking Assistance Needed: assist with bath in/out of tub  transfer from cousin            OT Problem List: Decreased strength;Decreased activity tolerance;Impaired balance (sitting and/or standing);Decreased safety awareness      OT Treatment/Interventions: Self-care/ADL training;Therapeutic exercise;Therapeutic activities;Cognitive remediation/compensation;Visual/perceptual remediation/compensation;Patient/family education;Balance training;Energy conservation;Neuromuscular education    OT Goals(Current goals can be found in the care plan section) Acute Rehab OT Goals Patient Stated Goal: none stated OT Goal Formulation: With patient Time For Goal Achievement: 11/22/20 Potential to Achieve Goals: Good ADL Goals Pt Will Perform Lower Body Dressing: with min guard assist;sit to/from stand Pt Will Transfer to Toilet: with supervision;ambulating;regular height toilet Additional ADL Goal #1: Pt will perform OOB ADL tasks x10 mins with intermittent seated rest breaks and <2 verbal cues for sequencing.  OT Frequency: Min 2X/week   Barriers to D/C:            Co-evaluation              AM-PAC OT "6 Clicks" Daily Activity     Outcome Measure Help from another person eating meals?: A Little Help from another person taking care of personal grooming?: A Little Help from another person toileting, which includes using toliet, bedpan, or urinal?: A Little Help from another person bathing (including washing, rinsing, drying)?: A Little Help from another person to put on and taking off regular upper body clothing?: A Little Help from  another person to put on and taking off regular lower body clothing?: A Little 6 Click Score: 18   End of Session Equipment Utilized During Treatment: Gait belt Nurse Communication: Mobility status  Activity Tolerance: Patient tolerated treatment well Patient left: Other (comment) (up with PT in room)  OT Visit Diagnosis: Unsteadiness on feet (R26.81);Muscle weakness (generalized) (M62.81)                 Time: 1100-1130 OT Time Calculation (min): 30 min Charges:  OT General Charges $OT Visit: 1 Visit OT Evaluation $OT Eval Moderate Complexity: 1 Mod OT Treatments $Self Care/Home Management : 8-22 mins  Jefferey Pica, OTR/L Acute Rehabilitation Services Pager: [phone] Office: [place] 11/08/2020, 3:06 PM

## 2020-11-08 NOTE — Evaluation (Signed)
Physical Therapy Evaluation Patient Details Name: Eduardo Vasquez MRN: [id number] DOB: 12/28/1929 Today's Date: 11/08/2020   History of Present Illness  87 y.o. male presents to [facility] 11/07/20 by EMS with acute onset mental status change and left side weakness. 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 Pt's daughter provided information on PLOF and home set up. Pt supervision with short distance ambulation, cousin supervises bathing, and daughter provides iADLs. Pt is currently limited in safe mobility by decreased strength and balance in presence of underlying dementia. Pt is min guard for transfers and contact guard assist for short distance ambulation without AD. Pt family reports he is better than when he first arrived, however is not back to her baseline. PT recommends HHPT at discharge to work on strength and balance. PT will continue to follow acutely.     Follow Up Recommendations Home health PT;Supervision/Assistance - 24 hour    Equipment Recommendations  None recommended by PT    Recommendations for Other Services OT consult     Precautions / Restrictions Precautions Precautions: Fall Restrictions Weight Bearing Restrictions: No      Mobility  Bed Mobility               General bed mobility comments: at sink with OT on entry    Transfers Overall transfer level: Needs assistance Equipment used: None Transfers: Sit to/from Stand Sit to Stand: Min guard         General transfer comment: min guard for safety, increased time and effort to steady, but able to complete without outside assist  Ambulation/Gait Ambulation/Gait assistance: Min assist Gait Distance (Feet):  50 Feet Assistive device: None Gait Pattern/deviations: Step-through pattern;Decreased step length - right;Decreased step length - left;Trunk flexed Gait velocity: slowed Gait velocity interpretation: <1.31 ft/sec, indicative of household ambulator General Gait Details: light contact guard assist for safety, slowed, mildly unsteady gait, no overt LoB, ocassionally reaching for railing in hallway.     Modified Rankin (Stroke Patients Only) Modified Rankin (Stroke Patients Only) Pre-Morbid Rankin Score: No symptoms Modified Rankin: No significant disability     Balance Overall balance assessment: Needs assistance Sitting-balance support: Feet supported;No upper extremity supported Sitting balance-Leahy Scale: Good     Standing balance support: No upper extremity supported;During functional activity Standing balance-Leahy Scale: Fair Standing balance comment: able to stand at sink and perform oral care                             Pertinent Vitals/Pain Pain Assessment: No/denies pain    Home Living Family/patient expects to be discharged to:: Private residence Living Arrangements: Children;Other (Comment) (granddaughter) Available Help at Discharge: Family;Available 24 hours/day Type of Home: House Home Access: Stairs to enter   [employer] of Steps: 1 Home Layout: One level Home Equipment: Shower seat      Prior Function Level of Independence: Needs assistance   Gait / Transfers Assistance Needed: supervisionA  ADL's / Homemaking Assistance Needed: assist with bath in/out of tub transfer from cousin        Hand Dominance   Dominant Hand: Right    Extremity/Trunk Assessment   Upper Extremity Assessment Upper Extremity Assessment: Defer  to OT evaluation RUE Deficits / Details: 4-/5 strength; grip strength 4/5 LUE Deficits / Details: 3+/5 strength    Lower Extremity Assessment Lower Extremity Assessment: RLE deficits/detail;LLE  deficits/detail RLE Deficits / Details: ROM WFL, strength grossly 4/5 RLE Sensation: WNL RLE Coordination: decreased fine motor LLE Deficits / Details: ROM WFL, strength grossly 3+/5 LLE Sensation: WNL LLE Coordination: decreased fine motor       Communication   Communication: No difficulties  Cognition Arousal/Alertness: Awake/alert Behavior During Therapy: WFL for tasks assessed/performed Overall Cognitive Status: History of cognitive impairments - at baseline                                 General Comments: dementia at baseline      General Comments General comments (skin integrity, edema, etc.): VSS on RA max noted HR with ambulation 112 bpm        Assessment/Plan    PT Assessment Patient needs continued PT services  PT Problem List Decreased strength;Decreased balance;Decreased activity tolerance;Decreased cognition;Decreased knowledge of use of DME;Decreased safety awareness       PT Treatment Interventions DME instruction;Gait training;Stair training;Functional mobility training;Therapeutic activities;Therapeutic exercise;Balance training;Cognitive remediation;Patient/family education    PT Goals (Current goals can be found in the Care Plan section)  Acute Rehab PT Goals Patient Stated Goal: none stated PT Goal Formulation: With patient/family Time For Goal Achievement: 11/22/20 Potential to Achieve Goals: Fair    Frequency Min 4X/week    AM-PAC PT "6 Clicks" Mobility  Outcome Measure Help needed turning from your back to your side while in a flat bed without using bedrails?: None Help needed moving from lying on your back to sitting on the side of a flat bed without using bedrails?: None Help needed moving to and from a bed to a chair (including a wheelchair)?: None Help needed standing up from a chair using your arms (e.g., wheelchair or bedside chair)?: None Help needed to walk in hospital room?: A Little Help needed climbing 3-5 steps with  a railing? : A Little 6 Click Score: 22    End of Session Equipment Utilized During Treatment: Gait belt Activity Tolerance: Patient tolerated treatment well Patient left: in chair;with call bell/phone within reach;with chair alarm set;with family/visitor present;Other (comment) (MD in room) Nurse Communication: Mobility status PT Visit Diagnosis: Unsteadiness on feet (R26.81);Other abnormalities of gait and mobility (R26.89);History of falling (Z91.81);Muscle weakness (generalized) (M62.81)    Time: [id number] PT Time Calculation (min) (ACUTE ONLY): 23 min   Charges:   PT Evaluation $PT Eval Moderate Complexity: 1 Mod PT Treatments $Gait Training: 8-22 mins        Kayte Borchard B. Migdalia Dk PT, DPT Acute Rehabilitation Services Pager [phone] Office [phone]   [place] 11/08/2020, 12:16 PM

## 2020-11-09 ENCOUNTER — Inpatient Hospital Stay (HOSPITAL_COMMUNITY): Payer: Medicare Other

## 2020-11-09 DIAGNOSIS — I63511 Cerebral infarction due to unspecified occlusion or stenosis of right middle cerebral artery: Secondary | ICD-10-CM

## 2020-11-09 DIAGNOSIS — I639 Cerebral infarction, unspecified: Secondary | ICD-10-CM

## 2020-11-09 LAB — ECHOCARDIOGRAM COMPLETE
Area-P 1/2: 3.23 cm2
Height: 72 in
Weight: 2704 oz

## 2020-11-09 LAB — GLUCOSE, CAPILLARY
Glucose-Capillary: 96 mg/dL (ref 70–99)
Glucose-Capillary: 91 mg/dL (ref 70–99)
Glucose-Capillary: 124 mg/dL — ABNORMAL HIGH (ref 70–99)
Glucose-Capillary: 111 mg/dL — ABNORMAL HIGH (ref 70–99)

## 2020-11-09 MED ORDER — CLOPIDOGREL BISULFATE 75 MG PO TABS
75.0000 mg | ORAL_TABLET | Freq: Every day | ORAL | Status: DC
  Administered 2020-11-09: 75 mg via ORAL
  Filled 2020-11-09: qty 1

## 2020-11-09 MED ORDER — ATORVASTATIN CALCIUM 40 MG PO TABS
40.0000 mg | ORAL_TABLET | Freq: Every day | ORAL | 3 refills | Status: DC
Start: 2020-11-10 — End: 2020-11-20

## 2020-11-09 MED ORDER — CLOPIDOGREL BISULFATE 75 MG PO TABS: 75.0000 mg | ORAL_TABLET | Freq: Every day | ORAL | 3 refills | Status: DC

## 2020-11-09 NOTE — Discharge Summary (Addendum)
Stroke Discharge Summary  Patient ID: Eduardo Vasquez       MRN: [id number]      DOB: 20-Mar-1929  Date of Admission: 11/07/2020 Date of Discharge: 11/09/2020  Attending Physician:  Dr. Leonie Man  Consultant(s):   None  Patient's PCP:  Rosita Fire, MD  DISCHARGE DIAGNOSIS: Principal Problem: Large right basal infarct of cryptogenic etiology Active Problems:   Stroke determined by clinical assessment [facility]) Baseline dementia Suspect amyloid angiopathy   Allergies as of 11/09/2020   No Known Allergies      Medication List     STOP taking these medications    aspirin EC 81 MG tablet   pravastatin 80 MG tablet Commonly known as: PRAVACHOL       TAKE these medications    amLODipine 10 MG tablet Commonly known as: NORVASC Take 10 mg by mouth daily.   atorvastatin 40 MG tablet Commonly known as: LIPITOR Take 1 tablet (40 mg total) by mouth daily. Start taking on: November 10, 2020   clopidogrel 75 MG tablet Commonly known as: PLAVIX Take 1 tablet (75 mg total) by mouth daily. Start taking on: November 10, 2020   donepezil 10 MG tablet Commonly known as: ARICEPT TAKE 1 TABLET(10 MG) BY MOUTH AT BEDTIME What changed: See the new instructions.   doxazosin 4 MG tablet Commonly known as: CARDURA Take 4 mg by mouth daily.   levothyroxine 75 MCG tablet Commonly known as: SYNTHROID Take 75 mcg by mouth daily.   Lumigan 0.01 % Soln Generic drug: bimatoprost Place 1 drop into both eyes daily as needed (dry eyes). As needed   memantine 10 MG tablet Commonly known as: NAMENDA Take 1 tablet (10 mg total) by mouth 2 (two) times daily.   multivitamin tablet Take 1 tablet by mouth daily.   tamsulosin 0.4 MG Caps capsule Commonly known as: FLOMAX Take 0.4 mg by mouth daily.       History of Present Illness  Eduardo Vasquez is an 87 y.o. male with a PMHx of prostate cancer, carotid artery occlusion, dementia, hypercholesterolemia, HTN and left eye partial  blindness attributed to stroke, who [facility] on Saturday afternoon with acute onset of AMS and left sided weakness. LKN was 12:45 PM. The patient was at home with his daughter sitting on the front porch. The daughter went inside to get him some water and when she came out he was laying on the ground, less responsive. The transition from seated position to the ground was not wtnessed but was suspected to be a fall. No known seizure activity.  He was drowsy with left sided weakness and slurred speech on EMS arrival to his home. There was some improvement per EMS en route.    Teleneurology consult was obtained. NIHSS 6. The patient was determined to be a candidate for IV tPA administration, with bolus administered at 1415. .     Dr. Johny Chess Teleneurology note was reviewed: "89/m with PMH of dementia, HTN, HLD, ?stroke many years ago with no residual deficits, mRS 3, LKW at 21 today at home when family noted sudden onset of him being slumped o the chair on the porch. Lives with her daughter, she said that last seen normal was 1245 when she saw him sitting on the porch, he asked for water which she had given to him 15 to 20 minutes ago and then she looked outside and found him slumped on the chair.  She called his name, went and saw him,  no response, then he slowly came around but still could not recognize her and within a few minutes it did open his eyes and said her name.  He said a few words but he was not making complete sense. 911 was called.  No history of seizures.  No reported witnessed seizure. Code stroke was not activated by EMS and was activated in the ER."   LSN: [id number] tPA Given: Yes mRS: 3  Pertinent Imaging  11/07/20 CT Head WO Contrast  1. No evidence of acute intracranial abnormality.  ASPECTS of 10. 2. Severe chronic small vessel ischemic disease.  11/08/20 MRI Brain WO Contrast  1. Small acute right basal ganglia/corona radiata infarct. 2. Severe chronic small vessel  ischemic disease. 3. Evidence of previous subarachnoid hemorrhage and underlying chronic parenchymal microhemorrhages in the posterior right cerebral hemisphere, query cerebral amyloid angiopathy.  11/09/20 Echo Complete   1. Left ventricular ejection fraction, by estimation, is 55 to 60%. The  left ventricle has normal function. The left ventricle has no regional  wall motion abnormalities. There is mild focal basal septal left  ventricular hypertrophy. Left ventricular  diastolic parameters are indeterminate.   2. Right ventricular systolic function is normal. The right ventricular  size is normal. Tricuspid regurgitation signal is inadequate for assessing  PA pressure.   3. The mitral valve is normal in structure. No evidence of mitral valve  regurgitation. No evidence of mitral stenosis.   4. The aortic valve is tricuspid. Aortic valve regurgitation is trivial.  Mild aortic valve sclerosis is present, with no evidence of aortic valve  stenosis.   5. The inferior vena cava is normal in size with greater than 50%  respiratory variability, suggesting right atrial pressure of 3 mmHg.   6. Technically difficult study with poor acoustic windows. 11/09/20  CUS Bilateral  Right Carotid: Velocities in the right ICA are consistent with a 1-39%  stenosis.   Left Carotid: Velocities in the left ICA are consistent with a 1-39% stenosis, however velocities may be underestimated due to calcific shadowing.   Vertebrals:  Bilateral vertebral arteries demonstrate antegrade flow.  Subclavians: Normal flow hemodynamics were seen in bilateral subclavian arteries.   11/09/20 TCD  Highly suboptimal study with poor windows throughout. antegrade flow  noted in both opthalmic arteries    Discharge Examination  Constitutional: NAD  Respiratory: Unlabored respirations  Cardiac: RRR   MS: Oriented to self and hospital only, follows simple commands  Speech: Fluent, difficulty with naming, repetition  intact  Cranial nerves: EOMI, Visual fields appear full, Face symmetric, Tongue midline  Motor: Normal bulk and tone. No drift. Diminished fine finer movements to the left.  Sensory: Intact to light touch throughout  Coordination: Deferred  Gait: Deferred given confusion   Assessment and Plan  Eduardo Vasquez is an 87 y.o. 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 He received IVTPA and transferred to [facility] for further work up and neurological monitoring.   #Right Basal Ganglia Stroke sp IVTPA, Etiology cryptogenic likely cardioembolic  He presents with the symptoms described above. Stroke work up is complete at this time. MRI Brain showed an acute right basal ganglia/corona radiata stroke + evidence of prior SAH and underlying chronic microhemorrhages in the posterior right cerebral hemisphere. Vessel imaging was done with CUS and TCD. Bilateral CUS  showed 1-39 % stenosis in the right and left ICA. TCD study was suboptimal. Echo w/EF 55 to 60 % and LA normal in size. Stroke labs w/hemoglobin a1c 6.4, LDL 82. Stroke etiology is cryptogenic, likely cardioembolic. Further cardiac work up with cardiac event monitor deferred given he is not a good candidate for anticoagulation due to dementia and microhemorrhages noted on MRI Brain. At discharge will continue Plavix 75 mg QD and Atorvastatin 40 mg QD for stroke prevention- was taking Aspirin at home thus was switched to Plavix this admission. Follow up in stroke clinic at discharge. PT/OT saw him this admission and recommended home health PT/OT.  #HTN  He has a history of hypertension. SBP trending in the 150-180 range. Home amlodipine 10 mg resumed this hospitalization. He is to follow up with PCP for blood pressure management at discharge.     #Stroke Dysphagia Screening Passed bedside swallow evaluation for a regular heart healthy diet   #Stroke Diabetes Screening Hemoglobin A1c 6.4, prediabetic range. SSI while inpatient, PCP to manage prediabetes outpatient.   #DVT Prophylaxis  Lovenox 30 mg SQ QD for DVT Prophylaxis while hospitalized. Stop at discharge.   Ruta Hinds, NP Stroke Service Nurse Practitioner Patient seen and discussed with attending physician Dr. Leonie Man  31mnutes were spent preparing discharge.  I have personally obtained history,examined this patient, reviewed notes, independently viewed imaging studies, participated in medical decision making and plan of care.ROS completed by me personally and pertinent positives fully documented  I have made any additions or clarifications directly to the above note. Agree with note above.  Patient has baseline dementia and presented with a large basal ganglia infarct likely of cryptogenic etiology with strong suspicion for underlying paroxysmal A. fib but MRI scan shows moderate subarachnoid hemorrhage and cerebral microhemorrhages along with his baseline dementia raising strong suspicion for amyloid angiopathy hence patient is not a good long-term anticoagulation candidate and still not to look recorder insertion.  May consider 30-day outpatient cardiac monitoring after discharge.  PAntony Contras MD Medical Director MMaryvillePager: [phone]/29/2022 5:36 PM

## 2020-11-09 NOTE — TOC Transition Note (Signed)
Transition of Care [facility]) - CM/SW Discharge Note   Patient Details  Name: Eduardo Vasquez MRN: [id number] Date of Birth: 11-04-1929  [facility] CM/SW Contact:  Tom-Johnson, Renea Ee, RN Phone Number: 11/09/2020, 4:14 PM   Clinical Narrative:    CM consulted Home Health TOC needs at discharge. Spoke with patient and daughter, Eduardo Vasquez at bedside. Patient states he lives with daughter who helps him with his care at home. Daughter transports to and from appointments. Patient does not have DME's at home. Does not need any financial assistance with obtaining medications. List of Home health services from Medicare.gov given to patient and daughter. Patient gave CM permission to get services from [facility]. Cory at [place] [phone]) called and accepted referral for PT/OT/Aide services to patient. No further TOC needs at this time.   Final next level of care: [place] Barriers to Discharge: No Barriers Identified   Patient Goals and CMS Choice Patient states their goals for this hospitalization and ongoing recovery are:: To go home with daughter CMS Medicare.gov Compare Post Acute Care list provided to:: Patient Choice offered to / list presented to : Patient, Adult Children (Daughter)  Discharge Placement                       Discharge Plan and Services                          HH Arranged: PT, OT, Nurse's Aide Ward Agency: [place] Date [facility] Agency Contacted: 11/09/20 Time [place]: [phone] Representative spoke with at [place]: [place] ([place]) Interventions     Readmission Risk Interventions No flowsheet data found.

## 2020-11-09 NOTE — Progress Notes (Signed)
SLP Cancellation Note  Patient Details Name: Eduardo Vasquez MRN: [id number] DOB: [birth year]   Cancelled treatment:       Reason Eval/Treat Not Completed: Patient at procedure or test/unavailable (pt currently with neurology. SLP will follow up.)  Sherise Geerdes I. Hardin Negus, [place], [place] Office number [phone] Pager [phone]  [place] Marshall 11/09/2020, 11:20 AM

## 2020-11-09 NOTE — Progress Notes (Signed)
Eduardo Vasquez to be D/C'd Home per MD order.  Discussed with the patient and all questions fully answered.  VSS, Skin clean, dry and intact without evidence of skin break down, no evidence of skin tears noted. IV catheter discontinued intact. Site without signs and symptoms of complications. Dressing and pressure applied.  An After Visit Summary was printed and given to the patient. Patient received prescription.  D/c education completed with patient/family including follow up instructions, medication list, d/c activities limitations if indicated, with other d/c instructions as indicated by MD - patient able to verbalize understanding, all questions fully answered.   Patient instructed to return to ED, call 911, or call MD for any changes in condition.   Patient escorted via [place], and D/C home via private auto.   [place] 11/09/2020 4:48 PM

## 2020-11-09 NOTE — Discharge Instructions (Signed)
[place],  You have been hospitalized due to a stroke that occurred to the right side of your brain and affected the left side of your body.   We are unsure why your stroke happened. To reduce your risk of having another stroke please take Plavix 75 mg daily and Atorvastatin 40 mg daily.   Follow up with [place] in stroke clinic. Expect a call regarding this appointment. Follow up with your PCP- you are to schedule this appointment.   It has been a pleasure caring for you. Your medical team wishes you the best.   Sincerely,   The Stroke Team

## 2020-11-09 NOTE — Progress Notes (Signed)
  Echocardiogram 2D Echocardiogram has been performed.  Darlina Sicilian M 11/09/2020, 11:00 AM

## 2020-11-09 NOTE — Progress Notes (Signed)
Carotid artery duplex and TCD completed. Refer to "CV Proc" under chart review to view preliminary results.  11/09/2020 12:11 PM Kelby Aline., MHA, RVT, RDCS, RDMS

## 2020-11-11 ENCOUNTER — Encounter (HOSPITAL_COMMUNITY): Payer: Self-pay | Admitting: Emergency Medicine

## 2020-11-11 ENCOUNTER — Other Ambulatory Visit: Payer: Self-pay

## 2020-11-11 ENCOUNTER — Emergency Department (HOSPITAL_COMMUNITY): Payer: Medicare Other

## 2020-11-11 ENCOUNTER — Emergency Department (HOSPITAL_COMMUNITY)
Admission: EM | Admit: 2020-11-11 | Discharge: 2020-11-12 | Disposition: A | Discharge status: Home / Self Care | Payer: Medicare Other | Attending: Emergency Medicine | Admitting: Emergency Medicine

## 2020-11-11 DIAGNOSIS — R531 Weakness: Secondary | ICD-10-CM | POA: Insufficient documentation

## 2020-11-11 DIAGNOSIS — I10 Essential (primary) hypertension: Secondary | ICD-10-CM | POA: Diagnosis not present

## 2020-11-11 DIAGNOSIS — Z8673 Personal history of transient ischemic attack (TIA), and cerebral infarction without residual deficits: Secondary | ICD-10-CM

## 2020-11-11 DIAGNOSIS — R29818 Other symptoms and signs involving the nervous system: Secondary | ICD-10-CM | POA: Diagnosis not present

## 2020-11-11 DIAGNOSIS — Z79899 Other long term (current) drug therapy: Secondary | ICD-10-CM | POA: Insufficient documentation

## 2020-11-11 DIAGNOSIS — H5462 Unqualified visual loss, left eye, normal vision right eye: Secondary | ICD-10-CM | POA: Diagnosis not present

## 2020-11-11 DIAGNOSIS — F0390 Unspecified dementia without behavioral disturbance: Secondary | ICD-10-CM | POA: Insufficient documentation

## 2020-11-11 DIAGNOSIS — I6782 Cerebral ischemia: Secondary | ICD-10-CM | POA: Diagnosis not present

## 2020-11-11 DIAGNOSIS — R4182 Altered mental status, unspecified: Secondary | ICD-10-CM | POA: Diagnosis not present

## 2020-11-11 DIAGNOSIS — Z8546 Personal history of malignant neoplasm of prostate: Secondary | ICD-10-CM | POA: Diagnosis not present

## 2020-11-11 DIAGNOSIS — Z7902 Long term (current) use of antithrombotics/antiplatelets: Secondary | ICD-10-CM | POA: Diagnosis not present

## 2020-11-11 DIAGNOSIS — I69398 Other sequelae of cerebral infarction: Secondary | ICD-10-CM | POA: Diagnosis not present

## 2020-11-11 DIAGNOSIS — I69354 Hemiplegia and hemiparesis following cerebral infarction affecting left non-dominant side: Secondary | ICD-10-CM | POA: Diagnosis not present

## 2020-11-11 DIAGNOSIS — G319 Degenerative disease of nervous system, unspecified: Secondary | ICD-10-CM | POA: Diagnosis not present

## 2020-11-11 LAB — CBC WITH DIFFERENTIAL/PLATELET
Abs Immature Granulocytes: 0.03 10*3/uL (ref 0.00–0.07)
Basophils Absolute: 0.1 10*3/uL (ref 0.0–0.1)
Basophils Relative: 1 %
Eosinophils Absolute: 0.2 10*3/uL (ref 0.0–0.5)
Eosinophils Relative: 3 %
HCT: 35.7 % — ABNORMAL LOW (ref 39.0–52.0)
Hemoglobin: 11.6 g/dL — ABNORMAL LOW (ref 13.0–17.0)
Immature Granulocytes: 0 %
Lymphocytes Relative: 23 %
Lymphs Abs: 1.6 10*3/uL (ref 0.7–4.0)
MCH: 27.8 pg (ref 26.0–34.0)
MCHC: 32.5 g/dL (ref 30.0–36.0)
MCV: 85.4 fL (ref 80.0–100.0)
Monocytes Absolute: 0.9 10*3/uL (ref 0.1–1.0)
Monocytes Relative: 12 %
Neutro Abs: 4.2 10*3/uL (ref 1.7–7.7)
Neutrophils Relative %: 61 %
Platelets: 192 10*3/uL (ref 150–400)
RBC: 4.18 MIL/uL — ABNORMAL LOW (ref 4.22–5.81)
RDW: 16.5 % — ABNORMAL HIGH (ref 11.5–15.5)
WBC: 7 10*3/uL (ref 4.0–10.5)
nRBC: 0 % (ref 0.0–0.2)

## 2020-11-11 LAB — COMPREHENSIVE METABOLIC PANEL
ALT: 18 U/L (ref 0–44)
AST: 27 U/L (ref 15–41)
Albumin: 3.3 g/dL — ABNORMAL LOW (ref 3.5–5.0)
Alkaline Phosphatase: 68 U/L (ref 38–126)
Anion gap: 7 (ref 5–15)
BUN: 25 mg/dL — ABNORMAL HIGH (ref 8–23)
CO2: 23 mmol/L (ref 22–32)
Calcium: 8.5 mg/dL — ABNORMAL LOW (ref 8.9–10.3)
Chloride: 108 mmol/L (ref 98–111)
Creatinine, Ser: 1.71 mg/dL — ABNORMAL HIGH (ref 0.61–1.24)
GFR, Estimated: 38 mL/min — ABNORMAL LOW (ref >60)
Glucose, Bld: 112 mg/dL — ABNORMAL HIGH (ref 70–99)
Potassium: 3.6 mmol/L (ref 3.5–5.1)
Sodium: 138 mmol/L (ref 135–145)
Total Bilirubin: 0.4 mg/dL (ref 0.3–1.2)
Total Protein: 7.1 g/dL (ref 6.5–8.1)

## 2020-11-11 NOTE — ED Triage Notes (Signed)
Per family pts mobility has gotten worse since 1700 tonight. They states his gait is shuffling and sporadic.

## 2020-11-12 DIAGNOSIS — G8194 Hemiplegia, unspecified affecting left nondominant side: Secondary | ICD-10-CM | POA: Diagnosis not present

## 2020-11-12 DIAGNOSIS — I6523 Occlusion and stenosis of bilateral carotid arteries: Secondary | ICD-10-CM | POA: Diagnosis not present

## 2020-11-12 DIAGNOSIS — I639 Cerebral infarction, unspecified: Secondary | ICD-10-CM | POA: Diagnosis not present

## 2020-11-12 DIAGNOSIS — I10 Essential (primary) hypertension: Secondary | ICD-10-CM | POA: Diagnosis not present

## 2020-11-12 LAB — URINALYSIS, ROUTINE W REFLEX MICROSCOPIC
Bilirubin Urine: NEGATIVE
Glucose, UA: NEGATIVE mg/dL
Hgb urine dipstick: NEGATIVE
Ketones, ur: 5 mg/dL — AB
Leukocytes,Ua: NEGATIVE
Nitrite: NEGATIVE
Protein, ur: NEGATIVE mg/dL
Specific Gravity, Urine: 1.021 (ref 1.005–1.030)
pH: 5 (ref 5.0–8.0)

## 2020-11-12 NOTE — Discharge Instructions (Addendum)
Follow-up with your primary doctor this morning as previously scheduled.  Continue medications as previously prescribed.  Return to the emergency department if you experience any new and/or concerning symptoms.

## 2020-11-12 NOTE — ED Provider Notes (Signed)
[facility] EMERGENCY DEPARTMENT Provider Note   CSN: [id number] Arrival date & time: 11/11/20  2202     History Chief Complaint  Patient presents with   Weakness    Eduardo Vasquez is a 87 y.o. male. Level 5 caveat due to dementia.  Most of history comes from nephew  Weakness Patient presents with mental status change.  Recent admission to hospital for stroke.  Just recently discharged couple days ago.  States around 5:00 tonight had decreased mental status.  Instead of his small slow gait he now had a shuffling gait.  States that patient also has a blank look on his face that is not there.  Slower to answer and cannot answer all the questions normally be able to.  No trauma.    Past Medical History:  Diagnosis Date   Cancer [facility]) 2009   Prostate   Carotid artery occlusion    Dementia ([place])    Hypercholesteremia    Hypertension    Stroke [facility]) 2003   Mini - Left Eye- Partial Blindness   Urgency incontinence     Patient Active Problem List   Diagnosis Date Noted   Stroke determined by clinical assessment ([place]) 11/07/2020   Acute right MCA stroke ([place]) 11/07/2020   Occlusion and stenosis of carotid artery without mention of cerebral infarction 11/03/2011    Past Surgical History:  Procedure Laterality Date   INGUINAL HERNIA REPAIR Right 12/02/2013   Procedure: HERNIA REPAIR INGUINAL ADULT WITH MESH;  Surgeon: Jamesetta So, MD;  Location: [facility];  Service: General;  Laterality: Right;   INSERTION OF MESH Right 12/02/2013   Procedure: INSERTION OF MESH;  Surgeon: Jamesetta So, MD;  Location: [facility];  Service: General;  Laterality: Right;       Family History  Problem Relation Age of Onset   Hypertension Mother    Hyperlipidemia Mother    Hypertension Father    Hyperlipidemia Father    Diabetes Daughter    Prostate cancer Brother    Hypertension Brother     Social History   Tobacco Use   Smoking status: Never   Smokeless tobacco: Never  Substance Use  Topics   Alcohol use: No    Alcohol/week: 0.0 standard drinks    Comment: 27 years alcohol free in AA   Drug use: No    Home Medications Prior to Admission medications   Medication Sig Start Date End Date Taking? Authorizing Provider  amLODipine (NORVASC) 10 MG tablet Take 10 mg by mouth daily. 10/12/15   [provider]  atorvastatin (LIPITOR) 40 MG tablet Take 1 tablet (40 mg total) by mouth daily. 11/10/20   Einar Pheasant, NP  clopidogrel (PLAVIX) 75 MG tablet Take 1 tablet (75 mg total) by mouth daily. 11/10/20   Einar Pheasant, NP  donepezil (ARICEPT) 10 MG tablet TAKE 1 TABLET(10 MG) BY MOUTH AT BEDTIME Patient taking differently: Take 10 mg by mouth at bedtime. 02/27/17   Penumalli, Earlean Polka, MD  doxazosin (CARDURA) 4 MG tablet Take 4 mg by mouth daily.    [provider]  levothyroxine (SYNTHROID) 75 MCG tablet Take 75 mcg by mouth daily. 10/22/20   [provider]  LUMIGAN 0.01 % SOLN Place 1 drop into both eyes daily as needed (dry eyes). As needed 12/17/15   [provider]  memantine (NAMENDA) 10 MG tablet Take 1 tablet (10 mg total) by mouth 2 (two) times daily. 01/26/16   Penumalli, Earlean Polka, MD  Multiple Vitamin (MULTIVITAMIN) tablet Take 1 tablet by mouth daily.    [provider]  Tamsulosin HCl (FLOMAX) 0.4 MG CAPS Take 0.4 mg by mouth daily.    [provider]    Allergies    Patient has no known allergies.  Review of Systems   Review of Systems  Unable to perform ROS: Dementia  Neurological:  Positive for weakness.   Physical Exam Updated Vital Signs BP (!) 169/86   Pulse (!) 59   Temp 98.3 F (36.8 C)   Resp 13   Ht 6' (1.829 m)   Wt 76.7 kg   SpO2 100%   BMI 22.93 kg/m   Physical Exam Vitals and nursing note reviewed.  HENT:     Head: Atraumatic.  Eyes:     General: No scleral icterus.    Extraocular Movements: Extraocular movements intact.  Cardiovascular:     Rate and Rhythm: Regular  rhythm.  Pulmonary:     Breath sounds: No wheezing.  Abdominal:     Tenderness: There is no abdominal tenderness.  Musculoskeletal:        General: Normal range of motion.     Cervical back: Neck supple.  Skin:    General: Skin is warm.     Capillary Refill: Capillary refill takes less than 2 seconds.  Neurological:     Mental Status: He is alert.     Comments: will answer some questions but slow to answer.  Able to recognize family member.  Blank look on face.  Decreased responsiveness from his baseline level.  Difficult to get to move extremities since not following most commands.  Appears to move bilateral extremities however.    ED Results / Procedures / Treatments   Labs (all labs ordered are listed, but only abnormal results are displayed) Labs Reviewed  COMPREHENSIVE METABOLIC PANEL - Abnormal; Notable for the following components:      Result Value   Glucose, Bld 112 (*)    BUN 25 (*)    Creatinine, Ser 1.71 (*)    Calcium 8.5 (*)    Albumin 3.3 (*)    GFR, Estimated 38 (*)    All other components within normal limits  CBC WITH DIFFERENTIAL/PLATELET - Abnormal; Notable for the following components:   RBC 4.18 (*)    Hemoglobin 11.6 (*)    HCT 35.7 (*)    RDW 16.5 (*)    All other components within normal limits  URINALYSIS, ROUTINE W REFLEX MICROSCOPIC    EKG EKG Interpretation  Date/Time:  Wednesday November 11 2020 23:07:42 EDT Ventricular Rate:  64 PR Interval:  91 QRS Duration: 149 QT Interval:  448 QTC Calculation: 463 R Axis:   -60 Text Interpretation: Sinus rhythm Atrial premature complex RBBB and LAFB No significant change since 11/07/2020 Confirmed by Veryl Speak [phone]) on 11/11/2020 11:22:49 PM  Radiology CT HEAD WO CONTRAST (5MM)  Result Date: 11/11/2020 CLINICAL DATA:  Neurologic deficit. EXAM: CT HEAD WITHOUT CONTRAST TECHNIQUE: Contiguous axial images were obtained from the base of the skull through the vertex without intravenous contrast.  COMPARISON:  Head CT dated 11/07/2020. FINDINGS: Brain: Moderate age-related atrophy and chronic microvascular ischemic changes. There is no acute intracranial hemorrhage. No mass effect or midline shift no extra-axial fluid collection Vascular: No hyperdense vessel or unexpected calcification. Skull: Normal. Negative for fracture or focal lesion. Sinuses/Orbits: No acute finding. Other: None IMPRESSION: 1. No acute intracranial pathology. 2. Moderate age-related atrophy and chronic microvascular ischemic changes. Electronically Signed  By: Anner Crete M.D.   On: 11/11/2020 22:41    Procedures Procedures   Medications Ordered in ED Medications - No data to display  ED Course  I have reviewed the triage vital signs and the nursing notes.  Pertinent labs & imaging results that were available during my care of the patient were reviewed by me and considered in my medical decision making (see chart for details).    MDM Rules/Calculators/A&P                           Patient with recent stroke.  Now decreased mental status and shuffling gait instead of normal gait.  Head CT reassuring.  Lab work reassuring but urinalysis pending.  Care turned over to Dr. Stark Jock Final Clinical Impression(s) / ED Diagnoses Final diagnoses:  None    Rx / DC Orders ED Discharge Orders     None        Davonna Belling, MD 11/12/20 0010

## 2020-11-12 NOTE — ED Provider Notes (Signed)
  Physical Exam  BP (!) 164/79   Pulse 62   Temp 98.3 F (36.8 C)   Resp 15   Ht 6' (1.829 m)   Wt 76.7 kg   SpO2 99%   BMI 22.93 kg/m   Physical Exam Vitals and nursing note reviewed.  Constitutional:      General: He is not in acute distress.    Appearance: He is well-developed. He is not diaphoretic.  HENT:     Head: Normocephalic and atraumatic.  Eyes:     Extraocular Movements: Extraocular movements intact.     Pupils: Pupils are equal, round, and reactive to light.  Cardiovascular:     Rate and Rhythm: Normal rate and regular rhythm.     Heart sounds: No murmur heard.   No friction rub.  Pulmonary:     Effort: Pulmonary effort is normal. No respiratory distress.     Breath sounds: Normal breath sounds. No wheezing or rales.  Abdominal:     General: Bowel sounds are normal. There is no distension.     Palpations: Abdomen is soft.     Tenderness: There is no abdominal tenderness.  Musculoskeletal:        General: Normal range of motion.     Cervical back: Normal range of motion and neck supple.  Skin:    General: Skin is warm and dry.  Neurological:     General: No focal deficit present.     Mental Status: He is alert and oriented to person, place, and time.     Cranial Nerves: No cranial nerve deficit.     Motor: No weakness.     Coordination: Coordination normal.     Comments: Patient with normal strength throughout.  He does have some tremor and is generally slow to respond, however this seems to be his baseline according to family member at bedside    ED Course/Procedures     Procedures  MDM  Care assumed from Dr. Alvino Chapel at shift change.  Patient awaiting results of a urinalysis and laboratory studies.  These have returned and are unremarkable.  Patient with recent admission at [facility] for acute stroke.  He has been home for several days and undergoing physical therapy.  Today after his therapy session, the nephew at bedside tells me he developed a  stare and decreased responsiveness.  He was not speaking and was slow to respond to them.  They were concerned about the possibility of another stroke.  CT scan of the head was unremarkable.  While in the emergency department, patient has returned to his baseline mental status.  He ambulated to the bathroom to give his urine sample and nephew tells me he seems himself.  At this point, disposition was discussed with family member.  He is comfortable with him returning home.  Patient does have a doctor's appointment tomorrow at 10 AM and I feel as though he can safely be discharged with PCP follow-up.       Veryl Speak, MD 11/12/20 [phone]

## 2020-11-17 ENCOUNTER — Emergency Department (HOSPITAL_COMMUNITY): Payer: Medicare Other

## 2020-11-17 ENCOUNTER — Observation Stay (HOSPITAL_COMMUNITY): Payer: Medicare Other

## 2020-11-17 ENCOUNTER — Encounter (HOSPITAL_COMMUNITY): Payer: Self-pay

## 2020-11-17 ENCOUNTER — Other Ambulatory Visit: Payer: Self-pay

## 2020-11-17 ENCOUNTER — Inpatient Hospital Stay (HOSPITAL_COMMUNITY)
Admission: EM | Admit: 2020-11-17 | Discharge: 2020-11-20 | DRG: 064 | Disposition: A | Discharge status: Skilled Nursing Facility | Payer: Medicare Other | Attending: Family Medicine | Admitting: Family Medicine

## 2020-11-17 DIAGNOSIS — R29703 NIHSS score 3: Secondary | ICD-10-CM | POA: Diagnosis not present

## 2020-11-17 DIAGNOSIS — G8194 Hemiplegia, unspecified affecting left nondominant side: Secondary | ICD-10-CM | POA: Diagnosis present

## 2020-11-17 DIAGNOSIS — R4182 Altered mental status, unspecified: Secondary | ICD-10-CM | POA: Diagnosis not present

## 2020-11-17 DIAGNOSIS — Z7401 Bed confinement status: Secondary | ICD-10-CM | POA: Diagnosis not present

## 2020-11-17 DIAGNOSIS — Z20822 Contact with and (suspected) exposure to covid-19: Secondary | ICD-10-CM | POA: Diagnosis present

## 2020-11-17 DIAGNOSIS — R29704 NIHSS score 4: Secondary | ICD-10-CM | POA: Diagnosis not present

## 2020-11-17 DIAGNOSIS — Z7902 Long term (current) use of antithrombotics/antiplatelets: Secondary | ICD-10-CM | POA: Diagnosis not present

## 2020-11-17 DIAGNOSIS — R6889 Other general symptoms and signs: Secondary | ICD-10-CM | POA: Diagnosis not present

## 2020-11-17 DIAGNOSIS — Z79899 Other long term (current) drug therapy: Secondary | ICD-10-CM | POA: Diagnosis not present

## 2020-11-17 DIAGNOSIS — I69398 Other sequelae of cerebral infarction: Secondary | ICD-10-CM | POA: Diagnosis not present

## 2020-11-17 DIAGNOSIS — Z09 Encounter for follow-up examination after completed treatment for conditions other than malignant neoplasm: Secondary | ICD-10-CM | POA: Diagnosis not present

## 2020-11-17 DIAGNOSIS — E876 Hypokalemia: Secondary | ICD-10-CM | POA: Diagnosis present

## 2020-11-17 DIAGNOSIS — R059 Cough, unspecified: Secondary | ICD-10-CM

## 2020-11-17 DIAGNOSIS — Z7189 Other specified counseling: Secondary | ICD-10-CM | POA: Diagnosis not present

## 2020-11-17 DIAGNOSIS — Z8249 Family history of ischemic heart disease and other diseases of the circulatory system: Secondary | ICD-10-CM

## 2020-11-17 DIAGNOSIS — R531 Weakness: Secondary | ICD-10-CM

## 2020-11-17 DIAGNOSIS — Z8042 Family history of malignant neoplasm of prostate: Secondary | ICD-10-CM

## 2020-11-17 DIAGNOSIS — R279 Unspecified lack of coordination: Secondary | ICD-10-CM | POA: Diagnosis not present

## 2020-11-17 DIAGNOSIS — G9341 Metabolic encephalopathy: Secondary | ICD-10-CM | POA: Diagnosis present

## 2020-11-17 DIAGNOSIS — I69354 Hemiplegia and hemiparesis following cerebral infarction affecting left non-dominant side: Secondary | ICD-10-CM | POA: Diagnosis not present

## 2020-11-17 DIAGNOSIS — Z634 Disappearance and death of family member: Secondary | ICD-10-CM

## 2020-11-17 DIAGNOSIS — R404 Transient alteration of awareness: Secondary | ICD-10-CM | POA: Diagnosis not present

## 2020-11-17 DIAGNOSIS — E7800 Pure hypercholesterolemia, unspecified: Secondary | ICD-10-CM | POA: Diagnosis not present

## 2020-11-17 DIAGNOSIS — G459 Transient cerebral ischemic attack, unspecified: Secondary | ICD-10-CM | POA: Diagnosis not present

## 2020-11-17 DIAGNOSIS — I639 Cerebral infarction, unspecified: Secondary | ICD-10-CM | POA: Diagnosis not present

## 2020-11-17 DIAGNOSIS — Z79890 Hormone replacement therapy (postmenopausal): Secondary | ICD-10-CM | POA: Diagnosis not present

## 2020-11-17 DIAGNOSIS — J690 Pneumonitis due to inhalation of food and vomit: Secondary | ICD-10-CM | POA: Diagnosis not present

## 2020-11-17 DIAGNOSIS — R29705 NIHSS score 5: Secondary | ICD-10-CM | POA: Diagnosis not present

## 2020-11-17 DIAGNOSIS — Z743 Need for continuous supervision: Secondary | ICD-10-CM | POA: Diagnosis not present

## 2020-11-17 DIAGNOSIS — E039 Hypothyroidism, unspecified: Secondary | ICD-10-CM | POA: Diagnosis present

## 2020-11-17 DIAGNOSIS — R410 Disorientation, unspecified: Secondary | ICD-10-CM | POA: Diagnosis not present

## 2020-11-17 DIAGNOSIS — G9340 Encephalopathy, unspecified: Secondary | ICD-10-CM | POA: Diagnosis present

## 2020-11-17 DIAGNOSIS — F0390 Unspecified dementia without behavioral disturbance: Secondary | ICD-10-CM | POA: Diagnosis present

## 2020-11-17 DIAGNOSIS — I10 Essential (primary) hypertension: Secondary | ICD-10-CM | POA: Diagnosis present

## 2020-11-17 DIAGNOSIS — I63233 Cerebral infarction due to unspecified occlusion or stenosis of bilateral carotid arteries: Secondary | ICD-10-CM | POA: Diagnosis not present

## 2020-11-17 DIAGNOSIS — R29702 NIHSS score 2: Secondary | ICD-10-CM | POA: Diagnosis not present

## 2020-11-17 DIAGNOSIS — R791 Abnormal coagulation profile: Secondary | ICD-10-CM | POA: Diagnosis not present

## 2020-11-17 DIAGNOSIS — R5381 Other malaise: Secondary | ICD-10-CM | POA: Diagnosis not present

## 2020-11-17 DIAGNOSIS — Z515 Encounter for palliative care: Secondary | ICD-10-CM | POA: Diagnosis not present

## 2020-11-17 DIAGNOSIS — H5462 Unqualified visual loss, left eye, normal vision right eye: Secondary | ICD-10-CM | POA: Diagnosis not present

## 2020-11-17 DIAGNOSIS — Z83438 Family history of other disorder of lipoprotein metabolism and other lipidemia: Secondary | ICD-10-CM | POA: Diagnosis not present

## 2020-11-17 DIAGNOSIS — Z8546 Personal history of malignant neoplasm of prostate: Secondary | ICD-10-CM

## 2020-11-17 LAB — URINALYSIS, ROUTINE W REFLEX MICROSCOPIC
Bilirubin Urine: NEGATIVE
Glucose, UA: NEGATIVE mg/dL
Hgb urine dipstick: NEGATIVE
Ketones, ur: 15 mg/dL — AB
Nitrite: NEGATIVE
Protein, ur: NEGATIVE mg/dL
Specific Gravity, Urine: 1.02 (ref 1.005–1.030)
pH: 6 (ref 5.0–8.0)

## 2020-11-17 LAB — URINALYSIS, MICROSCOPIC (REFLEX): RBC / HPF: NONE SEEN RBC/hpf (ref 0–5)

## 2020-11-17 LAB — COMPREHENSIVE METABOLIC PANEL
ALT: 20 U/L (ref 0–44)
AST: 26 U/L (ref 15–41)
Albumin: 3.3 g/dL — ABNORMAL LOW (ref 3.5–5.0)
Alkaline Phosphatase: 82 U/L (ref 38–126)
Anion gap: 6 (ref 5–15)
BUN: 11 mg/dL (ref 8–23)
CO2: 24 mmol/L (ref 22–32)
Calcium: 8.4 mg/dL — ABNORMAL LOW (ref 8.9–10.3)
Chloride: 108 mmol/L (ref 98–111)
Creatinine, Ser: 1.43 mg/dL — ABNORMAL HIGH (ref 0.61–1.24)
GFR, Estimated: 47 mL/min — ABNORMAL LOW (ref >60)
Glucose, Bld: 110 mg/dL — ABNORMAL HIGH (ref 70–99)
Potassium: 3.2 mmol/L — ABNORMAL LOW (ref 3.5–5.1)
Sodium: 138 mmol/L (ref 135–145)
Total Bilirubin: 0.8 mg/dL (ref 0.3–1.2)
Total Protein: 7.3 g/dL (ref 6.5–8.1)

## 2020-11-17 LAB — CBC WITH DIFFERENTIAL/PLATELET
Abs Immature Granulocytes: 0.02 10*3/uL (ref 0.00–0.07)
Basophils Absolute: 0.1 10*3/uL (ref 0.0–0.1)
Basophils Relative: 1 %
Eosinophils Absolute: 0.2 10*3/uL (ref 0.0–0.5)
Eosinophils Relative: 2 %
HCT: 35.9 % — ABNORMAL LOW (ref 39.0–52.0)
Hemoglobin: 11.7 g/dL — ABNORMAL LOW (ref 13.0–17.0)
Immature Granulocytes: 0 %
Lymphocytes Relative: 23 %
Lymphs Abs: 1.5 10*3/uL (ref 0.7–4.0)
MCH: 27.5 pg (ref 26.0–34.0)
MCHC: 32.6 g/dL (ref 30.0–36.0)
MCV: 84.5 fL (ref 80.0–100.0)
Monocytes Absolute: 0.7 10*3/uL (ref 0.1–1.0)
Monocytes Relative: 11 %
Neutro Abs: 4.2 10*3/uL (ref 1.7–7.7)
Neutrophils Relative %: 63 %
Platelets: 223 10*3/uL (ref 150–400)
RBC: 4.25 MIL/uL (ref 4.22–5.81)
RDW: 16.2 % — ABNORMAL HIGH (ref 11.5–15.5)
WBC: 6.7 10*3/uL (ref 4.0–10.5)
nRBC: 0 % (ref 0.0–0.2)

## 2020-11-17 LAB — TSH: TSH: 2.623 u[IU]/mL (ref 0.350–4.500)

## 2020-11-17 LAB — MAGNESIUM: Magnesium: 2 mg/dL (ref 1.7–2.4)

## 2020-11-17 MED ORDER — AMLODIPINE BESYLATE 5 MG PO TABS
10.0000 mg | ORAL_TABLET | Freq: Every day | ORAL | Status: DC
  Administered 2020-11-19 – 2020-11-20 (×2): 10 mg via ORAL
  Filled 2020-11-17 (×4): qty 2

## 2020-11-17 MED ORDER — LEVOTHYROXINE SODIUM 75 MCG PO TABS
75.0000 ug | ORAL_TABLET | Freq: Every day | ORAL | Status: DC
  Administered 2020-11-19 – 2020-11-20 (×2): 75 ug via ORAL
  Filled 2020-11-17 (×2): qty 1

## 2020-11-17 MED ORDER — SODIUM CHLORIDE 0.9 % IV BOLUS
500.0000 mL | Freq: Once | INTRAVENOUS | Status: AC
  Administered 2020-11-17: 500 mL via INTRAVENOUS

## 2020-11-17 MED ORDER — ENOXAPARIN SODIUM 40 MG/0.4ML IJ SOSY
40.0000 mg | PREFILLED_SYRINGE | INTRAMUSCULAR | Status: DC
  Administered 2020-11-17 – 2020-11-19 (×2): 40 mg via SUBCUTANEOUS
  Filled 2020-11-17 (×3): qty 0.4

## 2020-11-17 MED ORDER — ONDANSETRON HCL 4 MG/2ML IJ SOLN: 4.0000 mg | Freq: Four times a day (QID) | INTRAMUSCULAR | Status: DC | PRN

## 2020-11-17 MED ORDER — MEMANTINE HCL 10 MG PO TABS
10.0000 mg | ORAL_TABLET | Freq: Two times a day (BID) | ORAL | Status: DC
  Administered 2020-11-17 – 2020-11-20 (×4): 10 mg via ORAL
  Filled 2020-11-17 (×6): qty 1

## 2020-11-17 MED ORDER — DONEPEZIL HCL 5 MG PO TABS
10.0000 mg | ORAL_TABLET | Freq: Every day | ORAL | Status: DC
  Administered 2020-11-17 – 2020-11-19 (×2): 10 mg via ORAL
  Filled 2020-11-17 (×3): qty 2

## 2020-11-17 MED ORDER — KCL IN DEXTROSE-NACL 40-5-0.9 MEQ/L-%-% IV SOLN
INTRAVENOUS | Status: AC
  Filled 2020-11-17 (×2): qty 1000

## 2020-11-17 MED ORDER — ACETAMINOPHEN 325 MG PO TABS: 650.0000 mg | ORAL_TABLET | Freq: Four times a day (QID) | ORAL | Status: DC | PRN

## 2020-11-17 MED ORDER — CLOPIDOGREL BISULFATE 75 MG PO TABS
75.0000 mg | ORAL_TABLET | Freq: Every day | ORAL | Status: DC
  Administered 2020-11-19 – 2020-11-20 (×2): 75 mg via ORAL
  Filled 2020-11-17 (×3): qty 1

## 2020-11-17 MED ORDER — ACETAMINOPHEN 650 MG RE SUPP: 650.0000 mg | Freq: Four times a day (QID) | RECTAL | Status: DC | PRN

## 2020-11-17 MED ORDER — TAMSULOSIN HCL 0.4 MG PO CAPS
0.4000 mg | ORAL_CAPSULE | Freq: Every day | ORAL | Status: DC
  Administered 2020-11-19 – 2020-11-20 (×2): 0.4 mg via ORAL
  Filled 2020-11-17 (×3): qty 1

## 2020-11-17 MED ORDER — POLYETHYLENE GLYCOL 3350 17 G PO PACK: 17.0000 g | PACK | Freq: Every day | ORAL | Status: DC | PRN

## 2020-11-17 MED ORDER — ONDANSETRON HCL 4 MG PO TABS: 4.0000 mg | ORAL_TABLET | Freq: Four times a day (QID) | ORAL | Status: DC | PRN

## 2020-11-17 MED ORDER — ATORVASTATIN CALCIUM 40 MG PO TABS
40.0000 mg | ORAL_TABLET | Freq: Every day | ORAL | Status: DC
  Administered 2020-11-19 – 2020-11-20 (×2): 40 mg via ORAL
  Filled 2020-11-17 (×3): qty 1

## 2020-11-17 NOTE — ED Provider Notes (Signed)
[facility] Provider Note   CSN: [id number] Arrival date & time: 11/17/20  1310     History Chief Complaint  Patient presents with   Weakness    Eduardo Vasquez is a 87 y.o. male. Level 5 caveat due to dementia and altered mental status.  Weakness Patient brought in from physical therapy.  Reportedly sent in for a new stroke.  Patient did have a stroke the in the last month on the 27th.  Left-sided deficits but reportedly had some improvement.  Had been following up.  Per family has had episodes coming and going of worsening symptoms.  Will have left-sided weakness.  Seen in the ER about a week ago consistently by me for similar symptoms.  Reportedly normally walks well but had been walking with more of a shuffling gait.  Unsure exactly what the deficits were at this time but reportedly a new stroke.  Reportedly more confused.  Reportedly has episodes where he will also stare ahead with less responsiveness.    Past Medical History:  Diagnosis Date   Cancer [facility]) 2009   Prostate   Carotid artery occlusion    Dementia ([place])    Hypercholesteremia    Hypertension    Stroke [facility]) 2003   Mini - Left Eye- Partial Blindness   Urgency incontinence     Patient Active Problem List   Diagnosis Date Noted   Stroke determined by clinical assessment ([place]) 11/07/2020   Acute right MCA stroke ([place]) 11/07/2020   Occlusion and stenosis of carotid artery without mention of cerebral infarction 11/03/2011    Past Surgical History:  Procedure Laterality Date   INGUINAL HERNIA REPAIR Right 12/02/2013   Procedure: HERNIA REPAIR INGUINAL ADULT WITH MESH;  Surgeon: Jamesetta So, MD;  Location: [facility];  Service: General;  Laterality: Right;   INSERTION OF MESH Right 12/02/2013   Procedure: INSERTION OF MESH;  Surgeon: Jamesetta So, MD;  Location: [facility];  Service: General;  Laterality: Right;       Family History  Problem Relation Age of Onset   Hypertension Mother     Hyperlipidemia Mother    Hypertension Father    Hyperlipidemia Father    Diabetes Daughter    Prostate cancer Brother    Hypertension Brother     Social History   Tobacco Use   Smoking status: Never   Smokeless tobacco: Never  Substance Use Topics   Alcohol use: No    Alcohol/week: 0.0 standard drinks    Comment: 27 years alcohol free in AA   Drug use: No    Home Medications Prior to Admission medications   Medication Sig Start Date End Date Taking? Authorizing Provider  amLODipine (NORVASC) 10 MG tablet Take 10 mg by mouth daily. 10/12/15   [provider]  atorvastatin (LIPITOR) 40 MG tablet Take 1 tablet (40 mg total) by mouth daily. 11/10/20   Einar Pheasant, NP  clopidogrel (PLAVIX) 75 MG tablet Take 1 tablet (75 mg total) by mouth daily. 11/10/20   Einar Pheasant, NP  donepezil (ARICEPT) 10 MG tablet TAKE 1 TABLET(10 MG) BY MOUTH AT BEDTIME Patient taking differently: Take 10 mg by mouth at bedtime. 02/27/17   Penumalli, Earlean Polka, MD  doxazosin (CARDURA) 4 MG tablet Take 4 mg by mouth daily.    [provider]  levothyroxine (SYNTHROID) 75 MCG tablet Take 75 mcg by mouth daily. 10/22/20   [provider]  LUMIGAN 0.01 % SOLN Place 1 drop  into both eyes daily as needed (dry eyes). As needed 12/17/15   [provider]  memantine (NAMENDA) 10 MG tablet Take 1 tablet (10 mg total) by mouth 2 (two) times daily. 01/26/16   Penumalli, Earlean Polka, MD  Multiple Vitamin (MULTIVITAMIN) tablet Take 1 tablet by mouth daily.    [provider]  Tamsulosin HCl (FLOMAX) 0.4 MG CAPS Take 0.4 mg by mouth daily.    [provider]    Allergies    Patient has no known allergies.  Review of Systems   Review of Systems  Unable to perform ROS: Dementia  Neurological:  Positive for weakness.   Physical Exam Updated Vital Signs BP (!) 165/86   Pulse 64   Temp 98.9 F (37.2 C) (Oral)   Resp 13   Ht 6' (1.829 m)   Wt 99.3 kg   SpO2  99%   BMI 29.70 kg/m   Physical Exam Vitals and nursing note reviewed.  HENT:     Head: Atraumatic.  Eyes:     Extraocular Movements: Extraocular movements intact.     Pupils: Pupils are equal, round, and reactive to light.  Cardiovascular:     Rate and Rhythm: Regular rhythm.  Pulmonary:     Breath sounds: No wheezing or rhonchi.  Abdominal:     Tenderness: There is no abdominal tenderness.  Musculoskeletal:        General: No tenderness.     Cervical back: Neck supple.  Skin:    General: Skin is warm.     Capillary Refill: Capillary refill takes less than 2 seconds.  Neurological:     Mental Status: He is alert.     Comments: PleurxAwake and pleasant but some confusion.  Denies daughter at the bedside.  Per daughter patient with usually recognize her has a different daughter.  Face symmetric.  Eye movements grossly intact.  Able to move all extremities but potentially slightly weak on the left compared to right.  Not ambulated.    ED Results / Procedures / Treatments   Labs (all labs ordered are listed, but only abnormal results are displayed) Labs Reviewed  COMPREHENSIVE METABOLIC PANEL - Abnormal; Notable for the following components:      Result Value   Potassium 3.2 (*)    Glucose, Bld 110 (*)    Creatinine, Ser 1.43 (*)    Calcium 8.4 (*)    Albumin 3.3 (*)    GFR, Estimated 47 (*)    All other components within normal limits  CBC WITH DIFFERENTIAL/PLATELET - Abnormal; Notable for the following components:   Hemoglobin 11.7 (*)    HCT 35.9 (*)    RDW 16.2 (*)    All other components within normal limits  URINALYSIS, ROUTINE W REFLEX MICROSCOPIC    EKG None  Radiology No results found.  Procedures Procedures   Medications Ordered in ED Medications - No data to display  ED Course  I have reviewed the triage vital signs and the nursing notes.  Pertinent labs & imaging results that were available during my care of the patient were reviewed by me and  considered in my medical decision making (see chart for details).    MDM Rules/Calculators/A&P                           Patient presents with recurrent mental status change and potentially worsening weakness.  Recent stroke.  Had recently had reimaging for same.  Had  had previous MRI that showed potential amyloidosis and stroke along with some hemorrhages.  Repeat CT was reassuring on the 31st.  However with staring episodes of potentially waxing waning deficits discussed with Dr. Curly Shores from neurology.  We feel patient benefit from further work-up.  Will get head CT here.  If reassuring will need MRI and EEG.  Also face-to-face neurology consult.  Could be done as an outpatient but patient has been doing worse at home.  More difficulty getting activities done.  May even need more help.  Will discuss with hospitalist. Final Clinical Impression(s) / ED Diagnoses Final diagnoses:  Weakness    Rx / DC Orders ED Discharge Orders     None        Davonna Belling, MD 11/17/20 [phone]

## 2020-11-17 NOTE — ED Provider Notes (Signed)
Care of the patient assumed at the change of shift pending hospitalist consultation to admit for AMS, recent stroke and concern for possible seizures.  Physical Exam  BP (!) 169/88   Pulse 63   Temp 98.9 F (37.2 C) (Oral)   Resp 10   Ht 6' (1.829 m)   Wt 99.3 kg   SpO2 99%   BMI 29.70 kg/m   Physical Exam  ED Course/Procedures     Procedures  MDM  Spoke with Dr. Arlyce Dice, who will evaluate for admission.        Truddie Hidden, MD 11/17/20 [phone]

## 2020-11-17 NOTE — H&P (Signed)
History and Physical    Eduardo Vasquez [id number] DOB: [birth year] DOA: 11/17/2020  PCP: Rosita Fire, MD   Patient coming from: Home  I have personally briefly reviewed patient's old medical records in [place]  Chief Complaint: Weakness  HPI: Eduardo Vasquez is a 87 y.o. male with medical history significant for CVA, dementia, HTN.  Brought to the ED reports of generalized weakness.  History from patient is limited, he is awake alert, but not able to answer questions.  Daughter present at bedside assist with the history, says over the past few days patient has not been able to walk, requiring lots of assistance.  Patient was recently admitted 8/27-8/29, came in with left-sided weakness and slurred speech, transferred to [facility] under neurology service for large right basal infarct of cryptogenic etiology likely cardioembolic.  NIHSS was 6, patient was determined to be a candidate for IV tPA and this was given.  Further work-up with event monitor deferred as he is not a good candidate for anticoagulation due to dementia and microhemorrhages that were noted on MRI brain.  Patient was seen for receiving physical therapy at home today, and provider felt patient was significantly different from when he was seen after hospitalization.  Daughter denies confusion to me, but reports poor oral intake over the past 2 days, not swallowing.  Also reports of a cough over the past 2 days.  Patient was brought to the ED 8/31, after hospital discharge, for staring spells, head CT and work-up was unremarkable, he was back to his baseline so he was discharged from the ED.  Daughter is unaware of any further staring spells since then.  Patient has baseline dementia, mostly involving short-term memory, he is able to recognize family.  ED Course: Temp 98.9.  Heart rate 60s.  Blood pressure 123456 systolic.  O2 sats 98-100 on room air, potassium 3.2.  WBC 6.7.  Creatinine stable 1.43.  Head CT  without acute abnormality. EDP talked to neurologist Dr. Curly Shores, recommended admission, MRI, EEG.  Review of Systems: As per HPI all other systems reviewed and negative.  Past Medical History:  Diagnosis Date   Cancer [facility]) 2009   Prostate   Carotid artery occlusion    Dementia ([place])    Hypercholesteremia    Hypertension    Stroke [facility]) 2003   Mini - Left Eye- Partial Blindness   Urgency incontinence     Past Surgical History:  Procedure Laterality Date   INGUINAL HERNIA REPAIR Right 12/02/2013   Procedure: HERNIA REPAIR INGUINAL ADULT WITH MESH;  Surgeon: Jamesetta So, MD;  Location: [facility];  Service: General;  Laterality: Right;   INSERTION OF MESH Right 12/02/2013   Procedure: INSERTION OF MESH;  Surgeon: Jamesetta So, MD;  Location: [facility];  Service: General;  Laterality: Right;     reports that he has never smoked. He has never used smokeless tobacco. He reports that he does not drink alcohol and does not use drugs.  No Known Allergies  Family History  Problem Relation Age of Onset   Hypertension Mother    Hyperlipidemia Mother    Hypertension Father    Hyperlipidemia Father    Diabetes Daughter    Prostate cancer Brother    Hypertension Brother     Prior to Admission medications   Medication Sig Start Date End Date Taking? Authorizing Provider  amLODipine (NORVASC) 10 MG tablet Take 10 mg by mouth daily. 10/12/15   [provider]  atorvastatin (  LIPITOR) 40 MG tablet Take 1 tablet (40 mg total) by mouth daily. 11/10/20   Einar Pheasant, NP  clopidogrel (PLAVIX) 75 MG tablet Take 1 tablet (75 mg total) by mouth daily. 11/10/20   Einar Pheasant, NP  donepezil (ARICEPT) 10 MG tablet TAKE 1 TABLET(10 MG) BY MOUTH AT BEDTIME Patient taking differently: Take 10 mg by mouth at bedtime. 02/27/17   Penumalli, Earlean Polka, MD  doxazosin (CARDURA) 4 MG tablet Take 4 mg by mouth daily.    [provider]  levothyroxine (SYNTHROID) 75 MCG tablet Take 75  mcg by mouth daily. 10/22/20   [provider]  LUMIGAN 0.01 % SOLN Place 1 drop into both eyes daily as needed (dry eyes). As needed 12/17/15   [provider]  memantine (NAMENDA) 10 MG tablet Take 1 tablet (10 mg total) by mouth 2 (two) times daily. 01/26/16   Penumalli, Earlean Polka, MD  Multiple Vitamin (MULTIVITAMIN) tablet Take 1 tablet by mouth daily.    [provider]  Tamsulosin HCl (FLOMAX) 0.4 MG CAPS Take 0.4 mg by mouth daily.    [provider]    Physical Exam: Vitals:   11/17/20 1329 11/17/20 1332 11/17/20 1430  BP: (!) 169/86  (!) 165/86  Pulse: 68  64  Resp: 16  13  Temp: 98.9 F (37.2 C)    TempSrc: Oral    SpO2: 100%  99%  Weight:  99.3 kg   Height:  6' (1.829 m)     Constitutional: NAD, calm, comfortable Vitals:   11/17/20 1329 11/17/20 1332 11/17/20 1430  BP: (!) 169/86  (!) 165/86  Pulse: 68  64  Resp: 16  13  Temp: 98.9 F (37.2 C)    TempSrc: Oral    SpO2: 100%  99%  Weight:  99.3 kg   Height:  6' (1.829 m)    Eyes: PERRL, lids and conjunctivae normal ENMT: Mucous membranes are dry Neck: normal, supple, no masses, no thyromegaly Respiratory: clear to auscultation bilaterally, no wheezing, no crackles. Normal respiratory effort. No accessory muscle use.  Cardiovascular: Regular rate and rhythm, no murmurs / rubs / gallops. No extremity edema. 2+ pedal pulses.   Abdomen: no tenderness, no masses palpated. No hepatosplenomegaly. Bowel sounds positive.  Musculoskeletal: no clubbing / cyanosis. No joint deformity upper and lower extremities. Good ROM, no contractures. Normal muscle tone.  Skin: no rashes, lesions, ulcers. No induration Neurologic: No facial asymmetry, he is able to tell me his name clearly without evidence of aphasia, 4+/5 strength in all extremities.  Sensation intact.  Able to follow one-step directions. Psychiatric: Alert and oriented to person only.  Unable to tell me place or why he is in the  hospital.  Labs on Admission: I have personally reviewed following labs and imaging studies  CBC: Recent Labs  Lab 11/11/20 2239 11/17/20 1336  WBC 7.0 6.7  NEUTROABS 4.2 4.2  HGB 11.6* 11.7*  HCT 35.7* 35.9*  MCV 85.4 84.5  PLT 192 [id number]   Basic Metabolic Panel: Recent Labs  Lab 11/11/20 2239 11/17/20 1336  NA 138 138  K 3.6 3.2*  CL 108 108  CO2 23 24  GLUCOSE 112* 110*  BUN 25* 11  CREATININE 1.71* 1.43*  CALCIUM 8.5* 8.4*   Liver Function Tests: Recent Labs  Lab 11/11/20 2239 11/17/20 1336  AST 27 26  ALT 18 20  ALKPHOS 68 82  BILITOT 0.4 0.8  PROT 7.1 7.3  ALBUMIN 3.3* 3.3*   Radiological  Exams on Admission: CT HEAD WO CONTRAST (5MM)  Result Date: 11/17/2020 CLINICAL DATA:  Mental status change, unknown cause EXAM: CT HEAD WITHOUT CONTRAST TECHNIQUE: Contiguous axial images were obtained from the base of the skull through the vertex without intravenous contrast. COMPARISON:  11/11/2020 FINDINGS: Brain: No evidence of acute infarction, hemorrhage, hydrocephalus, extra-axial collection or mass lesion/mass effect. Moderate low-density changes within the periventricular and subcortical white matter compatible with chronic microvascular ischemic change. Mild diffuse cerebral volume loss. Vascular: Atherosclerotic calcifications involving the large vessels of the skull base. No unexpected hyperdense vessel. Skull: Normal. Negative for fracture or focal lesion. Sinuses/Orbits: No acute finding. Other: None. IMPRESSION: 1. No acute intracranial findings. 2. Chronic microvascular ischemic change and cerebral volume loss. Electronically Signed   By: Davina Poke D.O.   On: 11/17/2020 15:43    EKG: Independently reviewed.  Sinus rhythm rate 67, QTc 465.  No significant abnormalities compared to prior EKG.  Assessment/Plan Principal Problem:   Generalized weakness Active Problems:   Stroke determined by clinical assessment (HCC)   Generalized weakness- with poor oral  intake, cough and problems with swallowing.  Initial reports of confusion, staring spells raising concern for stroke, seizures.  CT without Acute abnormality. - EDP talked with Dr. Curly Shores, recommended MRI, EEG - Failed Bedside swallow evaluation in the ED - PT, Speech Therapy evaluation -Obtain chest xray -follow-up UA -Remain n.p.o. -Check magnesium, TSH, B12, folate - 522m bolus, continue D5 N/s + 40 KCL 75cc/hr x 15hrs  Hypokalemia-potassium 3.2 -Check magnesium  Stroke-with left-sided weakness and slurred speech, given IV tPA.  Today he has 4+ strength in all extremities no evidence of aphasia. -Resume Plavix,, statins  HTN- Elevated - Resume Norvasc -On duplicate medications Cardura and tamsulosin, continue tamsulosin  DVT prophylaxis: Lovenox Code Status: Full code Family Communication: Daughter- Eduardo Vasquez at bedside, does not live with patient.  But Patient's other daughter Eduardo Crumblelives with patient. Disposition Plan: ~ 1 - 2 days Consults called: None Admission status: Obs, tele   EBethena RoysMD [facility]  11/17/2020, 6:03 PM

## 2020-11-17 NOTE — ED Triage Notes (Signed)
Pt to er room number 6 via ems, MD Pickering at bedside, daughter called and brought back to room, pt oriented to self, daughter states that pt is here for increased weakness.  PT sent pt to er for r/o cva, daughter states that this has been going on for the past several days.

## 2020-11-18 ENCOUNTER — Observation Stay (HOSPITAL_COMMUNITY): Payer: Medicare Other

## 2020-11-18 ENCOUNTER — Observation Stay (HOSPITAL_COMMUNITY)
Admit: 2020-11-18 | Discharge: 2020-11-18 | Disposition: A | Discharge status: Home / Self Care | Payer: Medicare Other | Attending: Internal Medicine | Admitting: Internal Medicine

## 2020-11-18 DIAGNOSIS — R29703 NIHSS score 3: Secondary | ICD-10-CM | POA: Diagnosis not present

## 2020-11-18 DIAGNOSIS — R791 Abnormal coagulation profile: Secondary | ICD-10-CM | POA: Diagnosis not present

## 2020-11-18 DIAGNOSIS — E876 Hypokalemia: Secondary | ICD-10-CM | POA: Diagnosis present

## 2020-11-18 DIAGNOSIS — R4182 Altered mental status, unspecified: Secondary | ICD-10-CM

## 2020-11-18 DIAGNOSIS — I10 Essential (primary) hypertension: Secondary | ICD-10-CM | POA: Diagnosis present

## 2020-11-18 DIAGNOSIS — Z8546 Personal history of malignant neoplasm of prostate: Secondary | ICD-10-CM | POA: Diagnosis not present

## 2020-11-18 DIAGNOSIS — Z79899 Other long term (current) drug therapy: Secondary | ICD-10-CM | POA: Diagnosis not present

## 2020-11-18 DIAGNOSIS — J690 Pneumonitis due to inhalation of food and vomit: Secondary | ICD-10-CM | POA: Diagnosis not present

## 2020-11-18 DIAGNOSIS — I639 Cerebral infarction, unspecified: Secondary | ICD-10-CM | POA: Diagnosis present

## 2020-11-18 DIAGNOSIS — Z09 Encounter for follow-up examination after completed treatment for conditions other than malignant neoplasm: Secondary | ICD-10-CM | POA: Diagnosis not present

## 2020-11-18 DIAGNOSIS — R29702 NIHSS score 2: Secondary | ICD-10-CM | POA: Diagnosis not present

## 2020-11-18 DIAGNOSIS — Z83438 Family history of other disorder of lipoprotein metabolism and other lipidemia: Secondary | ICD-10-CM | POA: Diagnosis not present

## 2020-11-18 DIAGNOSIS — Z7902 Long term (current) use of antithrombotics/antiplatelets: Secondary | ICD-10-CM | POA: Diagnosis not present

## 2020-11-18 DIAGNOSIS — G9341 Metabolic encephalopathy: Secondary | ICD-10-CM | POA: Diagnosis present

## 2020-11-18 DIAGNOSIS — Z8042 Family history of malignant neoplasm of prostate: Secondary | ICD-10-CM | POA: Diagnosis not present

## 2020-11-18 DIAGNOSIS — R531 Weakness: Secondary | ICD-10-CM | POA: Diagnosis present

## 2020-11-18 DIAGNOSIS — E7800 Pure hypercholesterolemia, unspecified: Secondary | ICD-10-CM | POA: Diagnosis present

## 2020-11-18 DIAGNOSIS — E039 Hypothyroidism, unspecified: Secondary | ICD-10-CM | POA: Diagnosis present

## 2020-11-18 DIAGNOSIS — Z79890 Hormone replacement therapy (postmenopausal): Secondary | ICD-10-CM | POA: Diagnosis not present

## 2020-11-18 DIAGNOSIS — G8194 Hemiplegia, unspecified affecting left nondominant side: Secondary | ICD-10-CM | POA: Diagnosis present

## 2020-11-18 DIAGNOSIS — R404 Transient alteration of awareness: Secondary | ICD-10-CM | POA: Diagnosis not present

## 2020-11-18 DIAGNOSIS — G9340 Encephalopathy, unspecified: Secondary | ICD-10-CM | POA: Diagnosis present

## 2020-11-18 DIAGNOSIS — Z515 Encounter for palliative care: Secondary | ICD-10-CM | POA: Diagnosis not present

## 2020-11-18 DIAGNOSIS — Z8249 Family history of ischemic heart disease and other diseases of the circulatory system: Secondary | ICD-10-CM | POA: Diagnosis not present

## 2020-11-18 DIAGNOSIS — Z7189 Other specified counseling: Secondary | ICD-10-CM | POA: Diagnosis not present

## 2020-11-18 DIAGNOSIS — R410 Disorientation, unspecified: Secondary | ICD-10-CM | POA: Diagnosis present

## 2020-11-18 DIAGNOSIS — R29704 NIHSS score 4: Secondary | ICD-10-CM | POA: Diagnosis not present

## 2020-11-18 DIAGNOSIS — H5462 Unqualified visual loss, left eye, normal vision right eye: Secondary | ICD-10-CM | POA: Diagnosis present

## 2020-11-18 DIAGNOSIS — I63233 Cerebral infarction due to unspecified occlusion or stenosis of bilateral carotid arteries: Secondary | ICD-10-CM | POA: Diagnosis not present

## 2020-11-18 DIAGNOSIS — Z634 Disappearance and death of family member: Secondary | ICD-10-CM | POA: Diagnosis not present

## 2020-11-18 DIAGNOSIS — Z20822 Contact with and (suspected) exposure to covid-19: Secondary | ICD-10-CM | POA: Diagnosis present

## 2020-11-18 DIAGNOSIS — I69398 Other sequelae of cerebral infarction: Secondary | ICD-10-CM | POA: Diagnosis not present

## 2020-11-18 DIAGNOSIS — R29705 NIHSS score 5: Secondary | ICD-10-CM | POA: Diagnosis not present

## 2020-11-18 DIAGNOSIS — F0390 Unspecified dementia without behavioral disturbance: Secondary | ICD-10-CM | POA: Diagnosis present

## 2020-11-18 LAB — CBC
HCT: 34.8 % — ABNORMAL LOW (ref 39.0–52.0)
Hemoglobin: 11.3 g/dL — ABNORMAL LOW (ref 13.0–17.0)
MCH: 27.5 pg (ref 26.0–34.0)
MCHC: 32.5 g/dL (ref 30.0–36.0)
MCV: 84.7 fL (ref 80.0–100.0)
Platelets: 215 10*3/uL (ref 150–400)
RBC: 4.11 MIL/uL — ABNORMAL LOW (ref 4.22–5.81)
RDW: 16.2 % — ABNORMAL HIGH (ref 11.5–15.5)
WBC: 8.1 10*3/uL (ref 4.0–10.5)
nRBC: 0 % (ref 0.0–0.2)

## 2020-11-18 LAB — FOLATE: Folate: 55.5 ng/mL (ref >5.9)

## 2020-11-18 LAB — BASIC METABOLIC PANEL
Anion gap: 10 (ref 5–15)
BUN: 10 mg/dL (ref 8–23)
CO2: 21 mmol/L — ABNORMAL LOW (ref 22–32)
Calcium: 8.4 mg/dL — ABNORMAL LOW (ref 8.9–10.3)
Chloride: 109 mmol/L (ref 98–111)
Creatinine, Ser: 1.26 mg/dL — ABNORMAL HIGH (ref 0.61–1.24)
GFR, Estimated: 55 mL/min — ABNORMAL LOW (ref >60)
Glucose, Bld: 132 mg/dL — ABNORMAL HIGH (ref 70–99)
Potassium: 3.1 mmol/L — ABNORMAL LOW (ref 3.5–5.1)
Sodium: 140 mmol/L (ref 135–145)

## 2020-11-18 LAB — VITAMIN B12: Vitamin B-12: 912 pg/mL (ref 180–914)

## 2020-11-18 LAB — GLUCOSE, CAPILLARY
Glucose-Capillary: 112 mg/dL — ABNORMAL HIGH (ref 70–99)
Glucose-Capillary: 144 mg/dL — ABNORMAL HIGH (ref 70–99)

## 2020-11-18 LAB — SARS CORONAVIRUS 2 (TAT 6-24 HRS): SARS Coronavirus 2: NEGATIVE

## 2020-11-18 MED ORDER — HALOPERIDOL LACTATE 5 MG/ML IJ SOLN
2.0000 mg | Freq: Once | INTRAMUSCULAR | Status: AC
  Administered 2020-11-18: 2 mg via INTRAVENOUS
  Filled 2020-11-18: qty 1

## 2020-11-18 MED ORDER — LORAZEPAM 2 MG/ML IJ SOLN
0.5000 mg | Freq: Once | INTRAMUSCULAR | Status: AC
  Administered 2020-11-18: 0.5 mg via INTRAVENOUS
  Filled 2020-11-18: qty 1

## 2020-11-18 NOTE — Evaluation (Signed)
Physical Therapy Evaluation Patient Details Name: Eduardo Vasquez MRN: [id number] DOB: 1929-07-11 Today's Date: 11/18/2020   History of Present Illness  Eduardo Vasquez is a 87 y.o. male with medical history significant for CVA, dementia, HTN.  Brought to the ED reports of generalized weakness.  History from patient is limited, he is awake alert, but not able to answer questions.  Daughter present at bedside assist with the history, says over the past few days patient has not been able to walk, requiring lots of assistance.   Clinical Impression  Patient demonstrates slow labored movement for sitting up at bedside, very unsteady on feet and has most difficulty during stand to sitting due not reaching behind for armrest of chair or bed, tends to fall backwards into chair during transfers.  Patient required Max tactile cueing to help advance LLE due to weakness and put back to bed after therapy due to awaiting a procedure.  Patient will benefit from continued physical therapy in hospital and recommended venue below to increase strength, balance, endurance for safe ADLs and gait.      Follow Up Recommendations SNF    Equipment Recommendations  Rolling walker with 5" wheels;3in1 (PT)    Recommendations for Other Services       Precautions / Restrictions Precautions Precautions: Fall Restrictions Weight Bearing Restrictions: No      Mobility  Bed Mobility Overal bed mobility: Needs Assistance Bed Mobility: Supine to Sit;Sit to Supine     Supine to sit: Mod assist;Max assist;HOB elevated Sit to supine: Mod assist;Max assist   General bed mobility comments: slow labored movement    Transfers Overall transfer level: Needs assistance Equipment used: Rolling walker (2 wheeled) Transfers: Sit to/from [facility] Sit to Stand: Mod assist;Max assist Stand pivot transfers: Mod assist;Max assist       General transfer comment: very unsteady on feet with diffiuclty  advancing LLE due to weakness  Ambulation/Gait Ambulation/Gait assistance: Max assist;Mod assist Gait Distance (Feet): 5 Feet Assistive device: Rolling walker (2 wheeled) Gait Pattern/deviations: Decreased step length - right;Decreased stance time - left;Decreased stride length;Shuffle Gait velocity: decreased   General Gait Details: limited to few slow labored unsteady side steps requiring Max tactile cueing to advance LLE due to weakness, limited mostly due to generalized weakness and poor standing balance  Stairs            Wheelchair Mobility    Modified Rankin (Stroke Patients Only)       Balance Overall balance assessment: Needs assistance Sitting-balance support: Feet supported;No upper extremity supported Sitting balance-Leahy Scale: Fair Sitting balance - Comments: seated at EOB   Standing balance support: During functional activity;Bilateral upper extremity supported Standing balance-Leahy Scale: Poor Standing balance comment: using RW                             Pertinent Vitals/Pain Pain Assessment: No/denies pain    Home Living Family/patient expects to be discharged to:: Private residence Living Arrangements: Children Available Help at Discharge: Family;Available 24 hours/day Type of Home: House Home Access: Level entry     Home Layout: One level Home Equipment: Shower seat;Grab bars - tub/shower      Prior Function Level of Independence: Needs assistance   Gait / Transfers Assistance Needed: household and short distanced community ambulator without AD  ADL's / Homemaking Assistance Needed: assisted by family        Hand Dominance   Dominant Hand: Right  Extremity/Trunk Assessment   Upper Extremity Assessment Upper Extremity Assessment: Generalized weakness    Lower Extremity Assessment Lower Extremity Assessment: Generalized weakness       Communication   Communication: No difficulties  Cognition  Arousal/Alertness: Awake/alert Behavior During Therapy: WFL for tasks assessed/performed Overall Cognitive Status: History of cognitive impairments - at baseline                                        General Comments      Exercises     Assessment/Plan    PT Assessment Patient needs continued PT services  PT Problem List Decreased strength;Decreased activity tolerance;Decreased balance;Decreased mobility       PT Treatment Interventions DME instruction;Gait training;Stair training;Functional mobility training;Therapeutic activities;Therapeutic exercise;Patient/family education;Balance training    PT Goals (Current goals can be found in the Care Plan section)  Acute Rehab PT Goals Patient Stated Goal: return home PT Goal Formulation: With patient/family Time For Goal Achievement: 12/02/20 Potential to Achieve Goals: Good    Frequency Min 3X/week   Barriers to discharge        Co-evaluation               AM-PAC PT "6 Clicks" Mobility  Outcome Measure Help needed turning from your back to your side while in a flat bed without using bedrails?: A Lot Help needed moving from lying on your back to sitting on the side of a flat bed without using bedrails?: A Lot Help needed moving to and from a bed to a chair (including a wheelchair)?: A Lot Help needed standing up from a chair using your arms (e.g., wheelchair or bedside chair)?: A Lot Help needed to walk in hospital room?: A Lot Help needed climbing 3-5 steps with a railing? : Total 6 Click Score: 11    End of Session   Activity Tolerance: Patient tolerated treatment well;Patient limited by fatigue Patient left: in bed;with call bell/phone within reach;with family/visitor present Nurse Communication: Mobility status PT Visit Diagnosis: Unsteadiness on feet (R26.81);Other abnormalities of gait and mobility (R26.89);History of falling (Z91.81);Muscle weakness (generalized) (M62.81)    Time:  1000-1029 PT Time Calculation (min) (ACUTE ONLY): 29 min   Charges:   PT Evaluation $PT Eval Moderate Complexity: 1 Mod PT Treatments $Therapeutic Activity: 23-37 mins        2:59 PM, 11/18/20 Lonell Grandchild, MPT Physical Therapist with [facility] [phone] office [phone] mobile phone

## 2020-11-18 NOTE — Progress Notes (Signed)
Family notified and are at bedside with patient.  Family arrived at approximately 0500.  Family very appreciative of phone call about patient status and are attempting to calm patient.

## 2020-11-18 NOTE — Progress Notes (Addendum)
Patient continues to be confused.  Consistently attempts to get out of bed without assistance and is difficult to redirect. Patient is combative and argumentative due to cognition.  Patient has been 1: 1 with staff since admit to floor.  Will attempt to notify daughter again.

## 2020-11-18 NOTE — Progress Notes (Signed)
Patient is confused, and argumentative and trying to get out of bed without assistance.  Difficulty reorienting patient due to dementia.  Attempted to call daughter x 2,with no response.

## 2020-11-18 NOTE — Evaluation (Signed)
Clinical/Bedside Swallow Evaluation Patient Details  Name: Eduardo Vasquez MRN: [id number] Date of Birth: [birth year]  Today's Date: 11/18/2020 Time: SLP Start Time (ACUTE ONLY): 69 SLP Stop Time (ACUTE ONLY): 1240 SLP Time Calculation (min) (ACUTE ONLY): 20 min  Past Medical History:  Past Medical History:  Diagnosis Date   Cancer ([place]) 2009   Prostate   Carotid artery occlusion    Dementia ([place])    Hypercholesteremia    Hypertension    Stroke [facility]) 2003   Mini - Left Eye- Partial Blindness   Urgency incontinence    Past Surgical History:  Past Surgical History:  Procedure Laterality Date   INGUINAL HERNIA REPAIR Right 12/02/2013   Procedure: HERNIA REPAIR INGUINAL ADULT WITH MESH;  Surgeon: Jamesetta So, MD;  Location: [facility];  Service: General;  Laterality: Right;   INSERTION OF MESH Right 12/02/2013   Procedure: INSERTION OF MESH;  Surgeon: Jamesetta So, MD;  Location: [facility];  Service: General;  Laterality: Right;   HPI:  87 y.o. male with medical history significant for CVA, dementia, HTN.  Brought to the ED reports of generalized weakness and ambulatory difficulty.  Recently admitted 8/20 7-10/2027 for rash right-sided basal infarct status post tPA but not a good candidate for long-term anticoagulation due to microhemorrhages on MRI.  Upon admission CT head was negative, neurology was consulted who recommended MRI and EEG. Pt with  with poor oral intake, cough and problems with swallowing.  Initial reports of confusion, staring spells raising concern for stroke, seizures.  CT without Acute abnormality. BSE requested. He had a BSE at [facility] on 8/29 during recent admission with recommendation for regular textures and thin liquis.   Assessment / Plan / Recommendation Clinical Impression  Pt presents with change in swallow function compared to BSE completed during recent hospitalization in setting of reduced alertness and possible new stroke (pt with greater left facial  asymmetry). Pt required mod/max cues for alertness, but was able to drink ~200cc water via straw sips, pure, and small bites of graham cracker in puree. Pt with impaired lingual manipulation and prolonged oral transit with solids. Recommend D2/fine chop and thin liquids with feeder assist and po only when alert and upright, po medications crushed as able in puree and SLP to follow up tomorrow.     SLP Visit Diagnosis: Dysphagia, unspecified (R13.10)    Aspiration Risk  Mild aspiration risk    Diet Recommendation Dysphagia 2 (Fine chop);Thin liquid   Liquid Administration via: Cup;Straw Medication Administration: Crushed with puree Supervision: Staff to assist with self feeding;Full supervision/cueing for compensatory strategies Compensations: Minimize environmental distractions;Slow rate;Small sips/bites Postural Changes: Seated upright at 90 degrees;Remain upright for at least 30 minutes after po intake    Other  Recommendations Oral Care Recommendations: Oral care BID;Staff/trained caregiver to provide oral care Other Recommendations: Clarify dietary restrictions   Follow up Recommendations Skilled Nursing facility      Frequency and Duration min 2x/week  1 week       Prognosis Prognosis for Safe Diet Advancement: Fair Barriers to Reach Goals: Severity of deficits (lethargy)      Swallow Study   General Date of Onset: 11/17/20 HPI: 87 y.o. male with medical history significant for CVA, dementia, HTN.  Brought to the ED reports of generalized weakness and ambulatory difficulty.  Recently admitted 8/20 7-10/2027 for rash right-sided basal infarct status post tPA but not a good candidate for long-term anticoagulation due to microhemorrhages on MRI.  Upon admission CT  head was negative, neurology was consulted who recommended MRI and EEG. Pt with  with poor oral intake, cough and problems with swallowing.  Initial reports of confusion, staring spells raising concern for stroke,  seizures.  CT without Acute abnormality. BSE requested. He had a BSE at [facility] on 8/29 during recent admission with recommendation for regular textures and thin liquis. Type of Study: Bedside Swallow Evaluation Previous Swallow Assessment: 11/09/20 BSE reg/thin Diet Prior to this Study: NPO Temperature Spikes Noted: No Respiratory Status: Room air History of Recent Intubation: No Behavior/Cognition: Alert;Cooperative;Requires cueing;Lethargic/Drowsy Oral Cavity Assessment: Within Functional Limits Oral Care Completed by SLP: Yes Oral Cavity - Dentition: Adequate natural dentition (missing some, has partials) Vision: Impaired for self-feeding Self-Feeding Abilities: Total assist Patient Positioning: Upright in bed Baseline Vocal Quality: Low vocal intensity Volitional Cough: Weak Volitional Swallow: Unable to elicit    Oral/Motor/Sensory Function Overall Oral Motor/Sensory Function: Moderate impairment Facial ROM: Reduced left Facial Symmetry: Abnormal symmetry left Facial Strength: Reduced left Lingual ROM: Within Functional Limits Lingual Symmetry: Within Functional Limits Lingual Strength: Reduced   Ice Chips Ice chips: Within functional limits Presentation: Spoon   Thin Liquid Thin Liquid: Within functional limits Presentation: Cup;Straw    Nectar Thick Nectar Thick Liquid: Not tested   Honey Thick Honey Thick Liquid: Not tested   Puree Puree: Within functional limits Presentation: Spoon   Solid     Solid: Impaired Presentation: Spoon Oral Phase Impairments: Reduced lingual movement/coordination Oral Phase Functional Implications: Prolonged oral transit     Thank you,  Genene Churn, [place]  Avila Albritton 11/18/2020,1:18 PM

## 2020-11-18 NOTE — Progress Notes (Signed)
EEG Completed; Results Pending  

## 2020-11-18 NOTE — Procedures (Signed)
Patient Name: QAIS WESEMANN  MRN: [id number]  Epilepsy Attending: Lora Havens  Referring Physician/Provider: Dr Jenetta Downer  Date: 11/18/2020 Duration: 23.06 mins  Patient history: 87yo m with reports of confusion, staring spells. EEG to evaluate for seizure  Level of alertness: Awake  AEDs during EEG study: None  Technical aspects: This EEG study was done with scalp electrodes positioned according to the 10-20 International system of electrode placement. Electrical activity was acquired at a sampling rate of '500Hz'$  and reviewed with a high frequency filter of '70Hz'$  and a low frequency filter of '1Hz'$ . EEG data were recorded continuously and digitally stored.   Description: The posterior dominant rhythm consists of 7.5 Hz activity of moderate voltage (25-35 uV) seen predominantly in posterior head regions, symmetric and reactive to eye opening and eye closing. Hyperventilation and photic stimulation were not performed.     IMPRESSION: This study is within normal limits. No seizures or epileptiform discharges were seen throughout the recording.  Alyne Martinson Barbra Sarks

## 2020-11-18 NOTE — Plan of Care (Signed)
  Problem: Acute Rehab PT Goals(only PT should resolve) Goal: Pt Will Go Supine/Side To Sit Outcome: Progressing Flowsheets (Taken 11/18/2020 1502) Pt will go Supine/Side to Sit: with minimal assist Goal: Patient Will Transfer Sit To/From Stand Outcome: Progressing Flowsheets (Taken 11/18/2020 1502) Patient will transfer sit to/from stand: with minimal assist Goal: Pt Will Transfer Bed To Chair/Chair To Bed Outcome: Progressing Flowsheets (Taken 11/18/2020 1502) Pt will Transfer Bed to Chair/Chair to Bed: with min assist Goal: Pt Will Ambulate Outcome: Progressing Flowsheets (Taken 11/18/2020 1502) Pt will Ambulate:  with minimal assist  with moderate assist  with rolling walker  25 feet   3:03 PM, 11/18/20 Lonell Grandchild, MPT Physical Therapist with [facility] [phone] office [phone] mobile phone

## 2020-11-18 NOTE — Progress Notes (Signed)
PROGRESS NOTE    Eduardo Vasquez  [id number] DOB: 05-25-1929 DOA: 11/17/2020 PCP: Rosita Fire, MD   Brief Narrative:  87 y.o. male with medical history significant for CVA, dementia, HTN.  Brought to the ED reports of generalized weakness and ambulatory difficulty.  Recently admitted 8/20 7-10/2027 for rash right-sided basal infarct status post tPA but not a good candidate for long-term anticoagulation due to microhemorrhages on MRI.  Upon admission CT head was negative, neurology was consulted who recommended MRI and EEG.   Assessment & Plan:   Principal Problem:   Generalized weakness Active Problems:   Stroke determined by clinical assessment (HCC)   Generalized weakness- with poor oral intake, cough and problems with swallowing.  Initial reports of confusion, staring spells raising concern for stroke, seizures.  CT without Acute abnormality. -Neurology recommended MRI, EEG.  MRI positive for recurrent CVA.  EEG negative. - PT/OT - Speech and swallow - UA- neg - Chest x-ray - TSH, B12, folate-Home normal   Hypokalemia-replete as needed   Recent stroke, right basal ganglia--Plavix and statin   HTN-on Norvasc  Due to recurrent CVA, palliative care team consulted.   DVT prophylaxis: enoxaparin (LOVENOX) injection 40 mg Start: 11/17/20 2100 Code Status: Full code Family Communication: Both daughters at bedside, spoke with patient's granddaughter over the phone as well.   Dispo: The patient is from: Home              Anticipated d/c is to: Home              Patient currently is not medically stable to d/c.  Ongoing evaluation by neurology for recurrent CVA   Difficult to place patient No       Subjective: Seen and examined at bedside, overall feels weak and is progressively worsened over the course of last few weeks at home.  Review of Systems Otherwise negative except as per HPI, including: General: Denies fever, chills, night sweats or unintended weight  loss. Resp: Denies cough, wheezing, shortness of breath. Cardiac: Denies chest pain, palpitations, orthopnea, paroxysmal nocturnal dyspnea. GI: Denies abdominal pain, nausea, vomiting, diarrhea or constipation GU: Denies dysuria, frequency, hesitancy or incontinence MS: Denies muscle aches, joint pain or swelling Neuro: Denies headache, neurologic deficits (focal weakness, numbness, tingling), abnormal gait Psych: Denies anxiety, depression, SI/HI/AVH Skin: Denies new rashes or lesions ID: Denies sick contacts, exotic exposures, travel  Examination:  General exam: Appears calm and comfortable  Respiratory system: Clear to auscultation. Respiratory effort normal. Cardiovascular system: S1 & S2 heard, RRR. No JVD, murmurs, rubs, gallops or clicks. No pedal edema. Gastrointestinal system: Abdomen is nondistended, soft and nontender. No organomegaly or masses felt. Normal bowel sounds heard. Central nervous system: Alert to name only but drowsy after getting Ativan for MRI.  Left side of his upper and lower extremity weaker than right. Extremities: Left upper and lower extremity weaker than right. Skin: No rashes, lesions or ulcers Psychiatry: Poor judgment and insight    Objective: Vitals:   11/17/20 1730 11/17/20 2013 11/17/20 2357 11/18/20 0549  BP: (!) 178/83 (!) 179/89 (!) 176/85 (!) 159/83  Pulse: 76 70 73 80  Resp: '16 17 18 18  '$ Temp:  98.2 F (36.8 C) 98 F (36.7 C) 98 F (36.7 C)  TempSrc:  Oral Oral Oral  SpO2:  100% 99% 100%  Weight:      Height:       No intake or output data in the 24 hours ending 11/18/20 [id number] [employer]  11/17/20 1332  Weight: 99.3 kg     Data Reviewed:   CBC: Recent Labs  Lab 11/11/20 2239 11/17/20 1336 11/18/20 0515  WBC 7.0 6.7 8.1  NEUTROABS 4.2 4.2  --   HGB 11.6* 11.7* 11.3*  HCT 35.7* 35.9* 34.8*  MCV 85.4 84.5 84.7  PLT 192 223 [id number]   Basic Metabolic Panel: Recent Labs  Lab 11/11/20 2239 11/17/20 1336  11/17/20 1837 11/18/20 0515  NA 138 138  --  140  K 3.6 3.2*  --  3.1*  CL 108 108  --  109  CO2 23 24  --  21*  GLUCOSE 112* 110*  --  132*  BUN 25* 11  --  10  CREATININE 1.71* 1.43*  --  1.26*  CALCIUM 8.5* 8.4*  --  8.4*  MG  --   --  2.0  --    GFR: Estimated Creatinine Clearance: 49.5 mL/min (A) (by C-G formula based on SCr of 1.26 mg/dL (H)). Liver Function Tests: Recent Labs  Lab 11/11/20 2239 11/17/20 1336  AST 27 26  ALT 18 20  ALKPHOS 68 82  BILITOT 0.4 0.8  PROT 7.1 7.3  ALBUMIN 3.3* 3.3*   No results for input(s): LIPASE, AMYLASE in the last 168 hours. No results for input(s): AMMONIA in the last 168 hours. Coagulation Profile: No results for input(s): INR, PROTIME in the last 168 hours. Cardiac Enzymes: No results for input(s): CKTOTAL, CKMB, CKMBINDEX, TROPONINI in the last 168 hours. BNP (last 3 results) No results for input(s): PROBNP in the last 8760 hours. HbA1C: No results for input(s): HGBA1C in the last 72 hours. CBG: No results for input(s): GLUCAP in the last 168 hours. Lipid Profile: No results for input(s): CHOL, HDL, LDLCALC, TRIG, CHOLHDL, LDLDIRECT in the last 72 hours. Thyroid Function Tests: Recent Labs    11/17/20 1837  TSH 2.623   Anemia Panel: Recent Labs    11/18/20 0515  VITAMINB12 912  FOLATE 55.5   Sepsis Labs: No results for input(s): PROCALCITON, LATICACIDVEN in the last 168 hours.  Recent Results (from the past 240 hour(s))  SARS CORONAVIRUS 2 (TAT 6-24 HRS) Nasopharyngeal Nasopharyngeal Swab     Status: None   Collection Time: 11/17/20  5:20 PM   Specimen: Nasopharyngeal Swab  Result Value Ref Range Status   SARS Coronavirus 2 NEGATIVE NEGATIVE Final    Comment: (NOTE) SARS-CoV-2 target nucleic acids are NOT DETECTED.  The SARS-CoV-2 RNA is generally detectable in upper and lower respiratory specimens during the acute phase of infection. Negative results do not preclude SARS-CoV-2 infection, do not rule  out co-infections with other pathogens, and should not be used as the sole basis for treatment or other patient management decisions. Negative results must be combined with clinical observations, patient history, and epidemiological information. The expected result is Negative.  Fact Sheet for Patients: SugarRoll.be  Fact Sheet for Healthcare Providers: https://www.woods-mathews.com/  This test is not yet approved or cleared by the [place] FDA and  has been authorized for detection and/or diagnosis of SARS-CoV-2 by FDA under an Emergency Use Authorization (EUA). This EUA will remain  in effect (meaning this test can be used) for the duration of the COVID-19 declaration under Se ction 564(b)(1) of the Act, 21 U.S.C. section 360bbb-3(b)(1), unless the authorization is terminated or revoked sooner.  Performed at [facility], [place] [address], [place], [place] [address]          Radiology Studies: CT HEAD WO CONTRAST (  5MM)  Result Date: 11/17/2020 CLINICAL DATA:  Mental status change, unknown cause EXAM: CT HEAD WITHOUT CONTRAST TECHNIQUE: Contiguous axial images were obtained from the base of the skull through the vertex without intravenous contrast. COMPARISON:  11/11/2020 FINDINGS: Brain: No evidence of acute infarction, hemorrhage, hydrocephalus, extra-axial collection or mass lesion/mass effect. Moderate low-density changes within the periventricular and subcortical white matter compatible with chronic microvascular ischemic change. Mild diffuse cerebral volume loss. Vascular: Atherosclerotic calcifications involving the large vessels of the skull base. No unexpected hyperdense vessel. Skull: Normal. Negative for fracture or focal lesion. Sinuses/Orbits: No acute finding. Other: None. IMPRESSION: 1. No acute intracranial findings. 2. Chronic microvascular ischemic change and cerebral volume loss. Electronically Signed   By: Davina Poke D.O.   On: 11/17/2020 15:43   DG CHEST PORT 1 VIEW  Result Date: 11/17/2020 CLINICAL DATA:  Increased weakness EXAM: PORTABLE CHEST 1 VIEW COMPARISON:  11/07/2020 FINDINGS: The heart size and mediastinal contours are within normal limits. Both lungs are clear. The visualized skeletal structures are unremarkable. IMPRESSION: No active disease. Electronically Signed   By: Donavan Foil M.D.   On: 11/17/2020 18:02        Scheduled Meds:  amLODipine  10 mg Oral Daily   atorvastatin  40 mg Oral Daily   clopidogrel  75 mg Oral Daily   donepezil  10 mg Oral QHS   enoxaparin (LOVENOX) injection  40 mg Subcutaneous Q24H   levothyroxine  75 mcg Oral Daily   LORazepam  0.5 mg Intravenous Once   memantine  10 mg Oral BID   tamsulosin  0.4 mg Oral Daily   Continuous Infusions:  dextrose 5 % and 0.9 % NaCl with KCl 40 mEq/L 75 mL/hr at 11/17/20 2346     LOS: 0 days   Time spent= 35 mins    Shadee Montoya Arsenio Loader, MD [facility]  If 7PM-7AM, please contact night-coverage  11/18/2020, 8:21 AM

## 2020-11-19 ENCOUNTER — Encounter (HOSPITAL_COMMUNITY): Payer: Self-pay | Admitting: Internal Medicine

## 2020-11-19 DIAGNOSIS — I639 Cerebral infarction, unspecified: Secondary | ICD-10-CM | POA: Diagnosis not present

## 2020-11-19 DIAGNOSIS — Z515 Encounter for palliative care: Secondary | ICD-10-CM | POA: Diagnosis not present

## 2020-11-19 DIAGNOSIS — R531 Weakness: Secondary | ICD-10-CM | POA: Diagnosis not present

## 2020-11-19 DIAGNOSIS — Z7189 Other specified counseling: Secondary | ICD-10-CM | POA: Diagnosis not present

## 2020-11-19 LAB — GLUCOSE, CAPILLARY
Glucose-Capillary: 100 mg/dL — ABNORMAL HIGH (ref 70–99)
Glucose-Capillary: 89 mg/dL (ref 70–99)

## 2020-11-19 LAB — MAGNESIUM: Magnesium: 1.8 mg/dL (ref 1.7–2.4)

## 2020-11-19 LAB — BASIC METABOLIC PANEL
Anion gap: 5 (ref 5–15)
BUN: 9 mg/dL (ref 8–23)
CO2: 23 mmol/L (ref 22–32)
Calcium: 8 mg/dL — ABNORMAL LOW (ref 8.9–10.3)
Chloride: 108 mmol/L (ref 98–111)
Creatinine, Ser: 1.33 mg/dL — ABNORMAL HIGH (ref 0.61–1.24)
GFR, Estimated: 51 mL/min — ABNORMAL LOW (ref >60)
Glucose, Bld: 92 mg/dL (ref 70–99)
Potassium: 3.1 mmol/L — ABNORMAL LOW (ref 3.5–5.1)
Sodium: 136 mmol/L (ref 135–145)

## 2020-11-19 MED ORDER — POTASSIUM CHLORIDE 20 MEQ PO PACK
40.0000 meq | PACK | Freq: Once | ORAL | Status: AC
  Administered 2020-11-19: 40 meq via ORAL
  Filled 2020-11-19: qty 2

## 2020-11-19 MED ORDER — HALOPERIDOL LACTATE 5 MG/ML IJ SOLN: 2.0000 mg | Freq: Four times a day (QID) | INTRAMUSCULAR | Status: AC | PRN

## 2020-11-19 NOTE — Plan of Care (Signed)
  Problem: Clinical Measurements: Goal: Ability to maintain clinical measurements within normal limits will improve Outcome: Progressing Goal: Will remain free from infection Outcome: Progressing Goal: Diagnostic test results will improve Outcome: Progressing Goal: Respiratory complications will improve Outcome: Progressing Goal: Cardiovascular complication will be avoided Outcome: Progressing   Problem: Nutrition: Goal: Adequate nutrition will be maintained Outcome: Progressing   Problem: Coping: Goal: Level of anxiety will decrease Outcome: Progressing   Problem: Elimination: Goal: Will not experience complications related to bowel motility Outcome: Progressing Goal: Will not experience complications related to urinary retention Outcome: Progressing   Problem: Pain Managment: Goal: General experience of comfort will improve Outcome: Progressing   Problem: Safety: Goal: Ability to remain free from injury will improve Outcome: Progressing   Problem: Skin Integrity: Goal: Risk for impaired skin integrity will decrease Outcome: Progressing   Problem: Safety: Goal: Non-violent Restraint(s) Outcome: Progressing   Problem: Nutrition: Goal: Risk of aspiration will decrease Outcome: Progressing

## 2020-11-19 NOTE — Consult Note (Signed)
Consultation Note Date: 11/19/2020   Patient Name: Eduardo Vasquez  DOB: 1929-07-04  MRN: [id number]  Age / Sex: 87 y.o., male  PCP: Rosita Fire, MD Referring Physician: Damita Lack, MD  Reason for Consultation: Establishing goals of care and Hospice Evaluation  HPI/Patient Profile: 87 y.o. male  with past medical history of  CVA, dementia, HTN/HLD, prostate cancer admitted on 11/17/2020 with generalized weakness due to recurrent acute CVA.   Clinical Assessment and Goals of Care: I have reviewed medical records including EPIC notes, labs and imaging, received report from RN, assessed the patient and then met at the bedside along with daughter, Donald Memoli, to discuss diagnosis prognosis, GOC, EOL wishes, disposition and options.  I introduced Palliative Medicine as specialized medical care for people living with serious illness. It focuses on providing relief from the symptoms and stress of a serious illness. The goal is to improve quality of life for both the patient and the family.  We discussed a brief life review of the patient.  Mr. Corbit worked at [employer], [place].  His wife died in 52.  His daughter, Jaymon Dudek, lives in his home.  His daughter, Federico Flake live in [place] along with Sandra's daughter Nolon Rod who is a medical doctor and primary care.   We then focused on their current illness.  [name] is knowledgeable about Mr. Wiegand acute and chronic health concerns.  We talked in detail about neurology consult, primary care, PT evaluation and recommendations, time for outcomes.   Advanced directives, concepts specific to code status, artifical feeding and hydration, and rehospitalization were considered and discussed.  We talked about the concept of "treat the treatable, but allowing natural passing".  [name] shares that she will speak with her sister and her sister's 3  daughters about forces.  [name] shares that she will speak with her sister about home with home health versus short-term rehab.  We talked about the risks for illness and injury for those with memory loss and new environments such as short-term rehab.  Discussed the importance of continued conversation with family and the medical providers regarding overall plan of care and treatment options, ensuring decisions are within the context of the patient's values and GOCs.  Questions and concerns were addressed. The family was encouraged to call with questions or concerns.  PMT will continue to support holistically.  Conference with attending, bedside nursing staff, transition of care team related to patient condition, needs, goals of care   HCPOA   NEXT OF KIN -daughter, Indie Nickerson, tells me that she and her sister make choices as a team.    SUMMARY OF RECOMMENDATIONS   At this point continue to treat the treatable Time for outcomes Agreeable to short-term rehab bed search.   Code Status/Advance Care Planning: Full code -we talked about the concept of "treat the treatable, but allowing natural passing".  [name] shares that she will discuss with her sister.  Symptom Management:  Per hospitalist, no additional needs at this time.  Palliative Prophylaxis:  Frequent Pain Assessment, Oral Care, and Turn Reposition  Additional Recommendations (Limitations, Scope, Preferences): Full Scope Treatment  Psycho-social/Spiritual:  Desire for further Chaplaincy support:no Additional Recommendations: Caregiving  Support/Resources and Education on Hospice  Prognosis:  Unable to determine, based on outcomes.  6 months or less would not be surprising based on recurrent stroke, advanced age, declining functional status.  Discharge Planning:  At this point, agreeable to short-term rehab, bed search pending.       Primary Diagnoses: Present on Admission:  Stroke determined by clinical assessment  ([place])  Encephalopathy   I have reviewed the medical record, interviewed the patient and family, and examined the patient. The following aspects are pertinent.  Past Medical History:  Diagnosis Date   Cancer [facility]) 2009   Prostate   Carotid artery occlusion    Dementia ([place])    Hypercholesteremia    Hypertension    Stroke [facility]) 2003   Mini - Left Eye- Partial Blindness   Urgency incontinence    Social History   Socioeconomic History   Marital status: Widowed    Spouse name: Not on file   Number of children: 2   Years of education: 60   Highest education level: Not on file  Occupational History   Occupation: retired    Comment: [employer]  Tobacco Use   Smoking status: Never   Smokeless tobacco: Never  Substance and Sexual Activity   Alcohol use: No    Alcohol/week: 0.0 standard drinks    Comment: 27 years alcohol free in AA   Drug use: No   Sexual activity: Yes    Birth control/protection: None  Other Topics Concern   Not on file  Social History Narrative   01/26/16 daughter stayting with patient for now   No caffeine   Social Determinants of Health   Financial Resource Strain: Not on file  Food Insecurity: Not on file  Transportation Needs: Not on file  Physical Activity: Not on file  Stress: Not on file  Social Connections: Not on file   Family History  Problem Relation Age of Onset   Hypertension Mother    Hyperlipidemia Mother    Hypertension Father    Hyperlipidemia Father    Diabetes Daughter    Prostate cancer Brother    Hypertension Brother    Scheduled Meds:  amLODipine  10 mg Oral Daily   atorvastatin  40 mg Oral Daily   clopidogrel  75 mg Oral Daily   donepezil  10 mg Oral QHS   enoxaparin (LOVENOX) injection  40 mg Subcutaneous Q24H   levothyroxine  75 mcg Oral Daily   memantine  10 mg Oral BID   tamsulosin  0.4 mg Oral Daily   Continuous Infusions: PRN Meds:.acetaminophen **OR** acetaminophen, ondansetron **OR** ondansetron  (ZOFRAN) IV, polyethylene glycol Medications Prior to Admission:  Prior to Admission medications   Medication Sig Start Date End Date Taking? Authorizing Provider  amLODipine (NORVASC) 10 MG tablet Take 10 mg by mouth daily. 10/12/15  Yes [provider]  atorvastatin (LIPITOR) 40 MG tablet Take 1 tablet (40 mg total) by mouth daily. 11/10/20  Yes Einar Pheasant, NP  clopidogrel (PLAVIX) 75 MG tablet Take 1 tablet (75 mg total) by mouth daily. 11/10/20  Yes Heard, Demetrius Charity, NP  donepezil (ARICEPT) 10 MG tablet TAKE 1 TABLET(10 MG) BY MOUTH AT BEDTIME Patient taking differently: Take 10 mg by mouth at bedtime. 02/27/17  Yes Penumalli, Earlean Polka, MD  doxazosin (CARDURA) 4 MG tablet Take  4 mg by mouth daily.   Yes [provider]  levothyroxine (SYNTHROID) 75 MCG tablet Take 75 mcg by mouth daily. 10/22/20  Yes [provider]  memantine (NAMENDA) 10 MG tablet Take 1 tablet (10 mg total) by mouth 2 (two) times daily. 01/26/16  Yes Penumalli, Earlean Polka, MD  Multiple Vitamin (MULTIVITAMIN) tablet Take 1 tablet by mouth daily.   Yes [provider]  Tamsulosin HCl (FLOMAX) 0.4 MG CAPS Take 0.4 mg by mouth daily.   Yes [provider]  LUMIGAN 0.01 % SOLN Place 1 drop into both eyes daily as needed (dry eyes). As needed Patient not taking: No sig reported 12/17/15   [provider]   No Known Allergies Review of Systems  Unable to perform ROS: Mental status change   Physical Exam Vitals and nursing note reviewed.  Constitutional:      General: He is not in acute distress.    Appearance: He is not ill-appearing.  Cardiovascular:     Rate and Rhythm: Normal rate.  Pulmonary:     Effort: Pulmonary effort is normal. No respiratory distress.  Skin:    General: Skin is warm and dry.  Neurological:     Mental Status: He is alert.     Comments: Does not answer orientation questions   Psychiatric:     Comments: Calm     Vital Signs: BP (!)  158/78   Pulse 64   Temp 98.8 F (37.1 C)   Resp 17   Ht 6' (1.829 m)   Wt 99.3 kg   SpO2 96%   BMI 29.70 kg/m  Pain Scale: 0-10   Pain Score: 0-No pain   SpO2: SpO2: 96 % O2 Device:SpO2: 96 % O2 Flow Rate: .   IO: Intake/output summary:  Intake/Output Summary (Last 24 hours) at 11/19/2020 1211 Last data filed at 11/19/2020 0900 Gross per 24 hour  Intake 120 ml  Output --  Net 120 ml    LBM:   Baseline Weight: Weight: 99.3 kg Most recent weight: Weight: 99.3 kg     Palliative Assessment/Data:   Flowsheet Rows    Flowsheet Row Most Recent Value  Intake Tab   Referral Department Hospitalist  Unit at Time of Referral Cardiac/Telemetry Unit  Palliative Care Primary Diagnosis Neurology  Date Notified 11/18/20  Palliative Care Type New Palliative care  Reason for referral Clarify Goals of Care  Date first seen by Palliative Care 11/19/20  # of days Palliative referral response time 1 Day(s)  Clinical Assessment   Palliative Performance Scale Score 30%  Pain Max last 24 hours Not able to report  Pain Min Last 24 hours Not able to report  Dyspnea Max Last 24 Hours Not able to report  Dyspnea Min Last 24 hours Not able to report  Psychosocial & Spiritual Assessment   Palliative Care Outcomes        Time In: 1100 Time Out: 1210 Time Total: 70 minutes  Greater than 50%  of this time was spent counseling and coordinating care related to the above assessment and plan.  Signed by: Drue Novel, NP   Please contact Palliative Medicine Team phone at [phone] for questions and concerns.  For individual provider: See Shea Evans

## 2020-11-19 NOTE — Plan of Care (Signed)
Briefly discussed patient with Dr. Reesa Chew, who notes that unfortunately Dr. Merlene Laughter is unavailable at this time at [facility], question was whether patient requires further workup or inpatient neurology evaluation at this time.   He was recently admitted 8/27 after receiving tPA for an acute stroke, discharged on 8/29.  Etiology was felt to be cryptogenic, likely cardioembolic but risks of anticoagulation were felt to outweigh benefits given dementia and microhemorrhages on brain MRI concerning for possible CAA.  Per discharge summary: "MRI Brain showed an acute right basal ganglia/corona radiata stroke + evidence of prior SAH and underlying chronic microhemorrhages in the posterior right cerebral hemisphere. Vessel imaging was done with CUS and TCD. Bilateral CUS showed 1-39 % stenosis in the right and left ICA. TCD study was suboptimal. Echo w/EF 55 to 60 % and LA normal in size. Stroke labs w/hemoglobin a1c 6.4, LDL 82. Stroke etiology is cryptogenic, likely cardioembolic. Further cardiac work up with cardiac event monitor deferred given he is not a good candidate for anticoagulation due to dementia and microhemorrhages noted on MRI Brain. At discharge will continue Plavix 75 mg QD and Atorvastatin 40 mg QD for stroke prevention- was taking Aspirin at home thus was switched to Plavix this admission. Follow up in stroke clinic at discharge. PT/OT saw him this admission and recommended home health PT/OT."  Unfortunately he continued to have some intermittent symptoms at home as well as difficulty with ambulation and was readmitted.  MRI brain was completed as was EEG  Data:  MRI brain and MRA brain personally reviewed agree with radiology read with additional thoughts below: IMPRESSION: 1. Recurrent acute ischemia in the right basal ganglia. 2. Severe chronic small vessel ischemic disease. 3. No major intracranial arterial occlusion or high-grade proximal stenosis.  Notably SWI sequencing  was more motion limited than MRI brain completed on August 28 however within these limits does not show any significant new hemorrhage  EEG was within normal limits  Remainder of labs are reassuring as below with mild hypokalemia, mild stable hyperglycemia, improving renal function since last discharge, and stable mild anemia (normocytic with elevated RDW)   Basic Metabolic Panel: Recent Labs  Lab 11/17/20 1336 11/17/20 1837 11/18/20 0515  NA 138  --  140  K 3.2*  --  3.1*  CL 108  --  109  CO2 24  --  21*  GLUCOSE 110*  --  132*  BUN 11  --  10  CREATININE 1.43*  --  1.26*  CALCIUM 8.4*  --  8.4*  MG  --  2.0  --     CBC: Recent Labs  Lab 11/17/20 1336 11/18/20 0515  WBC 6.7 8.1  NEUTROABS 4.2  --   HGB 11.7* 11.3*  HCT 35.9* 34.8*  MCV 84.5 84.7  PLT 223 215    He has been hypertensive with blood pressures ranging from 150s-190s/70s-80s  PT has evaluated the patient and is recommending skilled nursing facility   Assessment: Based on the imaging, I suspect that his stroke is behaving more as a stuttering lacunar stroke than an embolic phenomena, though he does not have significant intracranial atherosclerosis on MRA.  However the majority of the benefit of dual antiplatelet therapy is seen within the first 1 week when looking carefully at CHANCE/POINT trials, and therefore I do not see significant benefit in adding a second antiplatelet agent at this time especially given the risks for this patient due to concern for CAA.  Given he very recently had  a complete stroke work-up, do not feel that there is benefit in repeating any of the other standard work-up.  Recommendations: -Continue stroke secondary prevention as previously recommended (atorvastatin 40 mg daily, Plavix 75 mg daily) -No indication for addition of antiseizure medication at this time -No further inpatient neurological work-up needed -Long-term would gradually normalize blood pressure -Close outpatient  follow-up with neurology  Lesleigh Noe MD-PhD [facility] [phone]  Available 8 AM to 8 PM for brief curbsides, which do not replace a full neurology consult

## 2020-11-19 NOTE — TOC Initial Note (Signed)
Transition of Care [facility]) - Initial/Assessment Note    Patient Details  Name: Eduardo Vasquez MRN: [id number] Date of Birth: [birth year]  Transition of [facility]) CM/SW Contact:    Salome Arnt, [place] Phone Number: 11/19/2020, 11:22 AM  Clinical Narrative:  Pt admitted due to generalized weakness due to stroke. Oriented to self only per chart. Pt's daughter, Eduardo Vasquez reports she lives with pt and is with pt around the clock. She indicates pt was doing well until he fell in the bathroom last weekend. At baseline, he ambulates independently. Discussed PT evaluation recommending SNF. Eduardo Vasquez is agreeable, requesting [facility]. CMA will start authorization. TOC will follow up with bed offers.                   Expected Discharge Plan: Skilled Nursing Facility Barriers to Discharge: Continued Medical Work up, SNF Pending bed offer, Insurance Authorization   Patient Goals and CMS Choice Patient states their goals for this hospitalization and ongoing recovery are:: SNF   Choice offered to / list presented to : Adult Children  Expected Discharge Plan and Services Expected Discharge Plan: [place] In-house Referral: Clinical Social Work   Post Acute Care Choice: [place] Living arrangements for the past 2 months: [place]                 DME Arranged: N/A                    Prior Living Arrangements/Services Living arrangements for the past 2 months: Single Family Home Lives with:: Adult Children Patient language and need for interpreter reviewed:: Yes Do you feel safe going back to the place where you live?: Yes      Need for Family Participation in Patient Care: Yes (Comment) Care giver support system in place?: Yes (comment) Current home services: DME, Home PT (walker, cane, raised toilet seat) Criminal Activity/Legal Involvement Pertinent to Current Situation/Hospitalization: No - Comment as needed  Activities of Daily  Living Home Assistive Devices/Equipment: None ADL Screening (condition at time of admission) Patient's cognitive ability adequate to safely complete daily activities?: Yes Is the patient deaf or have difficulty hearing?: No Does the patient have difficulty seeing, even when wearing glasses/contacts?: No Does the patient have difficulty concentrating, remembering, or making decisions?: No Patient able to express need for assistance with ADLs?: Yes Does the patient have difficulty dressing or bathing?: No Independently performs ADLs?: Yes (appropriate for developmental age) Does the patient have difficulty walking or climbing stairs?: No Weakness of Legs: None Weakness of Arms/Hands: None  Permission Sought/Granted                  Emotional Assessment   Attitude/Demeanor/Rapport: Unable to Assess Affect (typically observed): Unable to Assess Orientation: : Oriented to Self Alcohol / Substance Use: Not Applicable Psych Involvement: No (comment)  Admission diagnosis:  Cough [R05.9] Weakness [R53.1] Encephalopathy [G93.40] Patient Active Problem List   Diagnosis Date Noted   Encephalopathy 11/18/2020   Generalized weakness 11/17/2020   Stroke determined by clinical assessment ([place]) 11/07/2020   Acute right MCA stroke ([place]) 11/07/2020   Occlusion and stenosis of carotid artery without mention of cerebral infarction 11/03/2011   PCP:  Rosita Fire, MD Pharmacy:   [facility] [address] [place] [address] Phone: [phone] Fax: [phone]     Social  Determinants of Health (SDOH) Interventions    Readmission Risk Interventions No flowsheet data found.

## 2020-11-19 NOTE — NC FL2 (Signed)
[place] MEDICAID FL2 LEVEL OF CARE SCREENING TOOL     IDENTIFICATION  Patient Name: Eduardo Vasquez Birthdate: June 22, 1929 Sex: male Admission Date (Current Location): 11/17/2020  [facility] and [place] Number:  [employer] and Address:  [facility] [address], [place]      Provider Number: [id number]  Attending Physician Name and Address:  Damita Lack, MD  Relative Name and Phone Number:       Current Level of Care: Hospital Recommended Level of Care: [place] Prior Approval Number:    Date Approved/Denied:   PASRR Number: [id number]  Discharge Plan: SNF    Current Diagnoses: Patient Active Problem List   Diagnosis Date Noted   Encephalopathy 11/18/2020   Generalized weakness 11/17/2020   Stroke determined by clinical assessment ([place]) 11/07/2020   Acute right MCA stroke ([place]) 11/07/2020   Occlusion and stenosis of carotid artery without mention of cerebral infarction 11/03/2011    Orientation RESPIRATION BLADDER Height & Weight     Self  Normal External catheter Weight: 219 lb (99.3 kg) Height:  6' (182.9 cm)  BEHAVIORAL SYMPTOMS/MOOD NEUROLOGICAL BOWEL NUTRITION STATUS      Incontinent Diet (Dysphagia 2 with thin liquids)  AMBULATORY STATUS COMMUNICATION OF NEEDS Skin   Extensive Assist Verbally  (skin tears)                       Personal Care Assistance Level of Assistance  Bathing, Dressing, Feeding Bathing Assistance: Maximum assistance Feeding assistance: Limited assistance Dressing Assistance: Maximum assistance     Functional Limitations Info  Sight, Hearing, Speech Sight Info: Impaired Hearing Info: Adequate Speech Info: Adequate    SPECIAL CARE FACTORS FREQUENCY  PT (By licensed PT)     PT Frequency: 5x weekly              Contractures      Additional Factors Info  Code Status, Allergies, Psychotropic Code Status Info: Full code Allergies Info: No known  allergies Psychotropic Info: Aricept, Namenda         Current Medications (11/19/2020):  This is the current hospital active medication list Current Facility-Administered Medications  Medication Dose Route Frequency Provider Last Rate Last Admin   acetaminophen (TYLENOL) tablet 650 mg  650 mg Oral Q6H PRN Emokpae, Ejiroghene E, MD       Or   acetaminophen (TYLENOL) suppository 650 mg  650 mg Rectal Q6H PRN Emokpae, Ejiroghene E, MD       amLODipine (NORVASC) tablet 10 mg  10 mg Oral Daily Emokpae, Ejiroghene E, MD   10 mg at 11/19/20 1038   atorvastatin (LIPITOR) tablet 40 mg  40 mg Oral Daily Emokpae, Ejiroghene E, MD   40 mg at 11/19/20 1039   clopidogrel (PLAVIX) tablet 75 mg  75 mg Oral Daily Emokpae, Ejiroghene E, MD   75 mg at 11/19/20 1039   donepezil (ARICEPT) tablet 10 mg  10 mg Oral QHS Emokpae, Ejiroghene E, MD   10 mg at 11/17/20 2207   enoxaparin (LOVENOX) injection 40 mg  40 mg Subcutaneous Q24H Emokpae, Ejiroghene E, MD   40 mg at 11/17/20 2208   levothyroxine (SYNTHROID) tablet 75 mcg  75 mcg Oral Daily Emokpae, Ejiroghene E, MD   75 mcg at 11/19/20 0655   memantine (NAMENDA) tablet 10 mg  10 mg Oral BID Emokpae, Ejiroghene E, MD   10 mg at 11/19/20 1040   ondansetron (ZOFRAN) tablet 4 mg  4 mg Oral Q6H PRN Emokpae, Ejiroghene E, MD       Or   ondansetron (ZOFRAN) injection 4 mg  4 mg Intravenous Q6H PRN Emokpae, Ejiroghene E, MD       polyethylene glycol (MIRALAX / GLYCOLAX) packet 17 g  17 g Oral Daily PRN Emokpae, Ejiroghene E, MD       tamsulosin (FLOMAX) capsule 0.4 mg  0.4 mg Oral Daily Emokpae, Ejiroghene E, MD   0.4 mg at 11/19/20 1041     Discharge Medications: Please see discharge summary for a list of discharge medications.  Relevant Imaging Results:  Relevant Lab Results:   Additional Information SSN: [id number]. Pfizer vaccines 05/05/19, 06/02/19, 01/25/20.  Salome Arnt, LCSW

## 2020-11-19 NOTE — Progress Notes (Signed)
  Speech Language Pathology Treatment: Dysphagia  Patient Details Name: Eduardo Vasquez MRN: [id number] DOB: 09-30-1929 Today's Date: 11/19/2020 Time: [id number] SLP Time Calculation (min) (ACUTE ONLY): 12 min  Assessment / Plan / Recommendation Clinical Impression  Pt with improved alertness this date, continued mild/mod left facial asymmetry. He was able to self present straw sips of tea and water. No overt signs of reduced airway protection, although mild wet vocal quality prior to liquids (cleared). Continue diet as ordered and SLP will continue to follow.    HPI HPI: 87 y.o. male with medical history significant for CVA, dementia, HTN.  Brought to the ED reports of generalized weakness and ambulatory difficulty.  Recently admitted 8/20 7-10/2027 for rash right-sided basal infarct status post tPA but not a good candidate for long-term anticoagulation due to microhemorrhages on MRI.  Upon admission CT head was negative, neurology was consulted who recommended MRI and EEG. Pt with  with poor oral intake, cough and problems with swallowing.  Initial reports of confusion, staring spells raising concern for stroke, seizures.  CT without Acute abnormality. BSE requested. He had a BSE at [facility] [address] on 8/29 during recent admission with recommendation for regular textures and thin liquis.      SLP Plan  Continue with current plan of care       Recommendations  Diet recommendations: Dysphagia 2 (fine chop);Thin liquid Liquids provided via: Cup;Straw Medication Administration: Crushed with puree Supervision: Patient able to self feed;Intermittent supervision to cue for compensatory strategies Compensations: Minimize environmental distractions;Slow rate;Small sips/bites Postural Changes and/or Swallow Maneuvers: Seated upright 90 degrees;Upright 30-60 min after meal                Oral Care Recommendations: Oral care BID;Staff/trained caregiver to provide oral care Follow up Recommendations:  Skilled Nursing facility SLP Visit Diagnosis: Dysphagia, unspecified (R13.10) Plan: Continue with current plan of care       Thank you,  Genene Churn, [place]                 [place] 11/19/2020, 5:14 PM

## 2020-11-19 NOTE — Progress Notes (Addendum)
PROGRESS NOTE    Eduardo Vasquez  [id number] DOB: 19-Jul-1929 DOA: 11/17/2020 PCP: Rosita Fire, MD   Brief Narrative:  87 y.o. male with medical history significant for CVA, dementia, HTN.  Brought to the ED reports of generalized weakness and ambulatory difficulty.  Recently admitted 8/20 7-10/2027 for rash right-sided basal infarct status post tPA but not a good candidate for long-term anticoagulation due to microhemorrhages on MRI.  Upon admission CT head was negative, neurology was consulted who recommended MRI and EEG.  MRI was positive for recurrent CVA, EEG was negative.  Neurology recommended continuing Plavix and statin.  PT recommended SNF.   Assessment & Plan:   Principal Problem:   Generalized weakness Active Problems:   Stroke determined by clinical assessment ([place])   Encephalopathy   Generalized weakness due to recurrent acute CVA-   CT without Acute abnormality. - Neurology-MRI which showed recurrent CVA.  EEG was negative.  Spoke with Dr. Curly Shores from neuro today morning recommending aspirin and statin - PT/OT-SNF - UA- neg - TSH, B12, folate-allormal   Hypokalemia-replete as needed   Recent stroke, right basal ganglia--Plavix and statin   HTN-on Norvasc  Due to recurrent CVA, palliative care team consulted.   DVT prophylaxis: enoxaparin (LOVENOX) injection 40 mg Start: 11/17/20 2100 Code Status: Full code Family Communication: Daughter at bedside.  Spoke with his granddaughter over the phone who is also physician.   Dispo: The patient is from: Home              Anticipated d/c is to: SNF              Patient currently is medically stable for SNF   Difficult to place patient No       Subjective: No acute events overnight.  No complaints this morning.  Ate his breakfast.  Review of Systems Otherwise negative except as per HPI, including: General: Denies fever, chills, night sweats or unintended weight loss. Resp: Denies cough, wheezing,  shortness of breath. Cardiac: Denies chest pain, palpitations, orthopnea, paroxysmal nocturnal dyspnea. GI: Denies abdominal pain, nausea, vomiting, diarrhea or constipation GU: Denies dysuria, frequency, hesitancy or incontinence MS: Denies muscle aches, joint pain or swelling Neuro: Denies headache, neurologic deficits (focal weakness, numbness, tingling), abnormal gait Psych: Denies anxiety, depression, SI/HI/AVH Skin: Denies new rashes or lesions ID: Denies sick contacts, exotic exposures, travel  Examination:  Constitutional: Not in acute distress Respiratory: Clear to auscultation bilaterally Cardiovascular: Normal sinus rhythm, no rubs Abdomen: Nontender nondistended good bowel sounds Musculoskeletal: No edema noted Skin: No rashes seen Neurologic: CN 2-12 grossly intact.  Left upper and lower extremity weakness compared to right Psychiatric: Poor judgment and insight.  Alert to name and place   Objective: Vitals:   11/18/20 1848 11/18/20 2106 11/19/20 0556 11/19/20 0752  BP: (!) 186/85 (!) 143/76 (!) 192/79 (!) 158/78  Pulse: 79 71 67 64  Resp:  18 17   Temp:  98.2 F (36.8 C) 98.8 F (37.1 C)   TempSrc:      SpO2: 100% 97% 100% 96%  Weight:      Height:       No intake or output data in the 24 hours ending 11/19/20 1112 Filed Weights   11/17/20 1332  Weight: 99.3 kg     Data Reviewed:   CBC: Recent Labs  Lab 11/17/20 1336 11/18/20 0515  WBC 6.7 8.1  NEUTROABS 4.2  --   HGB 11.7* 11.3*  HCT 35.9* 34.8*  MCV 84.5 84.7  PLT 223 [id number]   Basic Metabolic Panel: Recent Labs  Lab 11/17/20 1336 11/17/20 1837 11/18/20 0515 11/19/20 0800  NA 138  --  140 136  K 3.2*  --  3.1* 3.1*  CL 108  --  109 108  CO2 24  --  21* 23  GLUCOSE 110*  --  132* 92  BUN 11  --  10 9  CREATININE 1.43*  --  1.26* 1.33*  CALCIUM 8.4*  --  8.4* 8.0*  MG  --  2.0  --  1.8   GFR: Estimated Creatinine Clearance: 46.9 mL/min (A) (by C-G formula based on SCr of 1.33 mg/dL  (H)). Liver Function Tests: Recent Labs  Lab 11/17/20 1336  AST 26  ALT 20  ALKPHOS 82  BILITOT 0.8  PROT 7.3  ALBUMIN 3.3*   No results for input(s): LIPASE, AMYLASE in the last 168 hours. No results for input(s): AMMONIA in the last 168 hours. Coagulation Profile: No results for input(s): INR, PROTIME in the last 168 hours. Cardiac Enzymes: No results for input(s): CKTOTAL, CKMB, CKMBINDEX, TROPONINI in the last 168 hours. BNP (last 3 results) No results for input(s): PROBNP in the last 8760 hours. HbA1C: No results for input(s): HGBA1C in the last 72 hours. CBG: Recent Labs  Lab 11/18/20 1316 11/18/20 1937 11/19/20 0030  GLUCAP 112* 144* 89   Lipid Profile: No results for input(s): CHOL, HDL, LDLCALC, TRIG, CHOLHDL, LDLDIRECT in the last 72 hours. Thyroid Function Tests: Recent Labs    11/17/20 1837  TSH 2.623   Anemia Panel: Recent Labs    11/18/20 0515  VITAMINB12 912  FOLATE 55.5   Sepsis Labs: No results for input(s): PROCALCITON, LATICACIDVEN in the last 168 hours.  Recent Results (from the past 240 hour(s))  SARS CORONAVIRUS 2 (TAT 6-24 HRS) Nasopharyngeal Nasopharyngeal Swab     Status: None   Collection Time: 11/17/20  5:20 PM   Specimen: Nasopharyngeal Swab  Result Value Ref Range Status   SARS Coronavirus 2 NEGATIVE NEGATIVE Final    Comment: (NOTE) SARS-CoV-2 target nucleic acids are NOT DETECTED.  The SARS-CoV-2 RNA is generally detectable in upper and lower respiratory specimens during the acute phase of infection. Negative results do not preclude SARS-CoV-2 infection, do not rule out co-infections with other pathogens, and should not be used as the sole basis for treatment or other patient management decisions. Negative results must be combined with clinical observations, patient history, and epidemiological information. The expected result is Negative.  Fact Sheet for Patients: SugarRoll.be  Fact Sheet  for Healthcare Providers: https://www.woods-mathews.com/  This test is not yet approved or cleared by the [place] FDA and  has been authorized for detection and/or diagnosis of SARS-CoV-2 by FDA under an Emergency Use Authorization (EUA). This EUA will remain  in effect (meaning this test can be used) for the duration of the COVID-19 declaration under Se ction 564(b)(1) of the Act, 21 U.S.C. section 360bbb-3(b)(1), unless the authorization is terminated or revoked sooner.  Performed at [facility], [place] [address], [place], [place] [address]          Radiology Studies: CT HEAD WO CONTRAST (5MM)  Result Date: 11/17/2020 CLINICAL DATA:  Mental status change, unknown cause EXAM: CT HEAD WITHOUT CONTRAST TECHNIQUE: Contiguous axial images were obtained from the base of the skull through the vertex without intravenous contrast. COMPARISON:  11/11/2020 FINDINGS: Brain: No evidence of acute infarction, hemorrhage, hydrocephalus, extra-axial collection or mass lesion/mass effect. Moderate low-density changes  within the periventricular and subcortical white matter compatible with chronic microvascular ischemic change. Mild diffuse cerebral volume loss. Vascular: Atherosclerotic calcifications involving the large vessels of the skull base. No unexpected hyperdense vessel. Skull: Normal. Negative for fracture or focal lesion. Sinuses/Orbits: No acute finding. Other: None. IMPRESSION: 1. No acute intracranial findings. 2. Chronic microvascular ischemic change and cerebral volume loss. Electronically Signed   By: Davina Poke D.O.   On: 11/17/2020 15:43   MR ANGIO HEAD WO CONTRAST  Result Date: 11/18/2020 CLINICAL DATA:  Generalized weakness, unable to walk, confusion, recent CVA received IV TPA. EXAM: MRI HEAD WITHOUT CONTRAST MRA HEAD WITHOUT CONTRAST TECHNIQUE: Multiplanar, multi-echo pulse sequences of the brain and surrounding structures were acquired without intravenous  contrast. Angiographic images of the Circle of Willis were acquired using MRA technique without intravenous contrast. COMPARISON:  Head CT 11/17/2020 and MRI 11/08/2020 FINDINGS: MRI HEAD FINDINGS Brain: There is restricted diffusion involving the right basal ganglia (caudate body and lentiform nucleus) and corona radiata which is in the same location as the acute infarct on the prior MRI, however the extent of the diffusion abnormality today is greater than on the prior MRI with more intensely restricted diffusion consistent with recurrent acute ischemia. Superficial siderosis is again seen over the posterior right cerebral convexity consistent with remote subarachnoid hemorrhage with some chronic parenchymal microhemorrhages also again noted in this region. Patchy and confluent T2 hyperintensities in the cerebral white matter bilaterally are unchanged and nonspecific but compatible with severe chronic small vessel ischemic disease. Asymmetrically advanced volume loss is again noted in the mesial right temporal lobe. No mass, midline shift, or extra-axial fluid collection is identified. Vascular: Major intracranial vascular flow voids are preserved. Skull and upper cervical spine: Unremarkable bone marrow signal. Sinuses/Orbits: Unremarkable orbits. Mild mucosal thickening in the frontal and ethmoid sinuses bilaterally. Clear mastoid air cells. Other: None. MRA HEAD FINDINGS Anterior circulation: The internal carotid arteries are patent from skull base to carotid termini with at most mild supraclinoid stenosis bilaterally. ACAs and MCAs are patent without evidence of a proximal branch occlusion or significant proximal stenosis. No aneurysm is identified. Posterior circulation: The intracranial vertebral arteries are widely patent to the basilar and codominant. Patent PICA and SCA origins are seen bilaterally. The basilar artery is widely patent. Posterior communicating arteries are diminutive or absent. Both PCAs  are patent without evidence of a significant proximal stenosis. No aneurysm is identified. Anatomic variants: None. IMPRESSION: 1. Recurrent acute ischemia in the right basal ganglia. 2. Severe chronic small vessel ischemic disease. 3. No major intracranial arterial occlusion or high-grade proximal stenosis. Electronically Signed   By: Logan Bores M.D.   On: 11/18/2020 10:12   MR BRAIN WO CONTRAST  Result Date: 11/18/2020 CLINICAL DATA:  Generalized weakness, unable to walk, confusion, recent CVA received IV TPA. EXAM: MRI HEAD WITHOUT CONTRAST MRA HEAD WITHOUT CONTRAST TECHNIQUE: Multiplanar, multi-echo pulse sequences of the brain and surrounding structures were acquired without intravenous contrast. Angiographic images of the Circle of Willis were acquired using MRA technique without intravenous contrast. COMPARISON:  Head CT 11/17/2020 and MRI 11/08/2020 FINDINGS: MRI HEAD FINDINGS Brain: There is restricted diffusion involving the right basal ganglia (caudate body and lentiform nucleus) and corona radiata which is in the same location as the acute infarct on the prior MRI, however the extent of the diffusion abnormality today is greater than on the prior MRI with more intensely restricted diffusion consistent with recurrent acute ischemia. Superficial siderosis is again seen over the  posterior right cerebral convexity consistent with remote subarachnoid hemorrhage with some chronic parenchymal microhemorrhages also again noted in this region. Patchy and confluent T2 hyperintensities in the cerebral white matter bilaterally are unchanged and nonspecific but compatible with severe chronic small vessel ischemic disease. Asymmetrically advanced volume loss is again noted in the mesial right temporal lobe. No mass, midline shift, or extra-axial fluid collection is identified. Vascular: Major intracranial vascular flow voids are preserved. Skull and upper cervical spine: Unremarkable bone marrow signal.  Sinuses/Orbits: Unremarkable orbits. Mild mucosal thickening in the frontal and ethmoid sinuses bilaterally. Clear mastoid air cells. Other: None. MRA HEAD FINDINGS Anterior circulation: The internal carotid arteries are patent from skull base to carotid termini with at most mild supraclinoid stenosis bilaterally. ACAs and MCAs are patent without evidence of a proximal branch occlusion or significant proximal stenosis. No aneurysm is identified. Posterior circulation: The intracranial vertebral arteries are widely patent to the basilar and codominant. Patent PICA and SCA origins are seen bilaterally. The basilar artery is widely patent. Posterior communicating arteries are diminutive or absent. Both PCAs are patent without evidence of a significant proximal stenosis. No aneurysm is identified. Anatomic variants: None. IMPRESSION: 1. Recurrent acute ischemia in the right basal ganglia. 2. Severe chronic small vessel ischemic disease. 3. No major intracranial arterial occlusion or high-grade proximal stenosis. Electronically Signed   By: Logan Bores M.D.   On: 11/18/2020 10:12   DG CHEST PORT 1 VIEW  Result Date: 11/17/2020 CLINICAL DATA:  Increased weakness EXAM: PORTABLE CHEST 1 VIEW COMPARISON:  11/07/2020 FINDINGS: The heart size and mediastinal contours are within normal limits. Both lungs are clear. The visualized skeletal structures are unremarkable. IMPRESSION: No active disease. Electronically Signed   By: Donavan Foil M.D.   On: 11/17/2020 18:02   EEG adult  Result Date: 11/18/2020 Lora Havens, MD     11/18/2020  1:08 PM Patient Name: Eduardo Vasquez MRN: [id number] Epilepsy Attending: Lora Havens Referring Physician/Provider: Dr Jenetta Downer Date: 11/18/2020 Duration: 23.06 mins Patient history: 87yo m with reports of confusion, staring spells. EEG to evaluate for seizure Level of alertness: Awake AEDs during EEG study: None Technical aspects: This EEG study was done with scalp  electrodes positioned according to the 10-20 International system of electrode placement. Electrical activity was acquired at a sampling rate of '500Hz'$  and reviewed with a high frequency filter of '70Hz'$  and a low frequency filter of '1Hz'$ . EEG data were recorded continuously and digitally stored. Description: The posterior dominant rhythm consists of 7.5 Hz activity of moderate voltage (25-35 uV) seen predominantly in posterior head regions, symmetric and reactive to eye opening and eye closing. Hyperventilation and photic stimulation were not performed.   IMPRESSION: This study is within normal limits. No seizures or epileptiform discharges were seen throughout the recording. Priyanka Barbra Sarks        Scheduled Meds:  amLODipine  10 mg Oral Daily   atorvastatin  40 mg Oral Daily   clopidogrel  75 mg Oral Daily   donepezil  10 mg Oral QHS   enoxaparin (LOVENOX) injection  40 mg Subcutaneous Q24H   levothyroxine  75 mcg Oral Daily   memantine  10 mg Oral BID   tamsulosin  0.4 mg Oral Daily   Continuous Infusions:     LOS: 1 day   Time spent= 35 mins    Joushua Dugar Arsenio Loader, MD [facility]  If 7PM-7AM, please contact night-coverage  11/19/2020, 11:12 AM

## 2020-11-20 ENCOUNTER — Encounter (HOSPITAL_COMMUNITY): Payer: Self-pay | Admitting: Emergency Medicine

## 2020-11-20 ENCOUNTER — Emergency Department (HOSPITAL_COMMUNITY)
Admission: EM | Admit: 2020-11-20 | Discharge: 2020-11-21 | Disposition: A | Discharge status: Home / Self Care | Payer: Medicare Other | Attending: Emergency Medicine | Admitting: Emergency Medicine

## 2020-11-20 ENCOUNTER — Other Ambulatory Visit: Payer: Self-pay

## 2020-11-20 DIAGNOSIS — R791 Abnormal coagulation profile: Secondary | ICD-10-CM | POA: Diagnosis not present

## 2020-11-20 DIAGNOSIS — Z7902 Long term (current) use of antithrombotics/antiplatelets: Secondary | ICD-10-CM | POA: Insufficient documentation

## 2020-11-20 DIAGNOSIS — Z8546 Personal history of malignant neoplasm of prostate: Secondary | ICD-10-CM | POA: Insufficient documentation

## 2020-11-20 DIAGNOSIS — R4182 Altered mental status, unspecified: Secondary | ICD-10-CM | POA: Insufficient documentation

## 2020-11-20 DIAGNOSIS — Z09 Encounter for follow-up examination after completed treatment for conditions other than malignant neoplasm: Secondary | ICD-10-CM | POA: Insufficient documentation

## 2020-11-20 DIAGNOSIS — J690 Pneumonitis due to inhalation of food and vomit: Secondary | ICD-10-CM | POA: Insufficient documentation

## 2020-11-20 DIAGNOSIS — Z79899 Other long term (current) drug therapy: Secondary | ICD-10-CM | POA: Insufficient documentation

## 2020-11-20 DIAGNOSIS — I10 Essential (primary) hypertension: Secondary | ICD-10-CM | POA: Diagnosis not present

## 2020-11-20 DIAGNOSIS — R410 Disorientation, unspecified: Secondary | ICD-10-CM | POA: Diagnosis not present

## 2020-11-20 DIAGNOSIS — R531 Weakness: Secondary | ICD-10-CM | POA: Diagnosis not present

## 2020-11-20 DIAGNOSIS — I63511 Cerebral infarction due to unspecified occlusion or stenosis of right middle cerebral artery: Secondary | ICD-10-CM | POA: Diagnosis not present

## 2020-11-20 DIAGNOSIS — R059 Cough, unspecified: Secondary | ICD-10-CM | POA: Diagnosis not present

## 2020-11-20 DIAGNOSIS — F0390 Unspecified dementia without behavioral disturbance: Secondary | ICD-10-CM | POA: Insufficient documentation

## 2020-11-20 LAB — BASIC METABOLIC PANEL
Anion gap: 11 (ref 5–15)
Anion gap: 8 (ref 5–15)
BUN: 11 mg/dL (ref 8–23)
BUN: 11 mg/dL (ref 8–23)
CO2: 21 mmol/L — ABNORMAL LOW (ref 22–32)
CO2: 21 mmol/L — ABNORMAL LOW (ref 22–32)
Calcium: 8.4 mg/dL — ABNORMAL LOW (ref 8.9–10.3)
Calcium: 8.1 mg/dL — ABNORMAL LOW (ref 8.9–10.3)
Chloride: 104 mmol/L (ref 98–111)
Chloride: 106 mmol/L (ref 98–111)
Creatinine, Ser: 1.27 mg/dL — ABNORMAL HIGH (ref 0.61–1.24)
Creatinine, Ser: 1.33 mg/dL — ABNORMAL HIGH (ref 0.61–1.24)
GFR, Estimated: 54 mL/min — ABNORMAL LOW (ref >60)
GFR, Estimated: 51 mL/min — ABNORMAL LOW (ref >60)
Glucose, Bld: 105 mg/dL — ABNORMAL HIGH (ref 70–99)
Glucose, Bld: 110 mg/dL — ABNORMAL HIGH (ref 70–99)
Potassium: 3.1 mmol/L — ABNORMAL LOW (ref 3.5–5.1)
Potassium: 3.1 mmol/L — ABNORMAL LOW (ref 3.5–5.1)
Sodium: 136 mmol/L (ref 135–145)
Sodium: 135 mmol/L (ref 135–145)

## 2020-11-20 LAB — MAGNESIUM: Magnesium: 1.8 mg/dL (ref 1.7–2.4)

## 2020-11-20 LAB — CBC
HCT: 37.4 % — ABNORMAL LOW (ref 39.0–52.0)
Hemoglobin: 11.9 g/dL — ABNORMAL LOW (ref 13.0–17.0)
MCH: 27.2 pg (ref 26.0–34.0)
MCHC: 31.8 g/dL (ref 30.0–36.0)
MCV: 85.4 fL (ref 80.0–100.0)
Platelets: 211 10*3/uL (ref 150–400)
RBC: 4.38 MIL/uL (ref 4.22–5.81)
RDW: 15.9 % — ABNORMAL HIGH (ref 11.5–15.5)
WBC: 8.4 10*3/uL (ref 4.0–10.5)
nRBC: 0 % (ref 0.0–0.2)

## 2020-11-20 MED ORDER — MEMANTINE HCL 10 MG PO TABS: 10.0000 mg | ORAL_TABLET | Freq: Two times a day (BID) | ORAL | 12 refills | Status: AC

## 2020-11-20 MED ORDER — DOXAZOSIN MESYLATE 4 MG PO TABS: 4.0000 mg | ORAL_TABLET | Freq: Every day | ORAL | 0 refills | Status: AC

## 2020-11-20 MED ORDER — ACETAMINOPHEN 325 MG PO TABS: 650.0000 mg | ORAL_TABLET | Freq: Four times a day (QID) | ORAL | 1 refills | Status: DC | PRN

## 2020-11-20 MED ORDER — ATORVASTATIN CALCIUM 40 MG PO TABS: 40.0000 mg | ORAL_TABLET | Freq: Every day | ORAL | 3 refills | Status: AC

## 2020-11-20 MED ORDER — LEVOTHYROXINE SODIUM 75 MCG PO TABS: 75.0000 ug | ORAL_TABLET | Freq: Every day | ORAL | 0 refills | Status: AC

## 2020-11-20 MED ORDER — AMLODIPINE BESYLATE 10 MG PO TABS: 10.0000 mg | ORAL_TABLET | Freq: Every day | ORAL | 1 refills | Status: AC

## 2020-11-20 MED ORDER — ONE-DAILY MULTI VITAMINS PO TABS: 1.0000 | ORAL_TABLET | Freq: Every day | ORAL | 2 refills | Status: AC

## 2020-11-20 MED ORDER — CLOPIDOGREL BISULFATE 75 MG PO TABS: 75.0000 mg | ORAL_TABLET | Freq: Every day | ORAL | 3 refills | Status: AC

## 2020-11-20 MED ORDER — ACETAMINOPHEN 325 MG PO TABS: 650.0000 mg | ORAL_TABLET | Freq: Four times a day (QID) | ORAL | 1 refills | Status: AC | PRN

## 2020-11-20 NOTE — Progress Notes (Signed)
Physical Therapy Treatment Patient Details Name: Eduardo Vasquez MRN: [id number] DOB: [birth year] Today's Date: 11/20/2020    History of Present Illness Ruso LUTH is a 87 y.o. male with medical history significant for CVA, dementia, HTN.  Brought to the ED reports of generalized weakness.  History from patient is limited, he is awake alert, but not able to answer questions.  Daughter present at bedside assist with the history, says over the past few days patient has not been able to walk, requiring lots of assistance.    PT Comments    Patient requires much time and repeated verbal/tactile cueing to sit up at bedside with Mod assist, has difficulty completing sit to stands due to BLE weakness, incontinent of stool once standing and tolerated standing for up to 5-6 minutes while being cleaned by NT.  Patient able to take a few sides with mild improvement for advancing LLE, but continues to have most difficulty during stand to sitting due to falling backwards in to chair with poor carryover for reaching behind for armrest.  Patient tolerated sitting up in chair after therapy with family member present in room.  Patient will benefit from continued physical therapy in hospital and recommended venue below to increase strength, balance, endurance for safe ADLs and gait.     Follow Up Recommendations  SNF     Equipment Recommendations  Rolling walker with 5" wheels;3in1 (PT)    Recommendations for Other Services       Precautions / Restrictions Precautions Precautions: Fall Restrictions Weight Bearing Restrictions: No    Mobility  Bed Mobility Overal bed mobility: Needs Assistance Bed Mobility: Supine to Sit     Supine to sit: Mod assist     General bed mobility comments: slow labored movement with frequent verbal/tactile cueing to follow directions    Transfers Overall transfer level: Needs assistance Equipment used: Rolling walker (2 wheeled) Transfers: Sit to/from  [facility] Sit to Stand: Mod assist Stand pivot transfers: Mod assist       General transfer comment: increased time, labored movement  Ambulation/Gait Ambulation/Gait assistance: Mod assist;Max assist Gait Distance (Feet): 5 Feet Assistive device: Rolling walker (2 wheeled) Gait Pattern/deviations: Decreased step length - right;Decreased stance time - left;Decreased stride length;Shuffle Gait velocity: decreased   General Gait Details: limited to 5-6 slow labored side steps requiring frequent verbal/tactile cueing for safety and tactile assistance to move LLE   Stairs             Wheelchair Mobility    Modified Rankin (Stroke Patients Only)       Balance Overall balance assessment: Needs assistance Sitting-balance support: Feet supported;No upper extremity supported Sitting balance-Leahy Scale: Fair Sitting balance - Comments: fair/good static, fair dynamic with occasional leaning backwards Postural control: Posterior lean Standing balance support: During functional activity;Bilateral upper extremity supported Standing balance-Leahy Scale: Poor Standing balance comment: fair/poor using RW                            Cognition Arousal/Alertness: Awake/alert Behavior During Therapy: WFL for tasks assessed/performed;Restless;Flat affect Overall Cognitive Status: History of cognitive impairments - at baseline                                        Exercises General Exercises - Lower Extremity Ankle Circles/Pumps: Seated;AROM;Strengthening;Both;15 reps Long Arc Quad: Seated;AROM;Strengthening;Both;10 reps Hip Flexion/Marching:  Seated;AROM;Strengthening;Both;10 reps    General Comments        Pertinent Vitals/Pain Pain Assessment: No/denies pain    Home Living                      Prior Function            PT Goals (current goals can now be found in the care plan section) Acute Rehab PT  Goals Patient Stated Goal: return home PT Goal Formulation: With patient/family Time For Goal Achievement: 12/02/20 Potential to Achieve Goals: Good Progress towards PT goals: Progressing toward goals    Frequency    Min 3X/week      PT Plan Current plan remains appropriate    Co-evaluation              AM-PAC PT "6 Clicks" Mobility   Outcome Measure  Help needed turning from your back to your side while in a flat bed without using bedrails?: A Lot Help needed moving from lying on your back to sitting on the side of a flat bed without using bedrails?: A Lot Help needed moving to and from a bed to a chair (including a wheelchair)?: A Lot Help needed standing up from a chair using your arms (e.g., wheelchair or bedside chair)?: A Lot Help needed to walk in hospital room?: A Lot Help needed climbing 3-5 steps with a railing? : Total 6 Click Score: 11    End of Session   Activity Tolerance: Patient tolerated treatment well;Patient limited by fatigue Patient left: in chair;with call bell/phone within reach;with chair alarm set;with family/visitor present Nurse Communication: Mobility status PT Visit Diagnosis: Unsteadiness on feet (R26.81);Other abnormalities of gait and mobility (R26.89);History of falling (Z91.81);Muscle weakness (generalized) (M62.81)     Time: [id number] PT Time Calculation (min) (ACUTE ONLY): 24 min  Charges:  $Therapeutic Exercise: 8-22 mins $Therapeutic Activity: 8-22 mins                     2:08 PM, 11/20/20 Lonell Grandchild, MPT Physical Therapist with [facility] 336 [phone] office [phone] mobile phone

## 2020-11-20 NOTE — NC FL2 (Signed)
[place] MEDICAID FL2 LEVEL OF CARE SCREENING TOOL     IDENTIFICATION  Patient Name: Eduardo Vasquez Birthdate: 09-29-1929 Sex: male Admission Date (Current Location): 11/17/2020  [facility] and [place] Number:  [employer] and Address:  [place] [address] [place]      Provider Number: [id number]  Attending Physician Name and Address:  Roxan Hockey, MD  Relative Name and Phone Number:       Current Level of Care: Hospital Recommended Level of Care: [place] Prior Approval Number:    Date Approved/Denied:   PASRR Number: [id number]  Discharge Plan: SNF    Current Diagnoses: Patient Active Problem List   Diagnosis Date Noted   Encephalopathy 11/18/2020   Generalized weakness 11/17/2020   Stroke determined by clinical assessment ([place]) 11/07/2020   Acute right MCA stroke ([place]) 11/07/2020   Occlusion and stenosis of carotid artery without mention of cerebral infarction 11/03/2011    Orientation RESPIRATION BLADDER Height & Weight     Self  Normal External catheter Weight: 99.3 kg Height:  6' (182.9 cm)  BEHAVIORAL SYMPTOMS/MOOD NEUROLOGICAL BOWEL NUTRITION STATUS      Incontinent Diet (Dysphagia 2 with thin liquids)  AMBULATORY STATUS COMMUNICATION OF NEEDS Skin   Extensive Assist Verbally  (skin tears)                       Personal Care Assistance Level of Assistance  Bathing, Dressing, Feeding Bathing Assistance: Maximum assistance Feeding assistance: Limited assistance Dressing Assistance: Maximum assistance     Functional Limitations Info  Sight, Hearing, Speech Sight Info: Impaired Hearing Info: Adequate Speech Info: Adequate    SPECIAL CARE FACTORS FREQUENCY  PT (By licensed PT)     PT Frequency: 5x weekly              Contractures      Additional Factors Info  Code Status, Allergies, Psychotropic Code Status Info: Full code Allergies Info: No known  allergies Psychotropic Info: Aricept, Namenda         Current Medications (11/20/2020):  This is the current hospital active medication list Current Facility-Administered Medications  Medication Dose Route Frequency Provider Last Rate Last Admin   acetaminophen (TYLENOL) tablet 650 mg  650 mg Oral Q6H PRN Emokpae, Ejiroghene E, MD       Or   acetaminophen (TYLENOL) suppository 650 mg  650 mg Rectal Q6H PRN Emokpae, Ejiroghene E, MD       amLODipine (NORVASC) tablet 10 mg  10 mg Oral Daily Emokpae, Ejiroghene E, MD   10 mg at 11/20/20 0929   atorvastatin (LIPITOR) tablet 40 mg  40 mg Oral Daily Emokpae, Ejiroghene E, MD   40 mg at 11/20/20 0930   clopidogrel (PLAVIX) tablet 75 mg  75 mg Oral Daily Emokpae, Ejiroghene E, MD   75 mg at 11/20/20 0929   donepezil (ARICEPT) tablet 10 mg  10 mg Oral QHS Emokpae, Ejiroghene E, MD   10 mg at 11/19/20 2103   enoxaparin (LOVENOX) injection 40 mg  40 mg Subcutaneous Q24H Emokpae, Ejiroghene E, MD   40 mg at 11/19/20 2103   levothyroxine (SYNTHROID) tablet 75 mcg  75 mcg Oral Daily Emokpae, Ejiroghene E, MD   75 mcg at 11/20/20 0537   memantine (NAMENDA) tablet 10 mg  10 mg Oral BID Emokpae, Ejiroghene E, MD   10 mg at 11/20/20 0930   ondansetron (ZOFRAN) tablet 4 mg  4 mg  Oral Q6H PRN Emokpae, Ejiroghene E, MD       Or   ondansetron (ZOFRAN) injection 4 mg  4 mg Intravenous Q6H PRN Emokpae, Ejiroghene E, MD       polyethylene glycol (MIRALAX / GLYCOLAX) packet 17 g  17 g Oral Daily PRN Emokpae, Ejiroghene E, MD       tamsulosin (FLOMAX) capsule 0.4 mg  0.4 mg Oral Daily Emokpae, Ejiroghene E, MD   0.4 mg at 11/20/20 [id number]     Discharge Medications: Please see discharge summary for a list of discharge medications.  Relevant Imaging Results:  Relevant Lab Results:   Additional Information SSN: [id number]. Pfizer vaccines 05/05/19, 06/02/19, 01/25/20.  Boneta Lucks, RN

## 2020-11-20 NOTE — ED Triage Notes (Signed)
Pt discharged from [facility] today to [facility]. Family not happy with [facility]. Had EMS transport to [place] until other NHP can be arranged.

## 2020-11-20 NOTE — Progress Notes (Signed)
  Speech Language Pathology Treatment: Dysphagia  Patient Details Name: Eduardo Vasquez MRN: [id number] DOB: [birth year] Today's Date: 11/20/2020 Time: [id number] SLP Time Calculation (min) (ACUTE ONLY): 21 min  Assessment / Plan / Recommendation Clinical Impression  Ongoing diagnostic dysphagia therapy provided this am with Pt's breakfast tray. Pt's daughter [name] was present for treatment. Pt has a baseline congested cough that was noted upon SLP entering the room prior to any PO. Pt consumed consecutive sips of thin liquid via the straw without wet vocal quality or coughing; SLP provided trials of puree and D2/fine chop/soft textures and note slow and prolonged oral prep with mild oral residue after the swallow. Oral cavity was cleared with liquid wash. Throughout trials note occasional wet vocal quality that was easily cleared with verbal cue to throat clear and provide an additional dry swallow. Recommend continue with D2/fine chop diet and thin liquids. Recommend crush meds in puree. ST will continue to follow acutely to ensure diet tolerance.    HPI HPI: 87 y.o. male with medical history significant for CVA, dementia, HTN.  Brought to the ED reports of generalized weakness and ambulatory difficulty.  Recently admitted 8/20 7-10/2027 for rash right-sided basal infarct status post tPA but not a good candidate for long-term anticoagulation due to microhemorrhages on MRI.  Upon admission CT head was negative, neurology was consulted who recommended MRI and EEG. Pt with  with poor oral intake, cough and problems with swallowing.  Initial reports of confusion, staring spells raising concern for stroke, seizures.  CT without Acute abnormality. BSE requested. He had a BSE at [facility] on 8/29 during recent admission with recommendation for regular textures and thin liquis.      SLP Plan  Continue with current plan of care       Recommendations  Diet recommendations: Dysphagia 2 (fine chop);Thin  liquid Liquids provided via: Cup;Straw Medication Administration: Crushed with puree Supervision: Patient able to self feed;Intermittent supervision to cue for compensatory strategies Compensations: Minimize environmental distractions;Slow rate;Small sips/bites Postural Changes and/or Swallow Maneuvers: Seated upright 90 degrees;Upright 30-60 min after meal                Oral Care Recommendations: Oral care BID;Staff/trained caregiver to provide oral care Follow up Recommendations: Skilled Nursing facility SLP Visit Diagnosis: Dysphagia, unspecified (R13.10) Plan: Continue with current plan of care       Dawnell Bryant H. Roddie Mc, [place] Speech Language Pathologist       Wende Bushy 11/20/2020, 8:34 AM

## 2020-11-20 NOTE — Discharge Summary (Signed)
Eduardo Vasquez, is a 87 y.o. male  DOB [birth year]  MRN [id number].  Admission date:  11/17/2020  Admitting Physician  Bethena Roys, MD  Discharge Date:  11/20/2020   Primary MD  Rosita Fire, MD  Recommendations for primary care physician for things to follow:   1)Recommendations   Diet recommendations: Dysphagia 2 (fine chop);Thin liquid Liquids provided via: Cup;Straw Medication Administration: Crushed with puree Supervision: Patient able to self feed;Intermittent supervision to cue for compensatory strategies Compensations: Minimize environmental distractions;Slow rate;Small sips/bites Postural Changes and/or Swallow Maneuvers: Seated upright 90 degrees;Upright 30-60 min after meal  2)Please follow-up with Neurologist Dr. Phillips Odor-- Phone: [phone], Address: [address], [place], [place] [address] in 4 to 6 weeks for recheck and reevaluation.  Please call to make appointment with him    Admission Diagnosis  Cough [R05.9] Weakness [R53.1] Encephalopathy [G93.40]   Discharge Diagnosis  Cough [R05.9] Weakness [R53.1] Encephalopathy [G93.40]    Principal Problem:   Generalized weakness Active Problems:   Stroke determined by clinical assessment ([place])   Encephalopathy      Past Medical History:  Diagnosis Date   Cancer ([place]) 2009   Prostate   Carotid artery occlusion    Dementia ([place])    Hypercholesteremia    Hypertension    Stroke [facility]) 2003   Mini - Left Eye- Partial Blindness   Urgency incontinence     Past Surgical History:  Procedure Laterality Date   INGUINAL HERNIA REPAIR Right 12/02/2013   Procedure: HERNIA REPAIR INGUINAL ADULT WITH MESH;  Surgeon: Jamesetta So, MD;  Location: [facility];  Service: General;  Laterality: Right;   INSERTION OF MESH Right 12/02/2013   Procedure: INSERTION OF MESH;  Surgeon: Jamesetta So, MD;  Location: [facility];   Service: General;  Laterality: Right;     HPI  from the history and physical done on the day of admission:      Chief Complaint: Weakness   HPI: Eduardo Vasquez is a 87 y.o. male with medical history significant for CVA, dementia, HTN.  Brought to the ED reports of generalized weakness.  History from patient is limited, he is awake alert, but not able to answer questions.  Daughter present at bedside assist with the history, says over the past few days patient has not been able to walk, requiring lots of assistance.   Patient was recently admitted 8/27-8/29, came in with left-sided weakness and slurred speech, transferred to [facility] under neurology service for large right basal infarct of cryptogenic etiology likely cardioembolic.  NIHSS was 6, patient was determined to be a candidate for IV tPA and this was given.  Further work-up with event monitor deferred as he is not a good candidate for anticoagulation due to dementia and microhemorrhages that were noted on MRI brain.   Patient was seen for receiving physical therapy at home today, and provider felt patient was significantly different from when he was seen after hospitalization.   Daughter denies confusion to me, but  reports poor oral intake over the past 2 days, not swallowing.  Also reports of a cough over the past 2 days.  Patient was brought to the ED 8/31, after hospital discharge, for staring spells, head CT and work-up was unremarkable, he was back to his baseline so he was discharged from the ED.  Daughter is unaware of any further staring spells since then.   Patient has baseline dementia, mostly involving short-term memory, he is able to recognize family.   ED Course: Temp 98.9.  Heart rate 60s.  Blood pressure 123456 systolic.  O2 sats 98-100 on room air, potassium 3.2.  WBC 6.7.  Creatinine stable 1.43.  Head CT without acute abnormality. EDP talked to neurologist Dr. Curly Shores, recommended admission, MRI, EEG.   Review of  Systems: As per HPI all other systems reviewed and negative.    Hospital Course:    Generalized weakness due to recurrent acute CVA-   stuttering lacunar stroke Vs cryptogenic stroke from embolic phenomena, though he does not have significant intracranial atherosclerosis on MRA -Further cardiac work up with cardiac event monitor deferred given he is not a good candidate for anticoagulation due to dementia and microhemorrhages noted on MRI Brain  CT without Acute abnormality. - Neurology-MRI which showed recurrent CVA.  EEG was negative.   As per  Dr. Curly Shores from neuro  recommending plavix and lipitor - PT/OT-SNF - UA- neg - TSH, B12, folate-wnl -LDL 82 and hemoglobin A1c 6.4 -Please see full neurology consult note and recommendations dated 11/19/2020 listed under the heading-plan of care by neurologist Dr. Curly Shores   HTN-  on Norvasc   Diet-  speech eval appreciated recommends , Dysphagia 2 (fine chop);Thin liquid  -Dementia--- okay to start Aricept and Namenda  Hypothyroidism--- continue levothyroxine  Discharge Condition: stable - Follow UP   Contact information for follow-up providers     Phillips Odor, MD Follow up.   Specialty: Neurology Contact information: [place] [address] [phone]              Contact information for after-discharge care     [facility] .   Service: Skilled Nursing Contact information: [address] [place] [phone]                      Consults obtained - neurology  Diet and Activity recommendation:  As advised  Discharge Instructions    Discharge Instructions     Call MD for:  difficulty breathing, headache or visual disturbances   Complete by: As directed    Call MD for:  persistant dizziness or light-headedness   Complete by: As directed    Call MD for:  persistant nausea and vomiting   Complete by: As directed     Call MD for:  temperature >100.4   Complete by: As directed    Diet - low sodium heart healthy   Complete by: As directed    Recommendations   Diet recommendations: Dysphagia 2 (fine chop);Thin liquid Liquids provided via: Cup;Straw Medication Administration: Crushed with puree Supervision: Patient able to self feed;Intermittent supervision to cue for compensatory strategies Compensations: Minimize environmental distractions;Slow rate;Small sips/bites Postural Changes and/or Swallow Maneuvers: Seated upright 90 degrees;Upright 30-60 min after meal   Discharge instructions   Complete by: As directed    1)Recommendations   Diet recommendations: Dysphagia 2 (fine chop);Thin liquid Liquids provided via: Cup;Straw Medication Administration: Crushed with puree Supervision: Patient able  to self feed;Intermittent supervision to cue for compensatory strategies Compensations: Minimize environmental distractions;Slow rate;Small sips/bites Postural Changes and/or Swallow Maneuvers: Seated upright 90 degrees;Upright 30-60 min after meal  2)Please follow-up with Neurologist Dr. Phillips Odor-- Phone: [phone], Address: [address], [place],  [place] [address] in 4 to 6 weeks for recheck and reevaluation.  Please call to make appointment with him   Increase activity slowly   Complete by: As directed    Physical therapy and  occupational therapy treatments recommended         Discharge Medications     Allergies as of 11/20/2020   No Known Allergies      Medication List     STOP taking these medications    Lumigan 0.01 % Soln Generic drug: bimatoprost   tamsulosin 0.4 MG Caps capsule Commonly known as: FLOMAX       TAKE these medications    acetaminophen 325 MG tablet Commonly known as: TYLENOL Take 2 tablets (650 mg total) by mouth every 6 (six) hours as needed for mild pain or fever (or Fever >/= 101).   amLODipine 10 MG tablet Commonly known as: NORVASC Take  10 mg by mouth daily.   atorvastatin 40 MG tablet Commonly known as: LIPITOR Take 1 tablet (40 mg total) by mouth daily.   clopidogrel 75 MG tablet Commonly known as: PLAVIX Take 1 tablet (75 mg total) by mouth daily.   donepezil 10 MG tablet Commonly known as: ARICEPT TAKE 1 TABLET(10 MG) BY MOUTH AT BEDTIME What changed: See the new instructions.   doxazosin 4 MG tablet Commonly known as: CARDURA Take 4 mg by mouth daily.   levothyroxine 75 MCG tablet Commonly known as: SYNTHROID Take 75 mcg by mouth daily.   memantine 10 MG tablet Commonly known as: NAMENDA Take 1 tablet (10 mg total) by mouth 2 (two) times daily.   multivitamin tablet Take 1 tablet by mouth daily.        Major procedures and Radiology Reports - PLEASE review detailed and final reports for all details, in brief -  DG Chest 1 View  Result Date: 11/07/2020 CLINICAL DATA:  Stroke/hx prostate ca/dementia/htn/non smoker Covid neg today EXAM: CHEST  1 VIEW COMPARISON:  None. FINDINGS: The heart and mediastinal contours are within normal limits. No focal consolidation. No pulmonary edema. No pleural effusion. No pneumothorax. No acute osseous abnormality. IMPRESSION: No active disease. Electronically Signed   By: Iven Finn M.D.   On: 11/07/2020 15:10   CT HEAD WO CONTRAST (5MM)  Result Date: 11/17/2020 CLINICAL DATA:  Mental status change, unknown cause EXAM: CT HEAD WITHOUT CONTRAST TECHNIQUE: Contiguous axial images were obtained from the base of the skull through the vertex without intravenous contrast. COMPARISON:  11/11/2020 FINDINGS: Brain: No evidence of acute infarction, hemorrhage, hydrocephalus, extra-axial collection or mass lesion/mass effect. Moderate low-density changes within the periventricular and subcortical white matter compatible with chronic microvascular ischemic change. Mild diffuse cerebral volume loss. Vascular: Atherosclerotic calcifications involving the large vessels of the skull  base. No unexpected hyperdense vessel. Skull: Normal. Negative for fracture or focal lesion. Sinuses/Orbits: No acute finding. Other: None. IMPRESSION: 1. No acute intracranial findings. 2. Chronic microvascular ischemic change and cerebral volume loss. Electronically Signed   By: Davina Poke D.O.   On: 11/17/2020 15:43   CT HEAD WO CONTRAST (5MM)  Result Date: 11/11/2020 CLINICAL DATA:  Neurologic deficit. EXAM: CT HEAD WITHOUT CONTRAST TECHNIQUE: Contiguous axial images were obtained from the base of the skull  through the vertex without intravenous contrast. COMPARISON:  Head CT dated 11/07/2020. FINDINGS: Brain: Moderate age-related atrophy and chronic microvascular ischemic changes. There is no acute intracranial hemorrhage. No mass effect or midline shift no extra-axial fluid collection Vascular: No hyperdense vessel or unexpected calcification. Skull: Normal. Negative for fracture or focal lesion. Sinuses/Orbits: No acute finding. Other: None IMPRESSION: 1. No acute intracranial pathology. 2. Moderate age-related atrophy and chronic microvascular ischemic changes. Electronically Signed   By: Anner Crete M.D.   On: 11/11/2020 22:41   MR ANGIO HEAD WO CONTRAST  Result Date: 11/18/2020 CLINICAL DATA:  Generalized weakness, unable to walk, confusion, recent CVA received IV TPA. EXAM: MRI HEAD WITHOUT CONTRAST MRA HEAD WITHOUT CONTRAST TECHNIQUE: Multiplanar, multi-echo pulse sequences of the brain and surrounding structures were acquired without intravenous contrast. Angiographic images of the Circle of Willis were acquired using MRA technique without intravenous contrast. COMPARISON:  Head CT 11/17/2020 and MRI 11/08/2020 FINDINGS: MRI HEAD FINDINGS Brain: There is restricted diffusion involving the right basal ganglia (caudate body and lentiform nucleus) and corona radiata which is in the same location as the acute infarct on the prior MRI, however the extent of the diffusion abnormality today  is greater than on the prior MRI with more intensely restricted diffusion consistent with recurrent acute ischemia. Superficial siderosis is again seen over the posterior right cerebral convexity consistent with remote subarachnoid hemorrhage with some chronic parenchymal microhemorrhages also again noted in this region. Patchy and confluent T2 hyperintensities in the cerebral white matter bilaterally are unchanged and nonspecific but compatible with severe chronic small vessel ischemic disease. Asymmetrically advanced volume loss is again noted in the mesial right temporal lobe. No mass, midline shift, or extra-axial fluid collection is identified. Vascular: Major intracranial vascular flow voids are preserved. Skull and upper cervical spine: Unremarkable bone marrow signal. Sinuses/Orbits: Unremarkable orbits. Mild mucosal thickening in the frontal and ethmoid sinuses bilaterally. Clear mastoid air cells. Other: None. MRA HEAD FINDINGS Anterior circulation: The internal carotid arteries are patent from skull base to carotid termini with at most mild supraclinoid stenosis bilaterally. ACAs and MCAs are patent without evidence of a proximal branch occlusion or significant proximal stenosis. No aneurysm is identified. Posterior circulation: The intracranial vertebral arteries are widely patent to the basilar and codominant. Patent PICA and SCA origins are seen bilaterally. The basilar artery is widely patent. Posterior communicating arteries are diminutive or absent. Both PCAs are patent without evidence of a significant proximal stenosis. No aneurysm is identified. Anatomic variants: None. IMPRESSION: 1. Recurrent acute ischemia in the right basal ganglia. 2. Severe chronic small vessel ischemic disease. 3. No major intracranial arterial occlusion or high-grade proximal stenosis. Electronically Signed   By: Logan Bores M.D.   On: 11/18/2020 10:12   MR BRAIN WO CONTRAST  Result Date: 11/18/2020 CLINICAL DATA:   Generalized weakness, unable to walk, confusion, recent CVA received IV TPA. EXAM: MRI HEAD WITHOUT CONTRAST MRA HEAD WITHOUT CONTRAST TECHNIQUE: Multiplanar, multi-echo pulse sequences of the brain and surrounding structures were acquired without intravenous contrast. Angiographic images of the Circle of Willis were acquired using MRA technique without intravenous contrast. COMPARISON:  Head CT 11/17/2020 and MRI 11/08/2020 FINDINGS: MRI HEAD FINDINGS Brain: There is restricted diffusion involving the right basal ganglia (caudate body and lentiform nucleus) and corona radiata which is in the same location as the acute infarct on the prior MRI, however the extent of the diffusion abnormality today is greater than on the prior MRI with more intensely restricted diffusion  consistent with recurrent acute ischemia. Superficial siderosis is again seen over the posterior right cerebral convexity consistent with remote subarachnoid hemorrhage with some chronic parenchymal microhemorrhages also again noted in this region. Patchy and confluent T2 hyperintensities in the cerebral white matter bilaterally are unchanged and nonspecific but compatible with severe chronic small vessel ischemic disease. Asymmetrically advanced volume loss is again noted in the mesial right temporal lobe. No mass, midline shift, or extra-axial fluid collection is identified. Vascular: Major intracranial vascular flow voids are preserved. Skull and upper cervical spine: Unremarkable bone marrow signal. Sinuses/Orbits: Unremarkable orbits. Mild mucosal thickening in the frontal and ethmoid sinuses bilaterally. Clear mastoid air cells. Other: None. MRA HEAD FINDINGS Anterior circulation: The internal carotid arteries are patent from skull base to carotid termini with at most mild supraclinoid stenosis bilaterally. ACAs and MCAs are patent without evidence of a proximal branch occlusion or significant proximal stenosis. No aneurysm is identified.  Posterior circulation: The intracranial vertebral arteries are widely patent to the basilar and codominant. Patent PICA and SCA origins are seen bilaterally. The basilar artery is widely patent. Posterior communicating arteries are diminutive or absent. Both PCAs are patent without evidence of a significant proximal stenosis. No aneurysm is identified. Anatomic variants: None. IMPRESSION: 1. Recurrent acute ischemia in the right basal ganglia. 2. Severe chronic small vessel ischemic disease. 3. No major intracranial arterial occlusion or high-grade proximal stenosis. Electronically Signed   By: Logan Bores M.D.   On: 11/18/2020 10:12   MR BRAIN WO CONTRAST  Result Date: 11/08/2020 CLINICAL DATA:  Stroke follow-up. EXAM: MRI HEAD WITHOUT CONTRAST TECHNIQUE: Multiplanar, multiecho pulse sequences of the brain and surrounding structures were obtained without intravenous contrast. COMPARISON:  Head CT 11/07/2020 and MRI 10/09/2015 FINDINGS: Brain: There is a small acute infarct involving the right basal ganglia and corona radiata. Patchy and confluent T2 hyperintensities in the cerebral white matter bilaterally have progressed from the prior MRI and are nonspecific but compatible with severe chronic small vessel ischemic disease. Susceptibility artifact involving multiple right-sided cerebral sulci in the temporal, occipital, and parietal regions has increased from the prior study and is consistent with previous subarachnoid hemorrhage, also with scattered chronic parenchymal microhemorrhages in this region. There may be a small amount of superficial siderosis over the posterior left cerebral convexity as well versus susceptibility from vessels. There is mild-to-moderate global cerebral atrophy with asymmetric volume loss noted in the mesial temporal lobe on the right. No mass, midline shift, or extra-axial fluid collection is evident. Vascular: Major intracranial vascular flow voids are preserved. Skull and upper  cervical spine: Unremarkable bone marrow signal para Sinuses/Orbits: Unremarkable orbits. Paranasal sinuses and mastoid air cells are clear. Other: None. IMPRESSION: 1. Small acute right basal ganglia/corona radiata infarct. 2. Severe chronic small vessel ischemic disease. 3. Evidence of previous subarachnoid hemorrhage and underlying chronic parenchymal microhemorrhages in the posterior right cerebral hemisphere, query cerebral amyloid angiopathy. Electronically Signed   By: Logan Bores M.D.   On: 11/08/2020 19:15   DG CHEST PORT 1 VIEW  Result Date: 11/17/2020 CLINICAL DATA:  Increased weakness EXAM: PORTABLE CHEST 1 VIEW COMPARISON:  11/07/2020 FINDINGS: The heart size and mediastinal contours are within normal limits. Both lungs are clear. The visualized skeletal structures are unremarkable. IMPRESSION: No active disease. Electronically Signed   By: Donavan Foil M.D.   On: 11/17/2020 18:02   EEG adult  Result Date: 11/18/2020 Lora Havens, MD     11/18/2020  1:08 PM Patient Name: Amaryllis Dyke  MRN: [id number] Epilepsy Attending: Lora Havens Referring Physician/Provider: Dr Jenetta Downer Date: 11/18/2020 Duration: 23.06 mins Patient history: 87yo m with reports of confusion, staring spells. EEG to evaluate for seizure Level of alertness: Awake AEDs during EEG study: None Technical aspects: This EEG study was done with scalp electrodes positioned according to the 10-20 International system of electrode placement. Electrical activity was acquired at a sampling rate of '500Hz'$  and reviewed with a high frequency filter of '70Hz'$  and a low frequency filter of '1Hz'$ . EEG data were recorded continuously and digitally stored. Description: The posterior dominant rhythm consists of 7.5 Hz activity of moderate voltage (25-35 uV) seen predominantly in posterior head regions, symmetric and reactive to eye opening and eye closing. Hyperventilation and photic stimulation were not performed.   IMPRESSION: This  study is within normal limits. No seizures or epileptiform discharges were seen throughout the recording. Lora Havens   ECHOCARDIOGRAM COMPLETE  Result Date: 11/09/2020    ECHOCARDIOGRAM REPORT   Patient Name:   MORICE KUCZKOWSKI Date of Exam: 11/09/2020 Medical Rec #:  [id number]        Height:       72.0 in Accession #:    [id number]       Weight:       169.0 lb Date of Birth:  04/26/1929       BSA:          1.983 m Patient Age:    87 years         BP:           155/88 mmHg Patient Gender: M                HR:           70 bpm. Exam Location:  Inpatient Procedure: 2D Echo, Cardiac Doppler and Color Doppler Indications:    Stroke I63.9  History:        Patient has no prior history of Echocardiogram examinations.                 Carotid Disease; Risk Factors:Hypertension and Dyslipidemia.                 History of Cancer.  Sonographer:    Darlina Sicilian RDCS Referring Phys: [phone] ERIC LINDZEN  Sonographer Comments: Suboptimal parasternal window and suboptimal subcostal window. Image acquisition challenging due to respiratory motion. IMPRESSIONS  1. Left ventricular ejection fraction, by estimation, is 55 to 60%. The left ventricle has normal function. The left ventricle has no regional wall motion abnormalities. There is mild focal basal septal left ventricular hypertrophy. Left ventricular diastolic parameters are indeterminate.  2. Right ventricular systolic function is normal. The right ventricular size is normal. Tricuspid regurgitation signal is inadequate for assessing PA pressure.  3. The mitral valve is normal in structure. No evidence of mitral valve regurgitation. No evidence of mitral stenosis.  4. The aortic valve is tricuspid. Aortic valve regurgitation is trivial. Mild aortic valve sclerosis is present, with no evidence of aortic valve stenosis.  5. The inferior vena cava is normal in size with greater than 50% respiratory variability, suggesting right atrial pressure of 3 mmHg.  6. Technically  difficult study with poor acoustic windows. Tech FINDINGS  Left Ventricle: Left ventricular ejection fraction, by estimation, is 55 to 60%. The left ventricle has normal function. The left ventricle has no regional wall motion abnormalities. The left ventricular internal cavity size was normal in size. There is  mild focal  basal septal left ventricular hypertrophy. Left ventricular diastolic parameters are indeterminate. Right Ventricle: The right ventricular size is normal. No increase in right ventricular wall thickness. Right ventricular systolic function is normal. Tricuspid regurgitation signal is inadequate for assessing PA pressure. Left Atrium: Left atrial size was normal in size. Right Atrium: Right atrial size was normal in size. Pericardium: There is no evidence of pericardial effusion. Mitral Valve: The mitral valve is normal in structure. No evidence of mitral valve regurgitation. No evidence of mitral valve stenosis. Tricuspid Valve: The tricuspid valve is normal in structure. Tricuspid valve regurgitation is not demonstrated. Aortic Valve: The aortic valve is tricuspid. Aortic valve regurgitation is trivial. Mild aortic valve sclerosis is present, with no evidence of aortic valve stenosis. Pulmonic Valve: The pulmonic valve was not well visualized. Pulmonic valve regurgitation is not visualized. Aorta: The aortic root was not well visualized. Venous: The inferior vena cava is normal in size with greater than 50% respiratory variability, suggesting right atrial pressure of 3 mmHg. IAS/Shunts: No atrial level shunt detected by color flow Doppler.  LEFT VENTRICLE PLAX 2D LVOT diam:     1.90 cm  Diastology LV SV:         59       LV e' medial:    6.96 cm/s LV SV Index:   30       LV E/e' medial:  7.1 LVOT Area:     2.84 cm LV e' lateral:   6.74 cm/s                         LV E/e' lateral: 7.4  RIGHT VENTRICLE RV S prime:     11.00 cm/s TAPSE (M-mode): 1.3 cm LEFT ATRIUM           Index       RIGHT  ATRIUM          Index LA Vol (A4C): 27.6 ml 13.92 ml/m RA Area:     9.96 cm                                   RA Volume:   19.00 ml 9.58 ml/m  AORTIC VALVE LVOT Vmax:   108.00 cm/s LVOT Vmean:  66.400 cm/s LVOT VTI:    0.208 m  AORTA Ao Root diam: 3.00 cm MITRAL VALVE MV Area (PHT): 3.23 cm    SHUNTS MV Decel Time: 235 msec    Systemic VTI:  0.21 m MV E velocity: 49.70 cm/s  Systemic Diam: 1.90 cm MV A velocity: 88.30 cm/s MV E/A ratio:  0.56 Dalton McleanMD Electronically signed by Franki Monte Signature Date/Time: 11/09/2020/2:29:46 PM    Final    CT HEAD CODE STROKE WO CONTRAST  Result Date: 11/07/2020 CLINICAL DATA:  Code stroke. Neuro deficit, acute, stroke suspected. Found down. Right-sided weakness. EXAM: CT HEAD WITHOUT CONTRAST TECHNIQUE: Contiguous axial images were obtained from the base of the skull through the vertex without intravenous contrast. COMPARISON:  Head MRI 10/09/2015 FINDINGS: Brain: There is no evidence of an acute infarct, intracranial hemorrhage, mass, midline shift, or extra-axial fluid collection. Confluent hypodensities in the cerebral white matter bilaterally are nonspecific but compatible with severe chronic small vessel ischemic disease which has progressed from the prior MRI. There is a chronic lacunar infarct in the left caudate nucleus. There is mild global cerebral atrophy with asymmetric volume loss noted in the mesial  right temporal lobe. Vascular: Calcified atherosclerosis at the skull base. No hyperdense vessel. Skull: No fracture or suspicious osseous lesion. Sinuses/Orbits: Visualized paranasal sinuses and mastoid air cells are clear. Unremarkable orbits. Other: None. ASPECTS [facility] Stroke Program Early CT Score) - Ganglionic level infarction (caudate, lentiform nuclei, internal capsule, insula, M1-M3 cortex): 7 - Supraganglionic infarction (M4-M6 cortex): 3 Total score (0-10 with 10 being normal): 10 IMPRESSION: 1. No evidence of acute intracranial  abnormality.  ASPECTS of 10. 2. Severe chronic small vessel ischemic disease. These results were called by telephone at the time of interpretation on 11/07/2020 at 1:43 pm to Dr. Nanda Quinton, who verbally acknowledged these results. Electronically Signed   By: Logan Bores M.D.   On: 11/07/2020 13:43   VAS US CAROTID  Result Date: 11/09/2020 Carotid Arterial Duplex Study Patient Name:  BRIAM KNUCKLES  Date of Exam:   11/09/2020 Medical Rec #: [id number]         Accession #:    [id number] Date of Birth: [birth year]        Patient Gender: M Patient Age:   63 years Exam Location:  [facility] Procedure:      VAS US CAROTID Referring Phys: Laurey Morale --------------------------------------------------------------------------------  Indications:       CVA. Risk Factors:      Hypertension, hyperlipidemia. Comparison Study:  05/26/2017 carotid artery duplex- bilateral 1-39% ICA                    stenosis. Performing Technologist: Maudry Mayhew MHA, RDMS, RVT, RDCS  Examination Guidelines: A complete evaluation includes B-mode imaging, spectral Doppler, color Doppler, and power Doppler as needed of all accessible portions of each vessel. Bilateral testing is considered an integral part of a complete examination. Limited examinations for reoccurring indications may be performed as noted.  Right Carotid Findings: +----------+--------+--------+--------+--------------------------+--------+           PSV cm/sEDV cm/sStenosisPlaque Description        Comments +----------+--------+--------+--------+--------------------------+--------+ CCA Prox  74      11                                                 +----------+--------+--------+--------+--------------------------+--------+ CCA Distal63      12                                                 +----------+--------+--------+--------+--------------------------+--------+ ICA Prox  46      9               irregular and heterogenous          +----------+--------+--------+--------+--------------------------+--------+ ICA Distal43      10                                                 +----------+--------+--------+--------+--------------------------+--------+ ECA       70      8                                                  +----------+--------+--------+--------+--------------------------+--------+ +----------+--------+-------+----------------+-------------------+  PSV cm/sEDV cmsDescribe        Arm Pressure (mmHG) +----------+--------+-------+----------------+-------------------+ [id number]            Multiphasic, WNL                    +----------+--------+-------+----------------+-------------------+ +---------+--------+--+--------+-+---------+ VertebralPSV cm/s27EDV cm/s9Antegrade +---------+--------+--+--------+-+---------+  Left Carotid Findings: +----------+--------+--------+--------+--------------------------+--------+           PSV cm/sEDV cm/sStenosisPlaque Description        Comments +----------+--------+--------+--------+--------------------------+--------+ CCA Prox  67      10                                                 +----------+--------+--------+--------+--------------------------+--------+ CCA Distal78      15              heterogenous and irregular         +----------+--------+--------+--------+--------------------------+--------+ ICA Prox  136     24              calcific                           +----------+--------+--------+--------+--------------------------+--------+ ICA Distal86      17                                                 +----------+--------+--------+--------+--------------------------+--------+ ECA       118     16              heterogenous and irregular         +----------+--------+--------+--------+--------------------------+--------+ +----------+--------+--------+----------------+-------------------+           PSV  cm/sEDV cm/sDescribe        Arm Pressure (mmHG) +----------+--------+--------+----------------+-------------------+ Subclavian113             Multiphasic, WNL                    +----------+--------+--------+----------------+-------------------+ +---------+--------+--+--------+-+---------+ VertebralPSV cm/s45EDV cm/s7Antegrade +---------+--------+--+--------+-+---------+   Summary: Right Carotid: Velocities in the right ICA are consistent with a 1-39% stenosis. Left Carotid: Velocities in the left ICA are consistent with a 1-39% stenosis,               however velocities may be underestimated due to calcific               shadowing. Vertebrals:  Bilateral vertebral arteries demonstrate antegrade flow. Subclavians: Normal flow hemodynamics were seen in bilateral subclavian              arteries. *See table(s) above for measurements and observations.  Electronically signed by Antony Contras MD on 11/09/2020 at 1:40:40 PM.    Final    VAS [place] TRANSCRANIAL DOPPLER  Result Date: 11/09/2020  Transcranial Doppler Patient Name:  AREN SAUPE  Date of Exam:   11/09/2020 Medical Rec #: [id number]         Accession #:    [id number] Date of Birth: September 09, 1929        Patient Gender: M Patient Age:   64 years Exam Location:  [facility] Procedure:      VAS [place] TRANSCRANIAL DOPPLER Referring Phys: PRAMOD SETHI --------------------------------------------------------------------------------  Indications: Stroke. History: HTN, hyperlipidemia. Limitations: Poor acoustic windows  Limitations for diagnostic windows: Unable to insonate right transtemporal window. Unable to insonate left transtemporal window. Unable to insonate occipital window. Comparison Study: No prior study Performing Technologist: Maudry Mayhew MHA, RDMS, RVT, RDCS  Examination Guidelines: A complete evaluation includes B-mode imaging, spectral Doppler, color Doppler, and power Doppler as needed of all accessible portions of each vessel.  Bilateral testing is considered an integral part of a complete examination. Limited examinations for reoccurring indications may be performed as noted.  +----------+-------------+----------+-----------+------------------+ RIGHT TCD Right VM (cm)Depth (cm)Pulsatility     Comment       +----------+-------------+----------+-----------+------------------+ MCA                                         Unable to insonate +----------+-------------+----------+-----------+------------------+ ACA                                         Unable to insonate +----------+-------------+----------+-----------+------------------+ Term ICA                                    Unable to insonate +----------+-------------+----------+-----------+------------------+ PCA                                         Unable to insonate +----------+-------------+----------+-----------+------------------+ Opthalmic     12.00                 1.07                       +----------+-------------+----------+-----------+------------------+ ICA siphon                                  Unable to insonate +----------+-------------+----------+-----------+------------------+ Vertebral                                   Unable to insonate +----------+-------------+----------+-----------+------------------+  +----------+------------+----------+-----------+------------------+ LEFT TCD  Left VM (cm)Depth (cm)Pulsatility     Comment       +----------+------------+----------+-----------+------------------+ MCA                                        Unable to insonate +----------+------------+----------+-----------+------------------+ ACA                                        Unable to insonate +----------+------------+----------+-----------+------------------+ Term ICA                                   Unable to insonate +----------+------------+----------+-----------+------------------+ PCA                                         Unable to insonate +----------+------------+----------+-----------+------------------+ Opthalmic    12.00  1.08                       +----------+------------+----------+-----------+------------------+ ICA siphon                                 Unable to insonate +----------+------------+----------+-----------+------------------+ Vertebral                                  Unable to insonate +----------+------------+----------+-----------+------------------+  +------------+-----+------------------+             VM cm     Comment       +------------+-----+------------------+ Prox Basilar     Unable to insonate +------------+-----+------------------+ Dist Basilar     Unable to insonate +------------+-----+------------------+ Summary:  Huighly suboptimal study with poor windows throughout. antegrade flow noted in both opthalmic arteries *See table(s) above for TCD measurements and observations.  Diagnosing physician: Antony Contras MD Electronically signed by Antony Contras MD on 11/09/2020 at 1:42:29 PM.    Final     Micro Results  Recent Results (from the past 240 hour(s))  SARS CORONAVIRUS 2 (TAT 6-24 HRS) Nasopharyngeal Nasopharyngeal Swab     Status: None   Collection Time: 11/17/20  5:20 PM   Specimen: Nasopharyngeal Swab  Result Value Ref Range Status   SARS Coronavirus 2 NEGATIVE NEGATIVE Final    Comment: (NOTE) SARS-CoV-2 target nucleic acids are NOT DETECTED.  The SARS-CoV-2 RNA is generally detectable in upper and lower respiratory specimens during the acute phase of infection. Negative results do not preclude SARS-CoV-2 infection, do not rule out co-infections with other pathogens, and should not be used as the sole basis for treatment or other patient management decisions. Negative results must be combined with clinical observations, patient history, and epidemiological information. The expected result is  Negative.  Fact Sheet for Patients: SugarRoll.be  Fact Sheet for Healthcare Providers: https://www.woods-mathews.com/  This test is not yet approved or cleared by the [place] FDA and  has been authorized for detection and/or diagnosis of SARS-CoV-2 by FDA under an Emergency Use Authorization (EUA). This EUA will remain  in effect (meaning this test can be used) for the duration of the COVID-19 declaration under Se ction 564(b)(1) of the Act, 21 U.S.C. section 360bbb-3(b)(1), unless the authorization is terminated or revoked sooner.  Performed at [facility], [place] [address] [place], [place] [address]        Today   Subjective    Stacie Filbrun today has no new complaints  No fever  Or chills  No nausea, vomiting, diarrhea    -- Patient's daughter from [place] and granddaughter at bedside   Patient has been seen and examined prior to discharge   Objective   Blood pressure (!) 149/85, pulse 99, temperature 98.8 F (37.1 C), temperature source Oral, resp. rate 18, height 6' (1.829 m), weight 99.3 kg, SpO2 100 %.   Intake/Output Summary (Last 24 hours) at 11/20/2020 1507 Last data filed at 11/20/2020 0537 Gross per 24 hour  Intake 240 ml  Output --  Net 240 ml    Exam Gen:- Awake Alert, no acute distress  HEENT:- Happy.AT, No sclera icterus Neck-Supple Neck,No JVD,.  Lungs-  CTAB , good air movement bilaterally  CV- S1, S2 normal, regular Abd-  +ve B.Sounds, Abd Soft, No tenderness,    Extremity/Skin:- No  edema,   good  pulses Psych-baseline cognitive and memory deficits  neuro-generalized weakness and deconditioning, appears weak on the left compared to the right , no tremors    Data Review   CBC w Diff:  Lab Results  Component Value Date   WBC 8.1 11/18/2020   HGB 11.3 (L) 11/18/2020   HCT 34.8 (L) 11/18/2020   PLT 215 11/18/2020   LYMPHOPCT 23 11/17/2020   MONOPCT 11 11/17/2020   EOSPCT 2 11/17/2020    BASOPCT 1 11/17/2020    CMP:  Lab Results  Component Value Date   NA 136 11/20/2020   K 3.1 (L) 11/20/2020   CL 104 11/20/2020   CO2 21 (L) 11/20/2020   BUN 11 11/20/2020   CREATININE 1.27 (H) 11/20/2020   PROT 7.3 11/17/2020   ALBUMIN 3.3 (L) 11/17/2020   BILITOT 0.8 11/17/2020   ALKPHOS 82 11/17/2020   AST 26 11/17/2020   ALT 20 11/17/2020  .   Total Discharge time is about 33 minutes  Roxan Hockey M.D on 11/20/2020 at 3:07 PM  Go to [email or web address] -  for contact info  [facility] - Office  [phone]

## 2020-11-20 NOTE — Discharge Instructions (Addendum)
Eduardo Vasquez's bloodtests today appeared stable and did not show signs of new dehydration, infection, or other cause for hospitalization.  I explained that unfortunately we cannot hold patients in the ER for placement at new nursing facilities.  This process should be completed with his active nursing facility, in discussion with the case manager there.  I would recommend that you call and talk to the staff in [place] about transferring your father to another facility if you are not happy with his care at that facility.  I do believe that he needs a higher level of care such as a skilled nursing facility, given his disabilities and poor mental function.  You told me you would be bringing him home instead.  If you choose to bring Eduardo Vasquez home with you, I have represcribed all of his current medications to his pharmacy, including the dosing and timing of these medications.    Please be aware that Eduardo Vasquez was diagnosed with a stroke while he was in the hospital, and may have difficulty with swallowing.  We recommend using applesauce, soft foods and liquids.  It is not clear if his mental status will ever improve after the stroke, with his history of dementia as well.    I placed a referral to our social worker to see if we can further assist with placement options from home.

## 2020-11-20 NOTE — TOC Progression Note (Signed)
Transition of Care [facility]) - Progression Note    Patient Details  Name: Eduardo Vasquez MRN: [id number] Date of Birth: 1929/03/22  Transition of Care [facility]) CM/SW Contact  Boneta Lucks, RN Phone Number: 11/20/2020, 10:55 AM  Clinical Narrative:   Daughter accepted bed offer at [facility], insurance auth started yesterday, added [place] to [place].   Expected Discharge Plan: [place] Barriers to Discharge: Continued Medical Work up, SNF Pending bed offer, Ship broker  Expected Discharge Plan and Services Expected Discharge Plan: [place] In-house Referral: Clinical Social Work   Post Acute Care Choice: [place] Living arrangements for the past 2 months: Single Family Home                 DME Arranged: N/A      Readmission Risk Interventions Readmission Risk Prevention Plan 11/20/2020  Transportation Screening Complete  Home Care Screening Complete  Medication Review (RN CM) Complete  Some recent data might be hidden

## 2020-11-20 NOTE — TOC Transition Note (Signed)
Transition of Care [facility]) - CM/SW Discharge Note   Patient Details  Name: Eduardo Vasquez MRN: [id number] Date of Birth: Jan 21, 1930  Transition of Care [facility]) CM/SW Contact:  Boneta Lucks, RN Phone Number: 11/20/2020, 2:58 PM   Clinical Narrative:   Insurance auth received. TOC updated family. RN to call report to [place]. Debbie provided room number B22- Bed 2. Patient is on EMS schedule, they have no convalescent trucks today. TOC checked with RN/PT to assess for wheelchair transport. Determined to be unsafe. Patient will need EMS. Family updated that we do not have an ETA for discharge.    Final next level of care: Skilled Nursing Facility Barriers to Discharge: Barriers Resolved  Patient Goals and CMS Choice Patient states their goals for this hospitalization and ongoing recovery are:: SNF   Choice offered to / list presented to : Adult Children  Discharge Placement             Patient [facility] bed at:  [facility]) Patient to be transferred to facility by: EMS Name of family member notified: Portia Patient and family notified of of transfer: 11/20/20  Discharge Plan and Services In-house Referral: Clinical Social Work   Post Acute Care Choice: [place]          DME Arranged: N/A   Readmission Risk Interventions Readmission Risk Prevention Plan 11/20/2020  Transportation Screening Complete  Home Care Screening Complete  Medication Review (RN CM) Complete  Some recent data might be hidden

## 2020-11-20 NOTE — ED Provider Notes (Signed)
[facility] EMERGENCY DEPARTMENT Provider Note   CSN: [id number] Arrival date & time: 11/20/20  1944     History Chief Complaint  Patient presents with   Nursing Home Placement    Eduardo Vasquez is a 87 y.o. male with a history of dementia, encephalitis, presenting to the emergency department with family concerns for needing alternative rehab.  Patient was discharged today from the hospital to [facility] rehab.  He had been hospitalized for an acute stroke in the setting of dementia, with possible encephalopathy.  Per discharge summary, patient had normal EEG, noted to have dysphagia 2 (fine chop, thin liquids), and discharged to SNF w/ rec for PT/OT.  I spoke to his daughter Eduardo Vasquez by phone who reports they went to visit the SNF for the first time today with his arrival and were very unhappy with the facility.  She reports concerns for covid exposure and is also upset that his mental state continues to be confused.  She would like him evaluated and sent to a different facility.  The patient himself appears pleasantly demented on exam.  He denies headache, chest pain, shortness of breath, or abdominal pain.  He has no acute complaints.  He does exhibit some confusion.  HPI     Past Medical History:  Diagnosis Date   Cancer ([place]) 2009   Prostate   Carotid artery occlusion    Dementia ([place])    Hypercholesteremia    Hypertension    Stroke [facility]) 2003   Mini - Left Eye- Partial Blindness   Urgency incontinence     Patient Active Problem List   Diagnosis Date Noted   Encephalopathy 11/18/2020   Generalized weakness 11/17/2020   Stroke determined by clinical assessment ([place]) 11/07/2020   Acute right MCA stroke ([place]) 11/07/2020   Occlusion and stenosis of carotid artery without mention of cerebral infarction 11/03/2011    Past Surgical History:  Procedure Laterality Date   INGUINAL HERNIA REPAIR Right 12/02/2013   Procedure: HERNIA REPAIR INGUINAL ADULT WITH MESH;  Surgeon:  Jamesetta So, MD;  Location: [facility];  Service: General;  Laterality: Right;   INSERTION OF MESH Right 12/02/2013   Procedure: INSERTION OF MESH;  Surgeon: Jamesetta So, MD;  Location: [facility];  Service: General;  Laterality: Right;       Family History  Problem Relation Age of Onset   Hypertension Mother    Hyperlipidemia Mother    Hypertension Father    Hyperlipidemia Father    Diabetes Daughter    Prostate cancer Brother    Hypertension Brother     Social History   Tobacco Use   Smoking status: Never   Smokeless tobacco: Never  Substance Use Topics   Alcohol use: No    Alcohol/week: 0.0 standard drinks    Comment: 27 years alcohol free in AA   Drug use: No    Home Medications Prior to Admission medications   Medication Sig Start Date End Date Taking? Authorizing Provider  acetaminophen (TYLENOL) 325 MG tablet Take 2 tablets (650 mg total) by mouth every 6 (six) hours as needed for mild pain or fever (or Fever >/= 101). 11/20/20 12/20/20  Cassiel Fernandez, Carola Rhine, MD  amLODipine (NORVASC) 10 MG tablet Take 1 tablet (10 mg total) by mouth daily. 11/20/20 12/20/20  Wyvonnia Dusky, MD  atorvastatin (LIPITOR) 40 MG tablet Take 1 tablet (40 mg total) by mouth daily. 11/20/20 12/20/20  Wyvonnia Dusky, MD  clopidogrel (PLAVIX) 75 MG tablet Take  1 tablet (75 mg total) by mouth daily. 11/20/20 12/20/20  Wyvonnia Dusky, MD  donepezil (ARICEPT) 10 MG tablet TAKE 1 TABLET(10 MG) BY MOUTH AT BEDTIME Patient taking differently: Take 10 mg by mouth at bedtime. 02/27/17   Penumalli, Earlean Polka, MD  doxazosin (CARDURA) 4 MG tablet Take 1 tablet (4 mg total) by mouth daily. 11/20/20 12/20/20  Wyvonnia Dusky, MD  levothyroxine (SYNTHROID) 75 MCG tablet Take 1 tablet (75 mcg total) by mouth daily. 11/20/20 12/20/20  Wyvonnia Dusky, MD  memantine (NAMENDA) 10 MG tablet Take 1 tablet (10 mg total) by mouth 2 (two) times daily. 11/20/20 12/20/20  Wyvonnia Dusky, MD  Multiple Vitamin (MULTIVITAMIN) tablet  Take 1 tablet by mouth daily. 11/20/20   Wyvonnia Dusky, MD    Allergies    Patient has no known allergies.  Review of Systems   Review of Systems  Unable to perform ROS: Dementia (level 5 caveat)   Physical Exam Updated Vital Signs BP (!) 164/91 (BP Location: Right Arm)   Pulse 85   Temp 98.7 F (37.1 C) (Oral)   Resp 16   Ht 6' (1.829 m)   Wt 99.3 kg   SpO2 100%   BMI 29.69 kg/m   Physical Exam Constitutional:      General: He is not in acute distress. HENT:     Head: Normocephalic and atraumatic.  Eyes:     Conjunctiva/sclera: Conjunctivae normal.     Pupils: Pupils are equal, round, and reactive to light.  Cardiovascular:     Rate and Rhythm: Normal rate and regular rhythm.  Pulmonary:     Effort: Pulmonary effort is normal. No respiratory distress.  Abdominal:     General: There is no distension.     Tenderness: There is no abdominal tenderness.  Skin:    General: Skin is warm and dry.  Neurological:     General: No focal deficit present.     Mental Status: He is alert. Mental status is at baseline.    ED Results / Procedures / Treatments   Labs (all labs ordered are listed, but only abnormal results are displayed) Labs Reviewed  BASIC METABOLIC PANEL - Abnormal; Notable for the following components:      Result Value   Potassium 3.1 (*)    CO2 21 (*)    Glucose, Bld 110 (*)    Creatinine, Ser 1.33 (*)    Calcium 8.1 (*)    GFR, Estimated 51 (*)    All other components within normal limits  CBC - Abnormal; Notable for the following components:   Hemoglobin 11.9 (*)    HCT 37.4 (*)    RDW 15.9 (*)    All other components within normal limits    EKG None  Radiology No results found.  Procedures Procedures   Medications Ordered in ED Medications - No data to display  ED Course  I have reviewed the triage vital signs and the nursing notes.  Pertinent labs & imaging results that were available during my care of the patient were reviewed  by me and considered in my medical decision making (see chart for details).  Patient presenting by family request from SNF for placement to new facility.  On exam he appears somewhat confused, no focal complaints or neuro deficits.  He denies headache.  I reviewed his hospital records and MRI imaging from prior hospitalization.  This mental state appears consistent with his discharge state earlier this morning.  His  labs here show no evidence of new infection, dehydration, or emergency process.  I have a low suspicion for sepsis, new CVA, arrhythmia, PE, or other medical emergency at this time.   I did have a long conversation with his daughter on the phone, and explained that we do not hold patients in the ED for placement, particularly if they have already been placed in a skilled nursing facility.  She can contact the facility's case manager and request a transfer to another facility, or else bring her father home, but he does not have an indication for re-hospitalization at this time.  His family is quite upset to hear this, and I emphasize with their concerns.  They initially reported they would pick him up and take him home - I placed a SW/CM ToC consult with home health order, if this is an option, as I do think he needs a high level of care.  I was later informed by nursing staff that his family has changed their mind and requested he be transferred back to [facility].  We will make arrangements for transfer back.  Final Clinical Impression(s) / ED Diagnoses Final diagnoses:  Confusion  Follow-up visit after completion of treatment    Rx / DC Orders ED Discharge Orders          Ordered    acetaminophen (TYLENOL) 325 MG tablet  Every 6 hours PRN        11/20/20 2155    amLODipine (NORVASC) 10 MG tablet  Daily        11/20/20 2155    atorvastatin (LIPITOR) 40 MG tablet  Daily        11/20/20 2155    clopidogrel (PLAVIX) 75 MG tablet  Daily        11/20/20 2155    doxazosin  (CARDURA) 4 MG tablet  Daily        11/20/20 2155    levothyroxine (SYNTHROID) 75 MCG tablet  Daily        11/20/20 2155    memantine (NAMENDA) 10 MG tablet  2 times daily        11/20/20 2155    Multiple Vitamin (MULTIVITAMIN) tablet  Daily        11/20/20 2155             Wyvonnia Dusky, MD 11/21/20 [phone]

## 2020-11-20 NOTE — Care Management Important Message (Signed)
Important Message  Patient Details  Name: KEYDON MARCO MRN: [id number] Date of Birth: Sep 26, 1929   Medicare Important Message Given:  Yes     Boneta Lucks, RN 11/20/2020, 2:41 PM

## 2020-11-20 NOTE — Discharge Instructions (Signed)
1)Recommendations   Diet recommendations: Dysphagia 2 (fine chop);Thin liquid Liquids provided via: Cup;Straw Medication Administration: Crushed with puree Supervision: Patient able to self feed;Intermittent supervision to cue for compensatory strategies Compensations: Minimize environmental distractions;Slow rate;Small sips/bites Postural Changes and/or Swallow Maneuvers: Seated upright 90 degrees;Upright 30-60 min after meal  2)Please follow-up with Neurologist Dr. Phillips Odor-- Phone: [phone], Address: [address], [place], [place] [address] in 4 to 6 weeks for recheck and reevaluation.  Please call to make appointment with him

## 2020-11-20 NOTE — ED Notes (Signed)
Report given to Cyril Mourning, LPN at [facility]. Confirmed that patient does still have a bed at the facility.

## 2020-11-20 NOTE — ED Notes (Signed)
Attempted to call report to [facility] 6x. No answer, will continue to call.

## 2020-11-21 ENCOUNTER — Other Ambulatory Visit: Payer: Self-pay

## 2020-11-21 ENCOUNTER — Emergency Department (HOSPITAL_COMMUNITY): Payer: Medicare Other

## 2020-11-21 ENCOUNTER — Emergency Department (EMERGENCY_DEPARTMENT_HOSPITAL)
Admission: EM | Admit: 2020-11-21 | Discharge: 2020-11-22 | Disposition: A | Discharge status: Home / Self Care | Payer: Medicare Other | Source: Home / Self Care | Attending: Emergency Medicine | Admitting: Emergency Medicine

## 2020-11-21 ENCOUNTER — Encounter (HOSPITAL_COMMUNITY): Payer: Self-pay

## 2020-11-21 DIAGNOSIS — Z7902 Long term (current) use of antithrombotics/antiplatelets: Secondary | ICD-10-CM | POA: Insufficient documentation

## 2020-11-21 DIAGNOSIS — R402421 Glasgow coma scale score 9-12, in the field [EMT or ambulance]: Secondary | ICD-10-CM | POA: Diagnosis not present

## 2020-11-21 DIAGNOSIS — R791 Abnormal coagulation profile: Secondary | ICD-10-CM | POA: Insufficient documentation

## 2020-11-21 DIAGNOSIS — R4182 Altered mental status, unspecified: Secondary | ICD-10-CM | POA: Insufficient documentation

## 2020-11-21 DIAGNOSIS — Z79899 Other long term (current) drug therapy: Secondary | ICD-10-CM | POA: Insufficient documentation

## 2020-11-21 DIAGNOSIS — Z8546 Personal history of malignant neoplasm of prostate: Secondary | ICD-10-CM | POA: Insufficient documentation

## 2020-11-21 DIAGNOSIS — I63511 Cerebral infarction due to unspecified occlusion or stenosis of right middle cerebral artery: Secondary | ICD-10-CM | POA: Diagnosis present

## 2020-11-21 DIAGNOSIS — G459 Transient cerebral ischemic attack, unspecified: Secondary | ICD-10-CM | POA: Diagnosis not present

## 2020-11-21 DIAGNOSIS — J690 Pneumonitis due to inhalation of food and vomit: Secondary | ICD-10-CM | POA: Diagnosis not present

## 2020-11-21 DIAGNOSIS — G9340 Encephalopathy, unspecified: Secondary | ICD-10-CM | POA: Diagnosis present

## 2020-11-21 DIAGNOSIS — I10 Essential (primary) hypertension: Secondary | ICD-10-CM

## 2020-11-21 DIAGNOSIS — Z743 Need for continuous supervision: Secondary | ICD-10-CM | POA: Diagnosis not present

## 2020-11-21 DIAGNOSIS — R059 Cough, unspecified: Secondary | ICD-10-CM | POA: Diagnosis not present

## 2020-11-21 DIAGNOSIS — R6889 Other general symptoms and signs: Secondary | ICD-10-CM | POA: Diagnosis not present

## 2020-11-21 DIAGNOSIS — R404 Transient alteration of awareness: Secondary | ICD-10-CM | POA: Diagnosis not present

## 2020-11-21 DIAGNOSIS — F0390 Unspecified dementia without behavioral disturbance: Secondary | ICD-10-CM | POA: Insufficient documentation

## 2020-11-21 LAB — COMPREHENSIVE METABOLIC PANEL
ALT: 21 U/L (ref 0–44)
AST: 35 U/L (ref 15–41)
Albumin: 3.4 g/dL — ABNORMAL LOW (ref 3.5–5.0)
Alkaline Phosphatase: 89 U/L (ref 38–126)
Anion gap: 6 (ref 5–15)
BUN: 12 mg/dL (ref 8–23)
CO2: 24 mmol/L (ref 22–32)
Calcium: 8.3 mg/dL — ABNORMAL LOW (ref 8.9–10.3)
Chloride: 107 mmol/L (ref 98–111)
Creatinine, Ser: 1.25 mg/dL — ABNORMAL HIGH (ref 0.61–1.24)
GFR, Estimated: 55 mL/min — ABNORMAL LOW (ref >60)
Glucose, Bld: 96 mg/dL (ref 70–99)
Potassium: 3.4 mmol/L — ABNORMAL LOW (ref 3.5–5.1)
Sodium: 137 mmol/L (ref 135–145)
Total Bilirubin: 0.9 mg/dL (ref 0.3–1.2)
Total Protein: 7.4 g/dL (ref 6.5–8.1)

## 2020-11-21 LAB — DIFFERENTIAL
Abs Immature Granulocytes: 0.03 10*3/uL (ref 0.00–0.07)
Basophils Absolute: 0.1 10*3/uL (ref 0.0–0.1)
Basophils Relative: 1 %
Eosinophils Absolute: 0.2 10*3/uL (ref 0.0–0.5)
Eosinophils Relative: 2 %
Immature Granulocytes: 0 %
Lymphocytes Relative: 16 %
Lymphs Abs: 1.3 10*3/uL (ref 0.7–4.0)
Monocytes Absolute: 0.7 10*3/uL (ref 0.1–1.0)
Monocytes Relative: 9 %
Neutro Abs: 5.7 10*3/uL (ref 1.7–7.7)
Neutrophils Relative %: 72 %

## 2020-11-21 LAB — URINALYSIS, ROUTINE W REFLEX MICROSCOPIC
Glucose, UA: NEGATIVE mg/dL
Hgb urine dipstick: NEGATIVE
Ketones, ur: 40 mg/dL — AB
Leukocytes,Ua: NEGATIVE
Nitrite: NEGATIVE
Specific Gravity, Urine: 1.025 (ref 1.005–1.030)
pH: 6 (ref 5.0–8.0)

## 2020-11-21 LAB — URINALYSIS, MICROSCOPIC (REFLEX)

## 2020-11-21 LAB — RAPID URINE DRUG SCREEN, HOSP PERFORMED
Amphetamines: NOT DETECTED
Barbiturates: NOT DETECTED
Benzodiazepines: NOT DETECTED
Cocaine: NOT DETECTED
Opiates: NOT DETECTED
Tetrahydrocannabinol: NOT DETECTED

## 2020-11-21 LAB — I-STAT CHEM 8, ED
BUN: 10 mg/dL (ref 8–23)
Calcium, Ion: 1.1 mmol/L — ABNORMAL LOW (ref 1.15–1.40)
Chloride: 106 mmol/L (ref 98–111)
Creatinine, Ser: 1.3 mg/dL — ABNORMAL HIGH (ref 0.61–1.24)
Glucose, Bld: 95 mg/dL (ref 70–99)
HCT: 39 % (ref 39.0–52.0)
Hemoglobin: 13.3 g/dL (ref 13.0–17.0)
Potassium: 3.5 mmol/L (ref 3.5–5.1)
Sodium: 141 mmol/L (ref 135–145)
TCO2: 23 mmol/L (ref 22–32)

## 2020-11-21 LAB — APTT: aPTT: 29 seconds (ref 24–36)

## 2020-11-21 LAB — CBC
HCT: 37.2 % — ABNORMAL LOW (ref 39.0–52.0)
Hemoglobin: 12.1 g/dL — ABNORMAL LOW (ref 13.0–17.0)
MCH: 27.3 pg (ref 26.0–34.0)
MCHC: 32.5 g/dL (ref 30.0–36.0)
MCV: 84 fL (ref 80.0–100.0)
Platelets: 205 10*3/uL (ref 150–400)
RBC: 4.43 MIL/uL (ref 4.22–5.81)
RDW: 16 % — ABNORMAL HIGH (ref 11.5–15.5)
WBC: 7.9 10*3/uL (ref 4.0–10.5)
nRBC: 0 % (ref 0.0–0.2)

## 2020-11-21 LAB — PROTIME-INR
INR: 1.1 (ref 0.8–1.2)
Prothrombin Time: 14.6 seconds (ref 11.4–15.2)

## 2020-11-21 LAB — CBG MONITORING, ED: Glucose-Capillary: 149 mg/dL — ABNORMAL HIGH (ref 70–99)

## 2020-11-21 MED ORDER — AZITHROMYCIN 200 MG/5ML PO SUSR: 500.0000 mg | Freq: Every day | ORAL | 0 refills | Status: AC

## 2020-11-21 MED ORDER — ACETAMINOPHEN 160 MG/5ML PO ELIX: 640.0000 mg | ORAL_SOLUTION | ORAL | 0 refills | Status: AC | PRN

## 2020-11-21 MED ORDER — GUAIFENESIN 100 MG/5ML PO SOLN: 10.0000 mL | ORAL | 0 refills | Status: AC | PRN

## 2020-11-21 MED ORDER — CEFDINIR 250 MG/5ML PO SUSR: 300.0000 mg | Freq: Two times a day (BID) | ORAL | 0 refills | Status: AC

## 2020-11-21 MED ORDER — SODIUM CHLORIDE 0.9 % IV SOLN
1.0000 g | Freq: Once | INTRAVENOUS | Status: AC
  Administered 2020-11-21: 1 g via INTRAVENOUS
  Filled 2020-11-21: qty 10

## 2020-11-21 MED ORDER — SODIUM CHLORIDE 0.9 % IV SOLN
500.0000 mg | Freq: Once | INTRAVENOUS | Status: AC
  Administered 2020-11-21: 500 mg via INTRAVENOUS
  Filled 2020-11-21: qty 500

## 2020-11-21 MED ORDER — DEXTROSE-NACL 5-0.45 % IV SOLN: INTRAVENOUS | Status: AC

## 2020-11-21 MED ORDER — LABETALOL HCL 5 MG/ML IV SOLN: 10.0000 mg | INTRAVENOUS | Status: DC | PRN

## 2020-11-21 NOTE — Discharge Instructions (Signed)
1)Recommendations     Diet recommendations: Dysphagia 1 (pured);Thin liquid Liquids provided via: Cup;Straw Medication Administration: Crushed with puree Supervision: Patient able to self feed;Intermittent supervision to cue for compensatory strategies Compensations: Minimize environmental distractions;Slow rate;Small sips/bites Postural Changes and/or Swallow Maneuvers: Seated upright 90 degrees;Upright 30-60 min after meal   2)Please follow-up with Neurologist Dr. Phillips Odor-- Phone: [phone], Address: [address], [place], [place] [address] in 4 to 6 weeks for recheck and reevaluation.  Please call to make appointment with him  3) if patient fails to improve with rehab/therapy--- please consider referral to Hospice to help keep patient comfortable and to preserve patient's dignity (patient is almost 87 years old with advanced dementia and recurrent strokes--- overall prognosis is poor)

## 2020-11-21 NOTE — Progress Notes (Signed)
  Disposition-: --Plan of care discussed with patient's daughters at bedside  Ms. Eduardo Vasquez and also patient's daughter Ms. Eduardo Vasquez who lives in [place] but is present here today - Family agreeable with discharge back to [facility] -Transition of care/social worker working with SNF with tentative transfer back to [facility] on Sunday, 11/22/2020 -Patient does Not currently meet criteria for readmission to inpatient service -Patient will stay in the ED until transfer back to [facility] SNF on [id number]  Roxan Hockey, MD

## 2020-11-21 NOTE — TOC Progression Note (Signed)
Transition of Care [facility]) - Progression Note    Patient Details  Name: Eduardo Vasquez MRN: [id number] Date of Birth: 1929-06-25  Transition of Care [facility]) CM/SW Contact  Natasha Bence, LCSW Phone Number: 11/21/2020, 4:39 PM  Clinical Narrative:    CSW notified of patient's family's complaints with SNF and reason for return. CSW consulted TOC supervisor. TOC supervisor reported that CSW could work to identify other SNF placements. MD and RN reported that family now agreeable to return to [facility] due to [facility] visitor policy. CSW confirmed visitor policy with Jackelyn Poling from [facility]. Debbie reported that they will be able to take patient on 09/11 and will not be able to take patient today due to not being able to get medications filled prior to patient's admission to facility on 09/10. TOC to follow.         Expected Discharge Plan and Services           Expected Discharge Date: 11/21/20                                     Social Determinants of Health (SDOH) Interventions    Readmission Risk Interventions Readmission Risk Prevention Plan 11/20/2020  Transportation Screening Complete  Home Care Screening Complete  Medication Review (RN CM) Complete  Some recent data might be hidden

## 2020-11-21 NOTE — ED Provider Notes (Signed)
[facility] EMERGENCY DEPARTMENT Provider Note   CSN: [id number] Arrival date & time: 11/21/20  1003     History Chief Complaint  Patient presents with   Altered Mental Status    Eduardo Vasquez is a 87 y.o. male.   Altered Mental Status  This patient is an 87 year old male with a history of a prior stroke hypertension hypercholesterolemia, recently diagnosed encephalopathy and dementia who has had a very complicated course over the last month including several emergency department visits, thought to have amyloid angiopathy, found to have several new strokes and ultimately was admitted to the hospital several times and even was administered thrombolytic therapy.  He was brought to the hospital yesterday after he was discharged from the hospital and went to [place], within hours of being there the family requested that he be sent back to the hospital for evaluation for alternative placement.  They were informed that this was not an emergency department function and that they could work with the social worker onsite to have this done, they ultimately elected to have him go back to that facility.  At that time per the documentation the patient was alert and able to follow commands though confused and disoriented per baseline regards to dementia.  Today the patient was found to be significantly altered, unable to talk, there was concern that he had had another stroke, he was severely hypertensive measured over [id number] systolic, over [id number] diastolic and was found to be in distress with regards to mental status, EMS was called to transport the patient back to the hospital for repeat evaluation.  Blood sugar was noted to be just over 100 by EMS.  The patient does not verbal at this time, level 5 caveat applies  Past Medical History:  Diagnosis Date   Cancer ([place]) 2009   Prostate   Carotid artery occlusion    Dementia ([place])    Hypercholesteremia    Hypertension    Stroke [facility])  2003   Mini - Left Eye- Partial Blindness   Urgency incontinence     Patient Active Problem List   Diagnosis Date Noted   Dementia without behavioral disturbance ([place]) 11/21/2020   Encephalopathy 11/18/2020   Generalized weakness 11/17/2020   Stroke determined by clinical assessment ([place]) 11/07/2020   Acute right MCA stroke ([place]) 11/07/2020   Occlusion and stenosis of carotid artery without mention of cerebral infarction 11/03/2011    Past Surgical History:  Procedure Laterality Date   INGUINAL HERNIA REPAIR Right 12/02/2013   Procedure: HERNIA REPAIR INGUINAL ADULT WITH MESH;  Surgeon: Jamesetta So, MD;  Location: [facility];  Service: General;  Laterality: Right;   INSERTION OF MESH Right 12/02/2013   Procedure: INSERTION OF MESH;  Surgeon: Jamesetta So, MD;  Location: [facility];  Service: General;  Laterality: Right;       Family History  Problem Relation Age of Onset   Hypertension Mother    Hyperlipidemia Mother    Hypertension Father    Hyperlipidemia Father    Diabetes Daughter    Prostate cancer Brother    Hypertension Brother     Social History   Tobacco Use   Smoking status: Never   Smokeless tobacco: Never  Substance Use Topics   Alcohol use: No    Alcohol/week: 0.0 standard drinks    Comment: 27 years alcohol free in AA   Drug use: No    Home Medications Prior to Admission medications   Medication Sig Start Date End  Date Taking? Authorizing Provider  acetaminophen (TYLENOL) 325 MG tablet Take 2 tablets (650 mg total) by mouth every 6 (six) hours as needed for mild pain or fever (or Fever >/= 101). 11/20/20 12/20/20  Trifan, Carola Rhine, MD  amLODipine (NORVASC) 10 MG tablet Take 1 tablet (10 mg total) by mouth daily. 11/20/20 12/20/20  Wyvonnia Dusky, MD  atorvastatin (LIPITOR) 40 MG tablet Take 1 tablet (40 mg total) by mouth daily. 11/20/20 12/20/20  Wyvonnia Dusky, MD  clopidogrel (PLAVIX) 75 MG tablet Take 1 tablet (75 mg total) by mouth daily. 11/20/20  12/20/20  Wyvonnia Dusky, MD  donepezil (ARICEPT) 10 MG tablet TAKE 1 TABLET(10 MG) BY MOUTH AT BEDTIME Patient taking differently: Take 10 mg by mouth at bedtime. 02/27/17   Penumalli, Earlean Polka, MD  doxazosin (CARDURA) 4 MG tablet Take 1 tablet (4 mg total) by mouth daily. 11/20/20 12/20/20  Wyvonnia Dusky, MD  levothyroxine (SYNTHROID) 75 MCG tablet Take 1 tablet (75 mcg total) by mouth daily. 11/20/20 12/20/20  Wyvonnia Dusky, MD  memantine (NAMENDA) 10 MG tablet Take 1 tablet (10 mg total) by mouth 2 (two) times daily. 11/20/20 12/20/20  Wyvonnia Dusky, MD  Multiple Vitamin (MULTIVITAMIN) tablet Take 1 tablet by mouth daily. 11/20/20   Wyvonnia Dusky, MD    Allergies    Patient has no known allergies.  Review of Systems   Review of Systems  All other systems reviewed and are negative.  Physical Exam Updated Vital Signs BP (!) 164/86 (BP Location: Left Arm)   Pulse 64   Temp 98.7 F (37.1 C) (Oral)   Resp 15   Ht 1.829 m (6')   Wt 99.3 kg   SpO2 99%   BMI 29.69 kg/m   Physical Exam Vitals and nursing note reviewed.  Constitutional:      Appearance: He is well-developed.     Comments: The patient is obtunded, he is able to mumble, he does not open his eyes spontaneously  HENT:     Head: Normocephalic and atraumatic.     Nose: No congestion or rhinorrhea.     Mouth/Throat:     Comments: The patient opens his mouth minimally to request, mucous membranes appear normal.  Minimal visualization Eyes:     General: No scleral icterus.       Right eye: No discharge.        Left eye: No discharge.     Conjunctiva/sclera: Conjunctivae normal.     Pupils: Pupils are equal, round, and reactive to light.     Comments: Pupils are 2 mm and symmetric  Neck:     Thyroid: No thyromegaly.     Vascular: No JVD.  Cardiovascular:     Rate and Rhythm: Normal rate and regular rhythm.     Heart sounds: Normal heart sounds. No murmur heard.   No friction rub. No gallop.  Pulmonary:      Effort: Pulmonary effort is normal. No respiratory distress.     Breath sounds: Normal breath sounds. No wheezing or rales.  Abdominal:     General: Bowel sounds are normal. There is no distension.     Palpations: Abdomen is soft. There is no mass.     Tenderness: There is no abdominal tenderness.  Musculoskeletal:        General: No tenderness. Normal range of motion.     Cervical back: Normal range of motion and neck supple.     Right lower leg: No  edema.     Left lower leg: No edema.  Lymphadenopathy:     Cervical: No cervical adenopathy.  Skin:    General: Skin is warm and dry.     Findings: No erythema or rash.  Neurological:     Comments: The patient mumbles but does not speak, he is able to lift both arms but is severely weak at 3-1/2 to 4 out of 5 bilaterally, he does not move either leg, he does not open his eyes, he will open his mouth a little bit when I ask.  His grips are severely weak but he is able to grip when I ask.  Psychiatric:        Behavior: Behavior normal.    ED Results / Procedures / Treatments   Labs (all labs ordered are listed, but only abnormal results are displayed) Labs Reviewed  CBC - Abnormal; Notable for the following components:      Result Value   Hemoglobin 12.1 (*)    HCT 37.2 (*)    RDW 16.0 (*)    All other components within normal limits  COMPREHENSIVE METABOLIC PANEL - Abnormal; Notable for the following components:   Potassium 3.4 (*)    Creatinine, Ser 1.25 (*)    Calcium 8.3 (*)    Albumin 3.4 (*)    GFR, Estimated 55 (*)    All other components within normal limits  I-STAT CHEM 8, ED - Abnormal; Notable for the following components:   Creatinine, Ser 1.30 (*)    Calcium, Ion 1.10 (*)    All other components within normal limits  PROTIME-INR  APTT  DIFFERENTIAL  RAPID URINE DRUG SCREEN, HOSP PERFORMED  URINALYSIS, ROUTINE W REFLEX MICROSCOPIC    EKG None  Radiology CT HEAD WO CONTRAST (5MM)  Result Date:  11/21/2020 CLINICAL DATA:  87 year old male with mental status changes EXAM: CT HEAD WITHOUT CONTRAST TECHNIQUE: Contiguous axial images were obtained from the base of the skull through the vertex without intravenous contrast. COMPARISON:  MR 11/08/2020, 11/18/2020, prior CT 11/17/2020, 11/11/2020, 11/07/2020 FINDINGS: Brain: Focal hypodensity in the right periventricular corona radiata, corresponding to changes on recent MRI studies. Similar appearance of the ventricles. Similar appearance of changes of the periventricular white matter. No midline shift. No intracranial hemorrhage. No mass effect. Vascular: Calcifications of the anterior circulation. Skull: No acute fracture. Sinuses/Orbits: Unremarkable appearance of the orbits. No paranasal sinus disease. Other: None IMPRESSION: Focal hypodensity in the right periventricular region corresponding to evolution of recent infarct identified on MRI. Chronic white matter disease and associated intracranial atherosclerosis. Electronically Signed   By: Corrie Mckusick D.O.   On: 11/21/2020 11:07   DG Chest Port 1 View  Result Date: 11/21/2020 CLINICAL DATA:  Cough. EXAM: PORTABLE CHEST 1 VIEW COMPARISON:  November 17, 2020 FINDINGS: The heart size and mediastinal contours are stable. The heart size is mildly enlarged. Mild patchy opacities identified in the medial left lung base. There is no pulmonary edema or pleural effusion. The visualized skeletal structures are stable. IMPRESSION: Mild patchy opacities identified in the medial left lung base, developing pneumonia is not excluded. Electronically Signed   By: Abelardo Diesel M.D.   On: 11/21/2020 11:49    Procedures Procedures   Medications Ordered in ED Medications  azithromycin (ZITHROMAX) 500 mg in sodium chloride 0.9 % 250 mL IVPB (has no administration in time range)  cefTRIAXone (ROCEPHIN) 1 g in sodium chloride 0.9 % 100 mL IVPB (1 g Intravenous New Bag/Given 11/21/20 1433)  ED Course  I have  reviewed the triage vital signs and the nursing notes.  Pertinent labs & imaging results that were available during my care of the patient were reviewed by me and considered in my medical decision making (see chart for details).    MDM Rules/Calculators/A&P                           The patient has an acute on chronic encephalopathy, he is severely hypertensive, rechecked on arrival at 182/88.  According to the medical record he is only on amlodipine 10 mg daily, Cardura 4 mg daily, he is also on Plavix 75 mg daily.  Will obtain CT scan of the brain, labs, anticipate that this patient will likely need to be readmitted for acute encephalopathy in the setting of severe hypertension.  Hopefully no hemorrhage is seen.  I have discussed the care with the family member who is at the bedside, this is his daughter, she is upset with his current situation at the facility, she continues to state that she wants the patient transferred, she is willing to wait for results to see where this patient would need to be placed with her here or at an outside facility.  I discussed the care with the [facility] - 220/125 this morning, he was "not supposed to be at this facility" - b/c they have covid in the building.  They state that because the family did not want the patient to be there they never ordered the medications thus the patient has not had his daily medications since arrival  The blood pressure has resolved and is currently 164/86, this is much more reasonable.  The patient is otherwise at his baseline, there is a possible pneumonia, I discussed the case with Dr. Roxan Hockey who has been kind enough to become involved and discussed with the family the options that are available including the possible need for advancement to hospice.  At this time he will discharge the patient back to the facility where the patient is currently accepted, awaiting convalescent transport as the patient is not able to go back by  wheelchair.  I do appreciate the expert care given by my colleague Dr. Denton Brick and his willingness to lend his expertise today.  Dr. Denton Brick to coordinate the d/c  Final Clinical Impression(s) / ED Diagnoses Final diagnoses:  Primary hypertension  Aspiration pneumonia of left lower lobe, unspecified aspiration pneumonia type [facility])     Noemi Chapel, MD 11/21/20 1511

## 2020-11-21 NOTE — ED Notes (Signed)
Male pure wick placed on patient and patient cleaned.  Bed linens changed.

## 2020-11-21 NOTE — ED Notes (Signed)
Checked on pt. He was asleep. And the daughter is at his bed side.

## 2020-11-21 NOTE — ED Triage Notes (Signed)
Pt brought in by EMS for HTN, and weakness per family.

## 2020-11-21 NOTE — Consult Note (Signed)
AINSLEY BEAVERSON, is a 87 y.o. male  DOB 1929/07/10  MRN [id number].  Admission date:  11/21/2020  Admitting Physician  No admitting provider for patient encounter.  Discharge Date:  11/21/2020   Primary MD  Rosita Fire, MD  Recommendations for primary care physician for things to follow:   1)Recommendations     Diet recommendations: Dysphagia 1 (pured);Thin liquid Liquids provided via: Cup;Straw Medication Administration: Crushed with puree Supervision: Patient able to self feed;Intermittent supervision to cue for compensatory strategies Compensations: Minimize environmental distractions;Slow rate;Small sips/bites Postural Changes and/or Swallow Maneuvers: Seated upright 90 degrees;Upright 30-60 min after meal   2)Please follow-up with Neurologist Dr. Phillips Odor-- Phone: [phone], Address: [address], [place], [place] [address] in 4 to 6 weeks for recheck and reevaluation.  Please call to make appointment with him  3) if patient fails to improve with rehab/therapy--- please consider referral to Hospice to help keep patient comfortable and to preserve patient's dignity --Patient is almost 87 years old with advanced dementia and recurrent strokes--- overall prognosis is poor especially given nutritional/swallowing challenges   Admission Diagnosis  HTN  Discharge Diagnosis  HTN    Principal Problem:   Acute right MCA stroke ([place]) Active Problems:   Dementia without behavioral disturbance ([place])   Encephalopathy      Past Medical History:  Diagnosis Date   Cancer ([place]) 2009   Prostate   Carotid artery occlusion    Dementia ([place])    Hypercholesteremia    Hypertension    Stroke [facility]) 2003   Mini - Left Eye- Partial Blindness   Urgency incontinence     Past Surgical History:  Procedure Laterality Date   INGUINAL HERNIA REPAIR Right 12/02/2013   Procedure: HERNIA REPAIR  INGUINAL ADULT WITH MESH;  Surgeon: Jamesetta So, MD;  Location: [facility];  Service: General;  Laterality: Right;   INSERTION OF MESH Right 12/02/2013   Procedure: INSERTION OF MESH;  Surgeon: Jamesetta So, MD;  Location: [facility];  Service: General;  Laterality: Right;       HPI  from the history and physical done on the day of admission:    Chief Complaint: Weakness   HPI: ELLERY WIDER is a 87 y.o. male with medical history significant for CVA, dementia, HTN.  Brought to the ED reports of generalized weakness.  History from patient is limited, he is awake alert, but not able to answer questions.  Daughter present at bedside assist with the history, says over the past few days patient has not been able to walk, requiring lots of assistance.   Patient was recently admitted 8/27-8/29, came in with left-sided weakness and slurred speech, transferred to [facility] under neurology service for large right basal infarct of cryptogenic etiology likely cardioembolic.  NIHSS was 6, patient was determined to be a candidate for IV tPA and this was given.  Further work-up with event monitor deferred as he is not a good candidate for anticoagulation due to dementia and microhemorrhages that were  noted on MRI brain.   Patient was seen for receiving physical therapy at home today, and provider felt patient was significantly different from when he was seen after hospitalization.   Daughter denies confusion to me, but reports poor oral intake over the past 2 days, not swallowing.  Also reports of a cough over the past 2 days.  Patient was brought to the ED 8/31, after hospital discharge, for staring spells, head CT and work-up was unremarkable, he was back to his baseline so he was discharged from the ED.  Daughter is unaware of any further staring spells since then.   Patient has baseline dementia, mostly involving short-term memory, he is able to recognize family.   ED Course: Temp 98.9.  Heart rate 60s.   Blood pressure 123456 systolic.  O2 sats 98-100 on room air, potassium 3.2.  WBC 6.7.  Creatinine stable 1.43.  Head CT without acute abnormality. EDP talked to neurologist Dr. Curly Shores, recommended admission, MRI, EEG.     Hospital Course:     Brief Summary:- 87 y.o. male  with past medical history of  CVA, advanced dementia with significant cognitive and memory deficits,, HTN/HLD, h/o prostate cancer, carotid artery occlusion and left eye partial blindness attributed to stroke as well as history of  generalized weakness due to recurrent acute CVA-with dysphagia evaluated in the ED on 11/21/2020 after being sent over from [facility] at request of family --He was initially readmitted on 11/17/2020 to [facility], discharged on [id number] to [facility]  A/p 1)Social/Ethics- if patient fails to improve with rehab/therapy--- please consider referral to Hospice to help keep patient comfortable and to preserve patient's dignity  -Patient is almost 87 years old with advanced dementia and recurrent strokes--- overall prognosis is poor especially given nutritional/swallowing challenges -Palliative consult appreciated patient remains a full code -Plan of care discussed with patient's daughters at bedside  Ms. Tobi Bastos and also patient's daughter Ms. Federico Flake who lives in [place] but is present here today  2)-Generalized weakness due to recurrent acute CVA-   stuttering lacunar stroke Vs cryptogenic stroke from embolic phenomena, though he does not have significant intracranial atherosclerosis on MRA -Further cardiac work up with cardiac event monitor deferred given he is not a good candidate for anticoagulation due to dementia and microhemorrhages noted on MRI Brain  CT without Acute abnormality. - Neurology consult appreciated --MRI showed recurrent CVA.  EEG was negative.   As per  Dr. Curly Shores from neuro  recommending plavix and lipitor - PT/OT-SNF - UA- neg - TSH, B12, folate-wnl -LDL 82 and  hemoglobin A1c 6.4 -Repeat CT head without contrast on 11/21/2020 showed focal hypodensity in the right periventricular region corresponding to evolution of recent infarct identified on MRI--without additional new findings -Please see full neurology consult note and recommendations dated 11/19/2020 listed under the heading-plan of care by neurologist Dr. Curly Shores   3)HTN-okay to restart Norvasc, patient apparently missed BP meds due to being transferred from SNF to ED  4)Possible Lt sided PNA--chest x-ray on 11/21/2020 showed possible left-sided pneumonia -Patient is without fevers or leukocytosis and no tachycardia -Does Not meet sepsis criteria -Patient received IV Rocephin and azithromycin in the ED -Okay to discharge on Omnicef and azithromycin orally -No hypoxia and no respiratory distress at this time  5)Diet-  speech eval appreciated -Recommendations     Diet recommendations: Dysphagia 1 (pured);Thin liquid Liquids provided via: Cup;Straw Medication Administration: Crushed with puree Supervision: Patient able to self feed;Intermittent supervision to cue for  compensatory strategies Compensations: Minimize environmental distractions;Slow rate;Small sips/bites Postural Changes and/or Swallow Maneuvers: Seated upright 90 degrees;Upright 30-60 min after meal   6)Dementia--- okay to start Aricept and Namenda   7)Hypothyroidism--- continue levothyroxine   Discharge Condition: stable medically with poor prognosis  Follow UP--- outpatient Neurology and possible hospice consultation depending on progress with rehab  Diet and Activity recommendation:  As advised  Disposition-:Family agreeable with discharge back to [facility] -Transition of care/social worker working with SNF with tentative transfer back to [facility] on Sunday, 11/22/2020 -Patient does Not currently meet criteria for readmission to inpatient service -Patient will stay in the ED until transfer back to [facility] on  [id number]  Discharge Instructions    Discharge Instructions     Call MD for:  difficulty breathing, headache or visual disturbances   Complete by: As directed    Call MD for:  persistant dizziness or light-headedness   Complete by: As directed    Call MD for:  persistant nausea and vomiting   Complete by: As directed    Call MD for:  temperature >100.4   Complete by: As directed    Diet - low sodium heart healthy   Complete by: As directed    Pureed Solids   Discharge instructions   Complete by: As directed    1)Recommendations     Diet recommendations: Dysphagia 1 (pured);Thin liquid Liquids provided via: Cup;Straw Medication Administration: Crushed with puree Supervision: Patient able to self feed;Intermittent supervision to cue for compensatory strategies Compensations: Minimize environmental distractions;Slow rate;Small sips/bites Postural Changes and/or Swallow Maneuvers: Seated upright 90 degrees;Upright 30-60 min after meal   2)Please follow-up with Neurologist Dr. Phillips Odor-- Phone: [phone], Address: [address], [place], [place] [address] in 4 to 6 weeks for recheck and reevaluation.  Please call to make appointment with him  3) if patient fails to improve with rehab/therapy--- please consider referral to Hospice to help keep patient comfortable and to preserve patient's dignity (patient is almost 87 years old with advanced dementia and recurrent strokes--- overall prognosis is poor)   Increase activity slowly   Complete by: As directed          Discharge Medications     Allergies as of 11/21/2020   No Known Allergies      Medication List     TAKE these medications    acetaminophen 325 MG tablet Commonly known as: TYLENOL Take 2 tablets (650 mg total) by mouth every 6 (six) hours as needed for mild pain or fever (or Fever >/= 101). What changed: Another medication with the same name was added. Make sure you understand how and when to  take each.   acetaminophen 160 MG/5ML elixir Commonly known as: TYLENOL Take 20 mLs (640 mg total) by mouth every 4 (four) hours as needed for fever or pain. What changed: You were already taking a medication with the same name, and this prescription was added. Make sure you understand how and when to take each.   amLODipine 10 MG tablet Commonly known as: NORVASC Take 1 tablet (10 mg total) by mouth daily.   atorvastatin 40 MG tablet Commonly known as: LIPITOR Take 1 tablet (40 mg total) by mouth daily.   azithromycin 200 MG/5ML suspension Commonly known as: ZITHROMAX Take 12.5 mLs (500 mg total) by mouth daily for 5 days.   cefdinir 250 MG/5ML suspension Commonly known as: OMNICEF Take 6 mLs (300 mg total) by mouth 2 (two) times daily for 5  days. Start taking on: November 22, 2020   clopidogrel 75 MG tablet Commonly known as: PLAVIX Take 1 tablet (75 mg total) by mouth daily.   donepezil 10 MG tablet Commonly known as: ARICEPT TAKE 1 TABLET(10 MG) BY MOUTH AT BEDTIME What changed: See the new instructions.   doxazosin 4 MG tablet Commonly known as: CARDURA Take 1 tablet (4 mg total) by mouth daily.   guaiFENesin 100 MG/5ML Soln Commonly known as: ROBITUSSIN Take 10 mLs (200 mg total) by mouth every 4 (four) hours as needed for cough or to loosen phlegm.   levothyroxine 75 MCG tablet Commonly known as: SYNTHROID Take 1 tablet (75 mcg total) by mouth daily.   memantine 10 MG tablet Commonly known as: NAMENDA Take 1 tablet (10 mg total) by mouth 2 (two) times daily.   multivitamin tablet Take 1 tablet by mouth daily.        Major procedures and Radiology Reports - PLEASE review detailed and final reports for all details, in brief -    DG Chest 1 View  Result Date: 11/07/2020 CLINICAL DATA:  Stroke/hx prostate ca/dementia/htn/non smoker Covid neg today EXAM: CHEST  1 VIEW COMPARISON:  None. FINDINGS: The heart and mediastinal contours are within normal  limits. No focal consolidation. No pulmonary edema. No pleural effusion. No pneumothorax. No acute osseous abnormality. IMPRESSION: No active disease. Electronically Signed   By: Iven Finn M.D.   On: 11/07/2020 15:10   CT HEAD WO CONTRAST (5MM)  Result Date: 11/21/2020 CLINICAL DATA:  87 year old male with mental status changes EXAM: CT HEAD WITHOUT CONTRAST TECHNIQUE: Contiguous axial images were obtained from the base of the skull through the vertex without intravenous contrast. COMPARISON:  MR 11/08/2020, 11/18/2020, prior CT 11/17/2020, 11/11/2020, 11/07/2020 FINDINGS: Brain: Focal hypodensity in the right periventricular corona radiata, corresponding to changes on recent MRI studies. Similar appearance of the ventricles. Similar appearance of changes of the periventricular white matter. No midline shift. No intracranial hemorrhage. No mass effect. Vascular: Calcifications of the anterior circulation. Skull: No acute fracture. Sinuses/Orbits: Unremarkable appearance of the orbits. No paranasal sinus disease. Other: None IMPRESSION: Focal hypodensity in the right periventricular region corresponding to evolution of recent infarct identified on MRI. Chronic white matter disease and associated intracranial atherosclerosis. Electronically Signed   By: Corrie Mckusick D.O.   On: 11/21/2020 11:07   CT HEAD WO CONTRAST (5MM)  Result Date: 11/17/2020 CLINICAL DATA:  Mental status change, unknown cause EXAM: CT HEAD WITHOUT CONTRAST TECHNIQUE: Contiguous axial images were obtained from the base of the skull through the vertex without intravenous contrast. COMPARISON:  11/11/2020 FINDINGS: Brain: No evidence of acute infarction, hemorrhage, hydrocephalus, extra-axial collection or mass lesion/mass effect. Moderate low-density changes within the periventricular and subcortical white matter compatible with chronic microvascular ischemic change. Mild diffuse cerebral volume loss. Vascular: Atherosclerotic  calcifications involving the large vessels of the skull base. No unexpected hyperdense vessel. Skull: Normal. Negative for fracture or focal lesion. Sinuses/Orbits: No acute finding. Other: None. IMPRESSION: 1. No acute intracranial findings. 2. Chronic microvascular ischemic change and cerebral volume loss. Electronically Signed   By: Davina Poke D.O.   On: 11/17/2020 15:43   CT HEAD WO CONTRAST (5MM)  Result Date: 11/11/2020 CLINICAL DATA:  Neurologic deficit. EXAM: CT HEAD WITHOUT CONTRAST TECHNIQUE: Contiguous axial images were obtained from the base of the skull through the vertex without intravenous contrast. COMPARISON:  Head CT dated 11/07/2020. FINDINGS: Brain: Moderate age-related atrophy and chronic microvascular ischemic changes. There is no  acute intracranial hemorrhage. No mass effect or midline shift no extra-axial fluid collection Vascular: No hyperdense vessel or unexpected calcification. Skull: Normal. Negative for fracture or focal lesion. Sinuses/Orbits: No acute finding. Other: None IMPRESSION: 1. No acute intracranial pathology. 2. Moderate age-related atrophy and chronic microvascular ischemic changes. Electronically Signed   By: Anner Crete M.D.   On: 11/11/2020 22:41   MR ANGIO HEAD WO CONTRAST  Result Date: 11/18/2020 CLINICAL DATA:  Generalized weakness, unable to walk, confusion, recent CVA received IV TPA. EXAM: MRI HEAD WITHOUT CONTRAST MRA HEAD WITHOUT CONTRAST TECHNIQUE: Multiplanar, multi-echo pulse sequences of the brain and surrounding structures were acquired without intravenous contrast. Angiographic images of the Circle of Willis were acquired using MRA technique without intravenous contrast. COMPARISON:  Head CT 11/17/2020 and MRI 11/08/2020 FINDINGS: MRI HEAD FINDINGS Brain: There is restricted diffusion involving the right basal ganglia (caudate body and lentiform nucleus) and corona radiata which is in the same location as the acute infarct on the prior MRI,  however the extent of the diffusion abnormality today is greater than on the prior MRI with more intensely restricted diffusion consistent with recurrent acute ischemia. Superficial siderosis is again seen over the posterior right cerebral convexity consistent with remote subarachnoid hemorrhage with some chronic parenchymal microhemorrhages also again noted in this region. Patchy and confluent T2 hyperintensities in the cerebral white matter bilaterally are unchanged and nonspecific but compatible with severe chronic small vessel ischemic disease. Asymmetrically advanced volume loss is again noted in the mesial right temporal lobe. No mass, midline shift, or extra-axial fluid collection is identified. Vascular: Major intracranial vascular flow voids are preserved. Skull and upper cervical spine: Unremarkable bone marrow signal. Sinuses/Orbits: Unremarkable orbits. Mild mucosal thickening in the frontal and ethmoid sinuses bilaterally. Clear mastoid air cells. Other: None. MRA HEAD FINDINGS Anterior circulation: The internal carotid arteries are patent from skull base to carotid termini with at most mild supraclinoid stenosis bilaterally. ACAs and MCAs are patent without evidence of a proximal branch occlusion or significant proximal stenosis. No aneurysm is identified. Posterior circulation: The intracranial vertebral arteries are widely patent to the basilar and codominant. Patent PICA and SCA origins are seen bilaterally. The basilar artery is widely patent. Posterior communicating arteries are diminutive or absent. Both PCAs are patent without evidence of a significant proximal stenosis. No aneurysm is identified. Anatomic variants: None. IMPRESSION: 1. Recurrent acute ischemia in the right basal ganglia. 2. Severe chronic small vessel ischemic disease. 3. No major intracranial arterial occlusion or high-grade proximal stenosis. Electronically Signed   By: Logan Bores M.D.   On: 11/18/2020 10:12   MR BRAIN WO  CONTRAST  Result Date: 11/18/2020 CLINICAL DATA:  Generalized weakness, unable to walk, confusion, recent CVA received IV TPA. EXAM: MRI HEAD WITHOUT CONTRAST MRA HEAD WITHOUT CONTRAST TECHNIQUE: Multiplanar, multi-echo pulse sequences of the brain and surrounding structures were acquired without intravenous contrast. Angiographic images of the Circle of Willis were acquired using MRA technique without intravenous contrast. COMPARISON:  Head CT 11/17/2020 and MRI 11/08/2020 FINDINGS: MRI HEAD FINDINGS Brain: There is restricted diffusion involving the right basal ganglia (caudate body and lentiform nucleus) and corona radiata which is in the same location as the acute infarct on the prior MRI, however the extent of the diffusion abnormality today is greater than on the prior MRI with more intensely restricted diffusion consistent with recurrent acute ischemia. Superficial siderosis is again seen over the posterior right cerebral convexity consistent with remote subarachnoid hemorrhage with some chronic parenchymal  microhemorrhages also again noted in this region. Patchy and confluent T2 hyperintensities in the cerebral white matter bilaterally are unchanged and nonspecific but compatible with severe chronic small vessel ischemic disease. Asymmetrically advanced volume loss is again noted in the mesial right temporal lobe. No mass, midline shift, or extra-axial fluid collection is identified. Vascular: Major intracranial vascular flow voids are preserved. Skull and upper cervical spine: Unremarkable bone marrow signal. Sinuses/Orbits: Unremarkable orbits. Mild mucosal thickening in the frontal and ethmoid sinuses bilaterally. Clear mastoid air cells. Other: None. MRA HEAD FINDINGS Anterior circulation: The internal carotid arteries are patent from skull base to carotid termini with at most mild supraclinoid stenosis bilaterally. ACAs and MCAs are patent without evidence of a proximal branch occlusion or significant  proximal stenosis. No aneurysm is identified. Posterior circulation: The intracranial vertebral arteries are widely patent to the basilar and codominant. Patent PICA and SCA origins are seen bilaterally. The basilar artery is widely patent. Posterior communicating arteries are diminutive or absent. Both PCAs are patent without evidence of a significant proximal stenosis. No aneurysm is identified. Anatomic variants: None. IMPRESSION: 1. Recurrent acute ischemia in the right basal ganglia. 2. Severe chronic small vessel ischemic disease. 3. No major intracranial arterial occlusion or high-grade proximal stenosis. Electronically Signed   By: Logan Bores M.D.   On: 11/18/2020 10:12   MR BRAIN WO CONTRAST  Result Date: 11/08/2020 CLINICAL DATA:  Stroke follow-up. EXAM: MRI HEAD WITHOUT CONTRAST TECHNIQUE: Multiplanar, multiecho pulse sequences of the brain and surrounding structures were obtained without intravenous contrast. COMPARISON:  Head CT 11/07/2020 and MRI 10/09/2015 FINDINGS: Brain: There is a small acute infarct involving the right basal ganglia and corona radiata. Patchy and confluent T2 hyperintensities in the cerebral white matter bilaterally have progressed from the prior MRI and are nonspecific but compatible with severe chronic small vessel ischemic disease. Susceptibility artifact involving multiple right-sided cerebral sulci in the temporal, occipital, and parietal regions has increased from the prior study and is consistent with previous subarachnoid hemorrhage, also with scattered chronic parenchymal microhemorrhages in this region. There may be a small amount of superficial siderosis over the posterior left cerebral convexity as well versus susceptibility from vessels. There is mild-to-moderate global cerebral atrophy with asymmetric volume loss noted in the mesial temporal lobe on the right. No mass, midline shift, or extra-axial fluid collection is evident. Vascular: Major intracranial  vascular flow voids are preserved. Skull and upper cervical spine: Unremarkable bone marrow signal para Sinuses/Orbits: Unremarkable orbits. Paranasal sinuses and mastoid air cells are clear. Other: None. IMPRESSION: 1. Small acute right basal ganglia/corona radiata infarct. 2. Severe chronic small vessel ischemic disease. 3. Evidence of previous subarachnoid hemorrhage and underlying chronic parenchymal microhemorrhages in the posterior right cerebral hemisphere, query cerebral amyloid angiopathy. Electronically Signed   By: Logan Bores M.D.   On: 11/08/2020 19:15   DG Chest Port 1 View  Result Date: 11/21/2020 CLINICAL DATA:  Cough. EXAM: PORTABLE CHEST 1 VIEW COMPARISON:  November 17, 2020 FINDINGS: The heart size and mediastinal contours are stable. The heart size is mildly enlarged. Mild patchy opacities identified in the medial left lung base. There is no pulmonary edema or pleural effusion. The visualized skeletal structures are stable. IMPRESSION: Mild patchy opacities identified in the medial left lung base, developing pneumonia is not excluded. Electronically Signed   By: Abelardo Diesel M.D.   On: 11/21/2020 11:49   DG CHEST PORT 1 VIEW  Result Date: 11/17/2020 CLINICAL DATA:  Increased weakness EXAM: PORTABLE CHEST  1 VIEW COMPARISON:  11/07/2020 FINDINGS: The heart size and mediastinal contours are within normal limits. Both lungs are clear. The visualized skeletal structures are unremarkable. IMPRESSION: No active disease. Electronically Signed   By: Donavan Foil M.D.   On: 11/17/2020 18:02   EEG adult  Result Date: 11/18/2020 Lora Havens, MD     11/18/2020  1:08 PM Patient Name: SACRAMENTO VESCOVI MRN: [id number] Epilepsy Attending: Lora Havens Referring Physician/Provider: Dr Jenetta Downer Date: 11/18/2020 Duration: 23.06 mins Patient history: 87yo m with reports of confusion, staring spells. EEG to evaluate for seizure Level of alertness: Awake AEDs during EEG study: None  Technical aspects: This EEG study was done with scalp electrodes positioned according to the 10-20 International system of electrode placement. Electrical activity was acquired at a sampling rate of '500Hz'$  and reviewed with a high frequency filter of '70Hz'$  and a low frequency filter of '1Hz'$ . EEG data were recorded continuously and digitally stored. Description: The posterior dominant rhythm consists of 7.5 Hz activity of moderate voltage (25-35 uV) seen predominantly in posterior head regions, symmetric and reactive to eye opening and eye closing. Hyperventilation and photic stimulation were not performed.   IMPRESSION: This study is within normal limits. No seizures or epileptiform discharges were seen throughout the recording. Lora Havens   ECHOCARDIOGRAM COMPLETE  Result Date: 11/09/2020    ECHOCARDIOGRAM REPORT   Patient Name:   KINSEY RUPANI Date of Exam: 11/09/2020 Medical Rec #:  [id number]        Height:       72.0 in Accession #:    [id number]       Weight:       169.0 lb Date of Birth:  08-02-1929       BSA:          1.983 m Patient Age:    53 years         BP:           155/88 mmHg Patient Gender: M                HR:           70 bpm. Exam Location:  Inpatient Procedure: 2D Echo, Cardiac Doppler and Color Doppler Indications:    Stroke I63.9  History:        Patient has no prior history of Echocardiogram examinations.                 Carotid Disease; Risk Factors:Hypertension and Dyslipidemia.                 History of Cancer.  Sonographer:    Darlina Sicilian RDCS Referring Phys: [phone] ERIC LINDZEN  Sonographer Comments: Suboptimal parasternal window and suboptimal subcostal window. Image acquisition challenging due to respiratory motion. IMPRESSIONS  1. Left ventricular ejection fraction, by estimation, is 55 to 60%. The left ventricle has normal function. The left ventricle has no regional wall motion abnormalities. There is mild focal basal septal left ventricular hypertrophy. Left ventricular  diastolic parameters are indeterminate.  2. Right ventricular systolic function is normal. The right ventricular size is normal. Tricuspid regurgitation signal is inadequate for assessing PA pressure.  3. The mitral valve is normal in structure. No evidence of mitral valve regurgitation. No evidence of mitral stenosis.  4. The aortic valve is tricuspid. Aortic valve regurgitation is trivial. Mild aortic valve sclerosis is present, with no evidence of aortic valve stenosis.  5. The inferior vena cava is normal  in size with greater than 50% respiratory variability, suggesting right atrial pressure of 3 mmHg.  6. Technically difficult study with poor acoustic windows. Tech FINDINGS  Left Ventricle: Left ventricular ejection fraction, by estimation, is 55 to 60%. The left ventricle has normal function. The left ventricle has no regional wall motion abnormalities. The left ventricular internal cavity size was normal in size. There is  mild focal basal septal left ventricular hypertrophy. Left ventricular diastolic parameters are indeterminate. Right Ventricle: The right ventricular size is normal. No increase in right ventricular wall thickness. Right ventricular systolic function is normal. Tricuspid regurgitation signal is inadequate for assessing PA pressure. Left Atrium: Left atrial size was normal in size. Right Atrium: Right atrial size was normal in size. Pericardium: There is no evidence of pericardial effusion. Mitral Valve: The mitral valve is normal in structure. No evidence of mitral valve regurgitation. No evidence of mitral valve stenosis. Tricuspid Valve: The tricuspid valve is normal in structure. Tricuspid valve regurgitation is not demonstrated. Aortic Valve: The aortic valve is tricuspid. Aortic valve regurgitation is trivial. Mild aortic valve sclerosis is present, with no evidence of aortic valve stenosis. Pulmonic Valve: The pulmonic valve was not well visualized. Pulmonic valve regurgitation is not  visualized. Aorta: The aortic root was not well visualized. Venous: The inferior vena cava is normal in size with greater than 50% respiratory variability, suggesting right atrial pressure of 3 mmHg. IAS/Shunts: No atrial level shunt detected by color flow Doppler.  LEFT VENTRICLE PLAX 2D LVOT diam:     1.90 cm  Diastology LV SV:         59       LV e' medial:    6.96 cm/s LV SV Index:   30       LV E/e' medial:  7.1 LVOT Area:     2.84 cm LV e' lateral:   6.74 cm/s                         LV E/e' lateral: 7.4  RIGHT VENTRICLE RV S prime:     11.00 cm/s TAPSE (M-mode): 1.3 cm LEFT ATRIUM           Index       RIGHT ATRIUM          Index LA Vol (A4C): 27.6 ml 13.92 ml/m RA Area:     9.96 cm                                   RA Volume:   19.00 ml 9.58 ml/m  AORTIC VALVE LVOT Vmax:   108.00 cm/s LVOT Vmean:  66.400 cm/s LVOT VTI:    0.208 m  AORTA Ao Root diam: 3.00 cm MITRAL VALVE MV Area (PHT): 3.23 cm    SHUNTS MV Decel Time: 235 msec    Systemic VTI:  0.21 m MV E velocity: 49.70 cm/s  Systemic Diam: 1.90 cm MV A velocity: 88.30 cm/s MV E/A ratio:  0.56 Dalton McleanMD Electronically signed by Franki Monte Signature Date/Time: 11/09/2020/2:29:46 PM    Final    CT HEAD CODE STROKE WO CONTRAST  Result Date: 11/07/2020 CLINICAL DATA:  Code stroke. Neuro deficit, acute, stroke suspected. Found down. Right-sided weakness. EXAM: CT HEAD WITHOUT CONTRAST TECHNIQUE: Contiguous axial images were obtained from the base of the skull through the vertex without intravenous contrast. COMPARISON:  Head MRI 10/09/2015 FINDINGS:  Brain: There is no evidence of an acute infarct, intracranial hemorrhage, mass, midline shift, or extra-axial fluid collection. Confluent hypodensities in the cerebral white matter bilaterally are nonspecific but compatible with severe chronic small vessel ischemic disease which has progressed from the prior MRI. There is a chronic lacunar infarct in the left caudate nucleus. There is mild global  cerebral atrophy with asymmetric volume loss noted in the mesial right temporal lobe. Vascular: Calcified atherosclerosis at the skull base. No hyperdense vessel. Skull: No fracture or suspicious osseous lesion. Sinuses/Orbits: Visualized paranasal sinuses and mastoid air cells are clear. Unremarkable orbits. Other: None. ASPECTS [facility] Stroke Program Early CT Score) - Ganglionic level infarction (caudate, lentiform nuclei, internal capsule, insula, M1-M3 cortex): 7 - Supraganglionic infarction (M4-M6 cortex): 3 Total score (0-10 with 10 being normal): 10 IMPRESSION: 1. No evidence of acute intracranial abnormality.  ASPECTS of 10. 2. Severe chronic small vessel ischemic disease. These results were called by telephone at the time of interpretation on 11/07/2020 at 1:43 pm to Dr. Nanda Quinton, who verbally acknowledged these results. Electronically Signed   By: Logan Bores M.D.   On: 11/07/2020 13:43   VAS US CAROTID  Result Date: 11/09/2020 Carotid Arterial Duplex Study Patient Name:  NOTORIOUS DOROW  Date of Exam:   11/09/2020 Medical Rec #: [id number]         Accession #:    [id number] Date of Birth: Jul 23, 1929        Patient Gender: M Patient Age:   70 years Exam Location:  [facility] Procedure:      VAS US CAROTID Referring Phys: Laurey Morale --------------------------------------------------------------------------------  Indications:       CVA. Risk Factors:      Hypertension, hyperlipidemia. Comparison Study:  05/26/2017 carotid artery duplex- bilateral 1-39% ICA                    stenosis. Performing Technologist: Maudry Mayhew MHA, RDMS, RVT, RDCS  Examination Guidelines: A complete evaluation includes B-mode imaging, spectral Doppler, color Doppler, and power Doppler as needed of all accessible portions of each vessel. Bilateral testing is considered an integral part of a complete examination. Limited examinations for reoccurring indications may be performed as noted.  Right Carotid  Findings: +----------+--------+--------+--------+--------------------------+--------+           PSV cm/sEDV cm/sStenosisPlaque Description        Comments +----------+--------+--------+--------+--------------------------+--------+ CCA Prox  74      11                                                 +----------+--------+--------+--------+--------------------------+--------+ CCA Distal63      12                                                 +----------+--------+--------+--------+--------------------------+--------+ ICA Prox  46      9               irregular and heterogenous         +----------+--------+--------+--------+--------------------------+--------+ ICA Distal43      10                                                 +----------+--------+--------+--------+--------------------------+--------+  ECA       70      8                                                  +----------+--------+--------+--------+--------------------------+--------+ +----------+--------+-------+----------------+-------------------+           PSV cm/sEDV cmsDescribe        Arm Pressure (mmHG) +----------+--------+-------+----------------+-------------------+ [id number]            Multiphasic, WNL                    +----------+--------+-------+----------------+-------------------+ +---------+--------+--+--------+-+---------+ VertebralPSV cm/s27EDV cm/s9Antegrade +---------+--------+--+--------+-+---------+  Left Carotid Findings: +----------+--------+--------+--------+--------------------------+--------+           PSV cm/sEDV cm/sStenosisPlaque Description        Comments +----------+--------+--------+--------+--------------------------+--------+ CCA Prox  67      10                                                 +----------+--------+--------+--------+--------------------------+--------+ CCA Distal78      15              heterogenous and irregular          +----------+--------+--------+--------+--------------------------+--------+ ICA Prox  136     24              calcific                           +----------+--------+--------+--------+--------------------------+--------+ ICA Distal86      17                                                 +----------+--------+--------+--------+--------------------------+--------+ ECA       118     16              heterogenous and irregular         +----------+--------+--------+--------+--------------------------+--------+ +----------+--------+--------+----------------+-------------------+           PSV cm/sEDV cm/sDescribe        Arm Pressure (mmHG) +----------+--------+--------+----------------+-------------------+ Subclavian113             Multiphasic, WNL                    +----------+--------+--------+----------------+-------------------+ +---------+--------+--+--------+-+---------+ VertebralPSV cm/s45EDV cm/s7Antegrade +---------+--------+--+--------+-+---------+   Summary: Right Carotid: Velocities in the right ICA are consistent with a 1-39% stenosis. Left Carotid: Velocities in the left ICA are consistent with a 1-39% stenosis,               however velocities may be underestimated due to calcific               shadowing. Vertebrals:  Bilateral vertebral arteries demonstrate antegrade flow. Subclavians: Normal flow hemodynamics were seen in bilateral subclavian              arteries. *See table(s) above for measurements and observations.  Electronically signed by Antony Contras MD on 11/09/2020 at 1:40:40 PM.    Final    VAS [place] TRANSCRANIAL DOPPLER  Result Date: 11/09/2020  Transcranial Doppler Patient Name:  TUAN DELSIGNORE  Date of Exam:   11/09/2020 Medical Rec #: [id number]         Accession #:    [id number] Date of Birth: June 23, 1929        Patient Gender: M Patient Age:   75 years Exam Location:  [facility] Procedure:      VAS [place] TRANSCRANIAL DOPPLER Referring Phys:  PRAMOD SETHI --------------------------------------------------------------------------------  Indications: Stroke. History: HTN, hyperlipidemia. Limitations: Poor acoustic windows Limitations for diagnostic windows: Unable to insonate right transtemporal window. Unable to insonate left transtemporal window. Unable to insonate occipital window. Comparison Study: No prior study Performing Technologist: Maudry Mayhew MHA, RDMS, RVT, RDCS  Examination Guidelines: A complete evaluation includes B-mode imaging, spectral Doppler, color Doppler, and power Doppler as needed of all accessible portions of each vessel. Bilateral testing is considered an integral part of a complete examination. Limited examinations for reoccurring indications may be performed as noted.  +----------+-------------+----------+-----------+------------------+ RIGHT TCD Right VM (cm)Depth (cm)Pulsatility     Comment       +----------+-------------+----------+-----------+------------------+ MCA                                         Unable to insonate +----------+-------------+----------+-----------+------------------+ ACA                                         Unable to insonate +----------+-------------+----------+-----------+------------------+ Term ICA                                    Unable to insonate +----------+-------------+----------+-----------+------------------+ PCA                                         Unable to insonate +----------+-------------+----------+-----------+------------------+ Opthalmic     12.00                 1.07                       +----------+-------------+----------+-----------+------------------+ ICA siphon                                  Unable to insonate +----------+-------------+----------+-----------+------------------+ Vertebral                                   Unable to insonate +----------+-------------+----------+-----------+------------------+   +----------+------------+----------+-----------+------------------+ LEFT TCD  Left VM (cm)Depth (cm)Pulsatility     Comment       +----------+------------+----------+-----------+------------------+ MCA                                        Unable to insonate +----------+------------+----------+-----------+------------------+ ACA                                        Unable to insonate +----------+------------+----------+-----------+------------------+ Term ICA  Unable to insonate +----------+------------+----------+-----------+------------------+ PCA                                        Unable to insonate +----------+------------+----------+-----------+------------------+ Opthalmic    12.00                 1.08                       +----------+------------+----------+-----------+------------------+ ICA siphon                                 Unable to insonate +----------+------------+----------+-----------+------------------+ Vertebral                                  Unable to insonate +----------+------------+----------+-----------+------------------+  +------------+-----+------------------+             VM cm     Comment       +------------+-----+------------------+ Prox Basilar     Unable to insonate +------------+-----+------------------+ Dist Basilar     Unable to insonate +------------+-----+------------------+ Summary:  Huighly suboptimal study with poor windows throughout. antegrade flow noted in both opthalmic arteries *See table(s) above for TCD measurements and observations.  Diagnosing physician: Antony Contras MD Electronically signed by Antony Contras MD on 11/09/2020 at 1:42:29 PM.    Final     Micro Results   Recent Results (from the past 240 hour(s))  SARS CORONAVIRUS 2 (TAT 6-24 HRS) Nasopharyngeal Nasopharyngeal Swab     Status: None   Collection Time: 11/17/20  5:20 PM   Specimen: Nasopharyngeal  Swab  Result Value Ref Range Status   SARS Coronavirus 2 NEGATIVE NEGATIVE Final    Comment: (NOTE) SARS-CoV-2 target nucleic acids are NOT DETECTED.  The SARS-CoV-2 RNA is generally detectable in upper and lower respiratory specimens during the acute phase of infection. Negative results do not preclude SARS-CoV-2 infection, do not rule out co-infections with other pathogens, and should not be used as the sole basis for treatment or other patient management decisions. Negative results must be combined with clinical observations, patient history, and epidemiological information. The expected result is Negative.  Fact Sheet for Patients: SugarRoll.be  Fact Sheet for Healthcare Providers: https://www.woods-mathews.com/  This test is not yet approved or cleared by the [place] FDA and  has been authorized for detection and/or diagnosis of SARS-CoV-2 by FDA under an Emergency Use Authorization (EUA). This EUA will remain  in effect (meaning this test can be used) for the duration of the COVID-19 declaration under Se ction 564(b)(1) of the Act, 21 U.S.C. section 360bbb-3(b)(1), unless the authorization is terminated or revoked sooner.  Performed at [facility], [place] [address] [place], [place] [address]    Today   Subjective    Tyjuan Kostek today has no new complaints         --Plan of care discussed with patient's daughters at bedside  Ms. Tobi Bastos and also patient's daughter Ms. Federico Flake who lives in [place] but is present here today - Family agreeable with discharge back to [facility] -Transition of care/social worker working with SNF with tentative transfer back to [facility] on Sunday, 11/22/2020  Patient has been seen and examined prior to discharge   Objective   Blood pressure (!) 181/74, pulse  61, temperature 98.7 F (37.1 C), temperature source Oral, resp. rate 15, height 6' (1.829 m), weight 99.3 kg,  SpO2 98 %.  Temp:  [98.7 F (37.1 C)-99.2 F (37.3 C)] 98.7 F (37.1 C) (09/10 1020) Pulse Rate:  [56-90] 61 (09/10 1500) Resp:  [15-23] 15 (09/10 1500) BP: (136-182)/(74-95) 181/74 (09/10 1500) SpO2:  [97 %-100 %] 98 % (09/10 1500) Weight:  [99.3 kg] 99.3 kg (09/10 1011)   Intake/Output Summary (Last 24 hours) at 11/21/2020 1525 Last data filed at 11/21/2020 1513 Gross per 24 hour  Intake 100 ml  Output --  Net 100 ml    Exam Gen:- Awake Alert, no acute distress  HEENT:- [place].AT, No sclera icterus Neck-Supple Neck,No JVD,.  Lungs-  CTAB , good air movement bilaterally  CV- S1, S2 normal, regular Abd-  +ve B.Sounds, Abd Soft, No tenderness,    Extremity/Skin:- No  edema,   good pulses Psych- baseline advanced cognitive and memory deficits  neuro-generalized weakness and deconditioning, appears weak on the left compared to the right , no tremors    Data Review   CBC w Diff:  Lab Results  Component Value Date   WBC 7.9 11/21/2020   HGB 13.3 11/21/2020   HCT 39.0 11/21/2020   PLT 205 11/21/2020   LYMPHOPCT 16 11/21/2020   MONOPCT 9 11/21/2020   EOSPCT 2 11/21/2020   BASOPCT 1 11/21/2020    CMP:  Lab Results  Component Value Date   NA 141 11/21/2020   K 3.5 11/21/2020   CL 106 11/21/2020   CO2 24 11/21/2020   BUN 10 11/21/2020   CREATININE 1.30 (H) 11/21/2020   PROT 7.4 11/21/2020   ALBUMIN 3.4 (L) 11/21/2020   BILITOT 0.9 11/21/2020   ALKPHOS 89 11/21/2020   AST 35 11/21/2020   ALT 21 11/21/2020    Total Discharge time is about 33 minutes  Roxan Hockey M.D on 11/21/2020 at 3:25 PM  Go to [email or web address] -  for contact info  [facility] - Office  [phone]

## 2020-11-21 NOTE — ED Notes (Signed)
Cleaned and changed pt. Pt had a small BM. Put pure wick on pt.

## 2020-11-21 NOTE — ED Notes (Signed)
Checked on pt. When I came in nurse was feeding pt. So I finished feeding pt. He ate 95 % of his food and drank very well.

## 2020-11-21 NOTE — ED Notes (Signed)
Checked on pt and asked pt if he needed anything.

## 2020-11-22 ENCOUNTER — Encounter (HOSPITAL_COMMUNITY): Payer: Self-pay

## 2020-11-22 DIAGNOSIS — Z8673 Personal history of transient ischemic attack (TIA), and cerebral infarction without residual deficits: Secondary | ICD-10-CM | POA: Diagnosis not present

## 2020-11-22 DIAGNOSIS — Z09 Encounter for follow-up examination after completed treatment for conditions other than malignant neoplasm: Secondary | ICD-10-CM | POA: Diagnosis not present

## 2020-11-22 DIAGNOSIS — I63511 Cerebral infarction due to unspecified occlusion or stenosis of right middle cerebral artery: Secondary | ICD-10-CM | POA: Diagnosis not present

## 2020-11-22 DIAGNOSIS — Z7401 Bed confinement status: Secondary | ICD-10-CM | POA: Diagnosis not present

## 2020-11-22 DIAGNOSIS — R6889 Other general symptoms and signs: Secondary | ICD-10-CM | POA: Diagnosis not present

## 2020-11-22 DIAGNOSIS — R410 Disorientation, unspecified: Secondary | ICD-10-CM | POA: Diagnosis not present

## 2020-11-22 DIAGNOSIS — G9340 Encephalopathy, unspecified: Secondary | ICD-10-CM | POA: Diagnosis not present

## 2020-11-22 DIAGNOSIS — E039 Hypothyroidism, unspecified: Secondary | ICD-10-CM | POA: Diagnosis not present

## 2020-11-22 DIAGNOSIS — H5462 Unqualified visual loss, left eye, normal vision right eye: Secondary | ICD-10-CM | POA: Diagnosis not present

## 2020-11-22 DIAGNOSIS — R531 Weakness: Secondary | ICD-10-CM | POA: Diagnosis not present

## 2020-11-22 DIAGNOSIS — E785 Hyperlipidemia, unspecified: Secondary | ICD-10-CM | POA: Diagnosis not present

## 2020-11-22 DIAGNOSIS — Z79899 Other long term (current) drug therapy: Secondary | ICD-10-CM | POA: Diagnosis not present

## 2020-11-22 DIAGNOSIS — R791 Abnormal coagulation profile: Secondary | ICD-10-CM | POA: Diagnosis not present

## 2020-11-22 DIAGNOSIS — R262 Difficulty in walking, not elsewhere classified: Secondary | ICD-10-CM | POA: Diagnosis not present

## 2020-11-22 DIAGNOSIS — Z743 Need for continuous supervision: Secondary | ICD-10-CM | POA: Diagnosis not present

## 2020-11-22 DIAGNOSIS — R2689 Other abnormalities of gait and mobility: Secondary | ICD-10-CM | POA: Diagnosis not present

## 2020-11-22 DIAGNOSIS — J690 Pneumonitis due to inhalation of food and vomit: Secondary | ICD-10-CM | POA: Diagnosis not present

## 2020-11-22 DIAGNOSIS — I10 Essential (primary) hypertension: Secondary | ICD-10-CM | POA: Diagnosis not present

## 2020-11-22 DIAGNOSIS — R279 Unspecified lack of coordination: Secondary | ICD-10-CM | POA: Diagnosis not present

## 2020-11-22 DIAGNOSIS — Z7902 Long term (current) use of antithrombotics/antiplatelets: Secondary | ICD-10-CM | POA: Diagnosis not present

## 2020-11-22 DIAGNOSIS — M6281 Muscle weakness (generalized): Secondary | ICD-10-CM | POA: Diagnosis not present

## 2020-11-22 LAB — CBG MONITORING, ED
Glucose-Capillary: 122 mg/dL — ABNORMAL HIGH (ref 70–99)
Glucose-Capillary: 142 mg/dL — ABNORMAL HIGH (ref 70–99)

## 2020-11-22 MED ORDER — DOXAZOSIN MESYLATE 4 MG PO TABS
4.0000 mg | ORAL_TABLET | Freq: Once | ORAL | Status: AC
  Administered 2020-11-22: 4 mg via ORAL
  Filled 2020-11-22: qty 1

## 2020-11-22 NOTE — ED Notes (Signed)
Spoke with ems dispatch at this time they will contact night shift supervisor for transport of pt before midnight or per [employer] authorization expires at midnight. Jaymi Tinner

## 2020-11-22 NOTE — ED Notes (Signed)
PT ate 90% of breakfast tray, fed by RN

## 2020-11-22 NOTE — ED Notes (Signed)
Pt alert, nad, family at bedside.

## 2020-11-22 NOTE — ED Notes (Signed)
Checked on pt he was laying down. And asked him if he needed anything.

## 2020-11-22 NOTE — ED Notes (Signed)
Daughter states she is leaving to go sign paperwork at [facility] for pt to return

## 2020-11-22 NOTE — ED Notes (Signed)
Meal tray delivered, dysphagia 1 diet. RN to assist pt with eating

## 2020-11-22 NOTE — Discharge Summary (Addendum)
Patient seen on 11/22/20 in ED room # 12 and evaluated, chart reviewed, please see EMR for updated orders. Please see full consult Note/Discharge Summary dated 11/21/2020 including discharge med rec and discharge instructions--- dictated by Roxan Hockey, MD  -Ate much better for breakfast, able to answer simple questions- -No fever , no shob No Nausea, Vomiting or Diarrhea -I called and updated patient's daughter Ms. Katharine Look Wilder----[phone], questions answered-  Exam Gen:- Awake Alert, no acute distress  HEENT:- [place].AT, No sclera icterus Neck-Supple Neck,No JVD,.  Lungs-  CTAB , good air movement bilaterally  CV- S1, S2 normal, regular Abd-  +ve B.Sounds, Abd Soft, No tenderness,    Extremity/Skin:- No  edema,   good pulses Psych- baseline advanced cognitive and memory deficits  neuro-generalized weakness and deconditioning, appears weak on the left compared to the right , no tremors    Please see full consult Note/Discharge Summary dated 11/21/2020 including discharge med rec and discharge instructions---  Allergies as of 11/22/2020   No Known Allergies      Medication List     TAKE these medications    acetaminophen 325 MG tablet Commonly known as: TYLENOL Take 2 tablets (650 mg total) by mouth every 6 (six) hours as needed for mild pain or fever (or Fever >/= 101). What changed: Another medication with the same name was added. Make sure you understand how and when to take each.   acetaminophen 160 MG/5ML elixir Commonly known as: TYLENOL Take 20 mLs (640 mg total) by mouth every 4 (four) hours as needed for fever or pain. What changed: You were already taking a medication with the same name, and this prescription was added. Make sure you understand how and when to take each.   amLODipine 10 MG tablet Commonly known as: NORVASC Take 1 tablet (10 mg total) by mouth daily.   atorvastatin 40 MG tablet Commonly known as: LIPITOR Take 1 tablet (40 mg total) by mouth  daily.   azithromycin 200 MG/5ML suspension Commonly known as: ZITHROMAX Take 12.5 mLs (500 mg total) by mouth daily for 5 days.   cefdinir 250 MG/5ML suspension Commonly known as: OMNICEF Take 6 mLs (300 mg total) by mouth 2 (two) times daily for 5 days.   clopidogrel 75 MG tablet Commonly known as: PLAVIX Take 1 tablet (75 mg total) by mouth daily.   donepezil 10 MG tablet Commonly known as: ARICEPT TAKE 1 TABLET(10 MG) BY MOUTH AT BEDTIME What changed: See the new instructions.   doxazosin 4 MG tablet Commonly known as: CARDURA Take 1 tablet (4 mg total) by mouth daily.   guaiFENesin 100 MG/5ML Soln Commonly known as: ROBITUSSIN Take 10 mLs (200 mg total) by mouth every 4 (four) hours as needed for cough or to loosen phlegm.   levothyroxine 75 MCG tablet Commonly known as: SYNTHROID Take 1 tablet (75 mcg total) by mouth daily.   memantine 10 MG tablet Commonly known as: NAMENDA Take 1 tablet (10 mg total) by mouth 2 (two) times daily.   multivitamin tablet Take 1 tablet by mouth daily.        Discharge Instructions     Call MD for:  difficulty breathing, headache or visual disturbances   Complete by: As directed    Call MD for:  persistant dizziness or light-headedness   Complete by: As directed    Call MD for:  persistant nausea and vomiting   Complete by: As directed    Call MD for:  temperature >100.4  Complete by: As directed    Diet - low sodium heart healthy   Complete by: As directed    Pureed Solids   Discharge instructions   Complete by: As directed    1)Recommendations     Diet recommendations: Dysphagia 1 (pured);Thin liquid Liquids provided via: Cup;Straw Medication Administration: Crushed with puree Supervision: Patient able to self feed;Intermittent supervision to cue for compensatory strategies Compensations: Minimize environmental distractions;Slow rate;Small sips/bites Postural Changes and/or Swallow Maneuvers: Seated upright 90  degrees;Upright 30-60 min after meal   2)Please follow-up with Neurologist Dr. Phillips Odor-- Phone: [phone], Address: [address], [place], [place] [address] in 4 to 6 weeks for recheck and reevaluation.  Please call to make appointment with him  3) if patient fails to improve with rehab/therapy--- please consider referral to Hospice to help keep patient comfortable and to preserve patient's dignity (patient is almost 87 years old with advanced dementia and recurrent strokes--- overall prognosis is poor)   Increase activity slowly   Complete by: As directed       Patient seen on 11/22/20 in ED room # 12 and evaluated, chart reviewed, please see EMR for updated orders. Please see full consult Note/Discharge Summary dated 11/21/2020 including discharge med rec and discharge instructions--- dictated by Roxan Hockey, MD  Please see full consult Note/Discharge Summary dated 11/21/2020 including discharge med rec and discharge instructions---  -- Total care time is 41 minutes  Roxan Hockey, MD

## 2020-11-22 NOTE — ED Notes (Signed)
Pt attempted to get out of bed x2. Pt placed on posey pad. Call bell in reach. Family at bedside. Stretcher lowered and locked.

## 2020-11-22 NOTE — TOC Transition Note (Signed)
Transition of Care [facility]) - CM/SW Discharge Note   Patient Details  Name: Eduardo Vasquez MRN: [id number] Date of Birth: [birth year]  Transition of Care [facility]) CM/SW Contact:  Natasha Bence, LCSW Phone Number: 11/22/2020, 11:06 AM   Clinical Narrative:    CSW notified of patient's readiness for discharge. Debbie agreeable to take patient. Nurse to call report. CSW completed med necessity and called EMS. TOC signing off.   Final next level of care: Skilled Nursing Facility Barriers to Discharge: Barriers Resolved   Patient Goals and CMS Choice Patient states their goals for this hospitalization and ongoing recovery are:: rehab with SNF CMS Medicare.gov Compare Post Acute Care list provided to:: Patient Choice offered to / list presented to : Patient  Discharge Placement                Patient to be transferred to facility by: [facility] Name of family member notified: Jameis, Koltes (Daughter)   [phone] Patient and family notified of of transfer: 11/22/20  Discharge Plan and Services                                     Social Determinants of Health (SDOH) Interventions     Readmission Risk Interventions Readmission Risk Prevention Plan 11/20/2020  Transportation Screening Complete  Home Care Screening Complete  Medication Review (RN CM) Complete  Some recent data might be hidden

## 2020-11-22 NOTE — ED Notes (Signed)
Checked on pt. Feed pt, he ate 100% and drank all of his tea and also drank water as well.

## 2020-11-23 DIAGNOSIS — I10 Essential (primary) hypertension: Secondary | ICD-10-CM | POA: Diagnosis not present

## 2020-11-23 DIAGNOSIS — E039 Hypothyroidism, unspecified: Secondary | ICD-10-CM | POA: Diagnosis not present

## 2020-11-23 DIAGNOSIS — R531 Weakness: Secondary | ICD-10-CM | POA: Diagnosis not present

## 2020-11-23 DIAGNOSIS — Z8673 Personal history of transient ischemic attack (TIA), and cerebral infarction without residual deficits: Secondary | ICD-10-CM | POA: Diagnosis not present

## 2020-11-23 DIAGNOSIS — Z79899 Other long term (current) drug therapy: Secondary | ICD-10-CM | POA: Diagnosis not present

## 2020-11-26 DIAGNOSIS — M6281 Muscle weakness (generalized): Secondary | ICD-10-CM | POA: Diagnosis not present

## 2020-11-26 DIAGNOSIS — Z8673 Personal history of transient ischemic attack (TIA), and cerebral infarction without residual deficits: Secondary | ICD-10-CM | POA: Diagnosis not present

## 2020-11-26 DIAGNOSIS — E039 Hypothyroidism, unspecified: Secondary | ICD-10-CM | POA: Diagnosis not present

## 2020-11-26 DIAGNOSIS — I10 Essential (primary) hypertension: Secondary | ICD-10-CM | POA: Diagnosis not present

## 2020-11-26 DIAGNOSIS — R262 Difficulty in walking, not elsewhere classified: Secondary | ICD-10-CM | POA: Diagnosis not present

## 2020-11-26 DIAGNOSIS — R531 Weakness: Secondary | ICD-10-CM | POA: Diagnosis not present

## 2020-11-26 DIAGNOSIS — R2689 Other abnormalities of gait and mobility: Secondary | ICD-10-CM | POA: Diagnosis not present

## 2020-11-26 DIAGNOSIS — Z79899 Other long term (current) drug therapy: Secondary | ICD-10-CM | POA: Diagnosis not present

## 2020-11-30 DIAGNOSIS — R531 Weakness: Secondary | ICD-10-CM | POA: Diagnosis not present

## 2020-11-30 DIAGNOSIS — G8192 Hemiplegia, unspecified affecting left dominant side: Secondary | ICD-10-CM | POA: Diagnosis not present

## 2020-12-01 DIAGNOSIS — I69354 Hemiplegia and hemiparesis following cerebral infarction affecting left non-dominant side: Secondary | ICD-10-CM | POA: Diagnosis not present

## 2020-12-01 DIAGNOSIS — I639 Cerebral infarction, unspecified: Secondary | ICD-10-CM | POA: Diagnosis not present

## 2020-12-01 DIAGNOSIS — H5462 Unqualified visual loss, left eye, normal vision right eye: Secondary | ICD-10-CM | POA: Diagnosis not present

## 2020-12-01 DIAGNOSIS — I69398 Other sequelae of cerebral infarction: Secondary | ICD-10-CM | POA: Diagnosis not present

## 2020-12-01 DIAGNOSIS — I10 Essential (primary) hypertension: Secondary | ICD-10-CM | POA: Diagnosis not present

## 2020-12-01 DIAGNOSIS — G309 Alzheimer's disease, unspecified: Secondary | ICD-10-CM | POA: Diagnosis not present

## 2020-12-01 DIAGNOSIS — R7303 Prediabetes: Secondary | ICD-10-CM | POA: Diagnosis not present

## 2020-12-01 DIAGNOSIS — R2689 Other abnormalities of gait and mobility: Secondary | ICD-10-CM | POA: Diagnosis not present

## 2020-12-02 ENCOUNTER — Ambulatory Visit (HOSPITAL_COMMUNITY): Payer: Medicare Other | Admitting: Occupational Therapy

## 2020-12-02 ENCOUNTER — Ambulatory Visit (HOSPITAL_COMMUNITY): Payer: Medicare Other | Admitting: Physical Therapy

## 2020-12-03 DIAGNOSIS — I69354 Hemiplegia and hemiparesis following cerebral infarction affecting left non-dominant side: Secondary | ICD-10-CM | POA: Diagnosis not present

## 2020-12-03 DIAGNOSIS — H5462 Unqualified visual loss, left eye, normal vision right eye: Secondary | ICD-10-CM | POA: Diagnosis not present

## 2020-12-03 DIAGNOSIS — I10 Essential (primary) hypertension: Secondary | ICD-10-CM | POA: Diagnosis not present

## 2020-12-03 DIAGNOSIS — I69398 Other sequelae of cerebral infarction: Secondary | ICD-10-CM | POA: Diagnosis not present

## 2020-12-04 DIAGNOSIS — I10 Essential (primary) hypertension: Secondary | ICD-10-CM | POA: Diagnosis not present

## 2020-12-04 DIAGNOSIS — I69354 Hemiplegia and hemiparesis following cerebral infarction affecting left non-dominant side: Secondary | ICD-10-CM | POA: Diagnosis not present

## 2020-12-04 DIAGNOSIS — H5462 Unqualified visual loss, left eye, normal vision right eye: Secondary | ICD-10-CM | POA: Diagnosis not present

## 2020-12-04 DIAGNOSIS — I69398 Other sequelae of cerebral infarction: Secondary | ICD-10-CM | POA: Diagnosis not present

## 2020-12-07 ENCOUNTER — Other Ambulatory Visit (HOSPITAL_COMMUNITY): Payer: Self-pay

## 2020-12-07 DIAGNOSIS — I69354 Hemiplegia and hemiparesis following cerebral infarction affecting left non-dominant side: Secondary | ICD-10-CM | POA: Diagnosis not present

## 2020-12-07 DIAGNOSIS — H5462 Unqualified visual loss, left eye, normal vision right eye: Secondary | ICD-10-CM | POA: Diagnosis not present

## 2020-12-07 DIAGNOSIS — I69398 Other sequelae of cerebral infarction: Secondary | ICD-10-CM | POA: Diagnosis not present

## 2020-12-07 DIAGNOSIS — I10 Essential (primary) hypertension: Secondary | ICD-10-CM | POA: Diagnosis not present

## 2020-12-07 DIAGNOSIS — R1310 Dysphagia, unspecified: Secondary | ICD-10-CM

## 2020-12-08 DIAGNOSIS — H5462 Unqualified visual loss, left eye, normal vision right eye: Secondary | ICD-10-CM | POA: Diagnosis not present

## 2020-12-08 DIAGNOSIS — I69354 Hemiplegia and hemiparesis following cerebral infarction affecting left non-dominant side: Secondary | ICD-10-CM | POA: Diagnosis not present

## 2020-12-08 DIAGNOSIS — I69398 Other sequelae of cerebral infarction: Secondary | ICD-10-CM | POA: Diagnosis not present

## 2020-12-08 DIAGNOSIS — I10 Essential (primary) hypertension: Secondary | ICD-10-CM | POA: Diagnosis not present

## 2020-12-09 ENCOUNTER — Ambulatory Visit (HOSPITAL_COMMUNITY)
Admission: RE | Admit: 2020-12-09 | Discharge: 2020-12-09 | Disposition: A | Discharge status: Home / Self Care | Payer: Medicare Other | Source: Ambulatory Visit | Attending: Internal Medicine | Admitting: Internal Medicine

## 2020-12-09 ENCOUNTER — Other Ambulatory Visit: Payer: Self-pay

## 2020-12-09 DIAGNOSIS — H5462 Unqualified visual loss, left eye, normal vision right eye: Secondary | ICD-10-CM | POA: Diagnosis not present

## 2020-12-09 DIAGNOSIS — R1310 Dysphagia, unspecified: Secondary | ICD-10-CM | POA: Insufficient documentation

## 2020-12-09 DIAGNOSIS — I69354 Hemiplegia and hemiparesis following cerebral infarction affecting left non-dominant side: Secondary | ICD-10-CM | POA: Diagnosis not present

## 2020-12-09 DIAGNOSIS — I69398 Other sequelae of cerebral infarction: Secondary | ICD-10-CM | POA: Diagnosis not present

## 2020-12-09 DIAGNOSIS — I10 Essential (primary) hypertension: Secondary | ICD-10-CM | POA: Diagnosis not present

## 2020-12-09 NOTE — Progress Notes (Signed)
Modified Barium Swallow Progress Note  Patient Details  Name: Eduardo Vasquez MRN: [id number] Date of Birth: Apr 26, 1929  Today's Date: 12/09/2020  Modified Barium Swallow completed.  Full report located under Chart Review in the Imaging Section.  Brief recommendations include the following:  Clinical Impression  Pt demonstrates mild to moderate oral dysphagia with lingual pumping, slow oral transit, poor bolus cohesion and lingual residue post swallow. Liquids spill to the pyrifoms with significant delay. There is an instance of spillage of thin liquids into the glottis prior to the swallow without sensation. Pt tolerates further straw sips of thin without aspiration as well as nectar. There is no pharyngeal residue but mild oral residue is pushed to pharynx with eventual second swallow to clear. Pt is slow to masticate, but capable. Reported to family at bedside that pt would benefit from ongoing nectar and puree for meals given waxing and waning mentation and need for nutrition in setting of slow laborious intake. However, did advise family that pt should be allowed sips of thin with assistance if he desires and also soft moist foods for pleasure with supervision as desired. Would benefit from [place] f/u.   Swallow Evaluation Recommendations       SLP Diet Recommendations: Dysphagia 1 (Puree) solids;Nectar thick liquid;Thin liquid;Dysphagia 2 (Fine chop) solids   Liquid Administration via: Cup;Straw   Medication Administration: Crushed with puree   Supervision:  (family to assist with feeding)   Compensations: Minimize environmental distractions;Slow rate;Small sips/bites   Postural Changes: Seated upright at 90 degrees;Remain semi-upright after after feeds/meals (Comment)            Eduardo Vasquez, Katherene Ponto 12/09/2020,12:03 PM

## 2020-12-11 DIAGNOSIS — I69354 Hemiplegia and hemiparesis following cerebral infarction affecting left non-dominant side: Secondary | ICD-10-CM | POA: Diagnosis not present

## 2020-12-11 DIAGNOSIS — I10 Essential (primary) hypertension: Secondary | ICD-10-CM | POA: Diagnosis not present

## 2020-12-11 DIAGNOSIS — I69398 Other sequelae of cerebral infarction: Secondary | ICD-10-CM | POA: Diagnosis not present

## 2020-12-11 DIAGNOSIS — H5462 Unqualified visual loss, left eye, normal vision right eye: Secondary | ICD-10-CM | POA: Diagnosis not present

## 2020-12-14 DIAGNOSIS — I10 Essential (primary) hypertension: Secondary | ICD-10-CM | POA: Diagnosis not present

## 2020-12-14 DIAGNOSIS — I69398 Other sequelae of cerebral infarction: Secondary | ICD-10-CM | POA: Diagnosis not present

## 2020-12-14 DIAGNOSIS — H5462 Unqualified visual loss, left eye, normal vision right eye: Secondary | ICD-10-CM | POA: Diagnosis not present

## 2020-12-14 DIAGNOSIS — I69354 Hemiplegia and hemiparesis following cerebral infarction affecting left non-dominant side: Secondary | ICD-10-CM | POA: Diagnosis not present

## 2020-12-16 DIAGNOSIS — I69354 Hemiplegia and hemiparesis following cerebral infarction affecting left non-dominant side: Secondary | ICD-10-CM | POA: Diagnosis not present

## 2020-12-16 DIAGNOSIS — I10 Essential (primary) hypertension: Secondary | ICD-10-CM | POA: Diagnosis not present

## 2020-12-16 DIAGNOSIS — Z23 Encounter for immunization: Secondary | ICD-10-CM | POA: Diagnosis not present

## 2020-12-16 DIAGNOSIS — E039 Hypothyroidism, unspecified: Secondary | ICD-10-CM | POA: Diagnosis not present

## 2020-12-16 DIAGNOSIS — I69398 Other sequelae of cerebral infarction: Secondary | ICD-10-CM | POA: Diagnosis not present

## 2020-12-16 DIAGNOSIS — R269 Unspecified abnormalities of gait and mobility: Secondary | ICD-10-CM | POA: Diagnosis not present

## 2020-12-16 DIAGNOSIS — H5462 Unqualified visual loss, left eye, normal vision right eye: Secondary | ICD-10-CM | POA: Diagnosis not present

## 2020-12-17 ENCOUNTER — Other Ambulatory Visit: Payer: Self-pay | Admitting: Neurology

## 2020-12-17 ENCOUNTER — Telehealth: Payer: Self-pay

## 2020-12-17 ENCOUNTER — Other Ambulatory Visit: Payer: Self-pay

## 2020-12-17 ENCOUNTER — Ambulatory Visit: Payer: Medicare Other

## 2020-12-17 DIAGNOSIS — H5462 Unqualified visual loss, left eye, normal vision right eye: Secondary | ICD-10-CM | POA: Diagnosis not present

## 2020-12-17 DIAGNOSIS — I639 Cerebral infarction, unspecified: Secondary | ICD-10-CM

## 2020-12-17 DIAGNOSIS — I4891 Unspecified atrial fibrillation: Secondary | ICD-10-CM

## 2020-12-17 DIAGNOSIS — I10 Essential (primary) hypertension: Secondary | ICD-10-CM | POA: Diagnosis not present

## 2020-12-17 DIAGNOSIS — I69398 Other sequelae of cerebral infarction: Secondary | ICD-10-CM | POA: Diagnosis not present

## 2020-12-17 DIAGNOSIS — I69354 Hemiplegia and hemiparesis following cerebral infarction affecting left non-dominant side: Secondary | ICD-10-CM | POA: Diagnosis not present

## 2020-12-17 NOTE — Telephone Encounter (Signed)
Received 30 day holter monitor order from Dr. Perry Mount office. Order placed and enrolled into [employer]. Monitor to be shipped to pt's home address.

## 2020-12-21 DIAGNOSIS — H5462 Unqualified visual loss, left eye, normal vision right eye: Secondary | ICD-10-CM | POA: Diagnosis not present

## 2020-12-21 DIAGNOSIS — I69354 Hemiplegia and hemiparesis following cerebral infarction affecting left non-dominant side: Secondary | ICD-10-CM | POA: Diagnosis not present

## 2020-12-21 DIAGNOSIS — I10 Essential (primary) hypertension: Secondary | ICD-10-CM | POA: Diagnosis not present

## 2020-12-21 DIAGNOSIS — I69398 Other sequelae of cerebral infarction: Secondary | ICD-10-CM | POA: Diagnosis not present

## 2020-12-22 DIAGNOSIS — I69354 Hemiplegia and hemiparesis following cerebral infarction affecting left non-dominant side: Secondary | ICD-10-CM | POA: Diagnosis not present

## 2020-12-22 DIAGNOSIS — I10 Essential (primary) hypertension: Secondary | ICD-10-CM | POA: Diagnosis not present

## 2020-12-22 DIAGNOSIS — I69398 Other sequelae of cerebral infarction: Secondary | ICD-10-CM | POA: Diagnosis not present

## 2020-12-22 DIAGNOSIS — H5462 Unqualified visual loss, left eye, normal vision right eye: Secondary | ICD-10-CM | POA: Diagnosis not present

## 2020-12-23 DIAGNOSIS — I69354 Hemiplegia and hemiparesis following cerebral infarction affecting left non-dominant side: Secondary | ICD-10-CM | POA: Diagnosis not present

## 2020-12-23 DIAGNOSIS — H5462 Unqualified visual loss, left eye, normal vision right eye: Secondary | ICD-10-CM | POA: Diagnosis not present

## 2020-12-23 DIAGNOSIS — I69398 Other sequelae of cerebral infarction: Secondary | ICD-10-CM | POA: Diagnosis not present

## 2020-12-23 DIAGNOSIS — I10 Essential (primary) hypertension: Secondary | ICD-10-CM | POA: Diagnosis not present

## 2020-12-24 DIAGNOSIS — I10 Essential (primary) hypertension: Secondary | ICD-10-CM | POA: Diagnosis not present

## 2020-12-24 DIAGNOSIS — H5462 Unqualified visual loss, left eye, normal vision right eye: Secondary | ICD-10-CM | POA: Diagnosis not present

## 2020-12-24 DIAGNOSIS — I69354 Hemiplegia and hemiparesis following cerebral infarction affecting left non-dominant side: Secondary | ICD-10-CM | POA: Diagnosis not present

## 2020-12-24 DIAGNOSIS — I69398 Other sequelae of cerebral infarction: Secondary | ICD-10-CM | POA: Diagnosis not present

## 2020-12-25 DIAGNOSIS — H5462 Unqualified visual loss, left eye, normal vision right eye: Secondary | ICD-10-CM | POA: Diagnosis not present

## 2020-12-25 DIAGNOSIS — I69398 Other sequelae of cerebral infarction: Secondary | ICD-10-CM | POA: Diagnosis not present

## 2020-12-25 DIAGNOSIS — I10 Essential (primary) hypertension: Secondary | ICD-10-CM | POA: Diagnosis not present

## 2020-12-25 DIAGNOSIS — I69354 Hemiplegia and hemiparesis following cerebral infarction affecting left non-dominant side: Secondary | ICD-10-CM | POA: Diagnosis not present

## 2020-12-26 DIAGNOSIS — R2689 Other abnormalities of gait and mobility: Secondary | ICD-10-CM | POA: Diagnosis not present

## 2020-12-26 DIAGNOSIS — R262 Difficulty in walking, not elsewhere classified: Secondary | ICD-10-CM | POA: Diagnosis not present

## 2020-12-26 DIAGNOSIS — M6281 Muscle weakness (generalized): Secondary | ICD-10-CM | POA: Diagnosis not present

## 2020-12-28 DIAGNOSIS — I10 Essential (primary) hypertension: Secondary | ICD-10-CM | POA: Diagnosis not present

## 2020-12-28 DIAGNOSIS — I69398 Other sequelae of cerebral infarction: Secondary | ICD-10-CM | POA: Diagnosis not present

## 2020-12-28 DIAGNOSIS — H5462 Unqualified visual loss, left eye, normal vision right eye: Secondary | ICD-10-CM | POA: Diagnosis not present

## 2020-12-28 DIAGNOSIS — I69354 Hemiplegia and hemiparesis following cerebral infarction affecting left non-dominant side: Secondary | ICD-10-CM | POA: Diagnosis not present

## 2020-12-28 NOTE — Telephone Encounter (Signed)
Spoke with nephew Aaron Edelman who states that the family will discuss as whole if they would like to place monitor or not.

## 2020-12-29 DIAGNOSIS — H5462 Unqualified visual loss, left eye, normal vision right eye: Secondary | ICD-10-CM | POA: Diagnosis not present

## 2020-12-29 DIAGNOSIS — I69398 Other sequelae of cerebral infarction: Secondary | ICD-10-CM | POA: Diagnosis not present

## 2020-12-29 DIAGNOSIS — I69354 Hemiplegia and hemiparesis following cerebral infarction affecting left non-dominant side: Secondary | ICD-10-CM | POA: Diagnosis not present

## 2020-12-29 DIAGNOSIS — I10 Essential (primary) hypertension: Secondary | ICD-10-CM | POA: Diagnosis not present

## 2020-12-30 DIAGNOSIS — H5462 Unqualified visual loss, left eye, normal vision right eye: Secondary | ICD-10-CM | POA: Diagnosis not present

## 2020-12-30 DIAGNOSIS — I69354 Hemiplegia and hemiparesis following cerebral infarction affecting left non-dominant side: Secondary | ICD-10-CM | POA: Diagnosis not present

## 2020-12-30 DIAGNOSIS — I69398 Other sequelae of cerebral infarction: Secondary | ICD-10-CM | POA: Diagnosis not present

## 2020-12-30 DIAGNOSIS — I10 Essential (primary) hypertension: Secondary | ICD-10-CM | POA: Diagnosis not present

## 2020-12-31 DIAGNOSIS — I10 Essential (primary) hypertension: Secondary | ICD-10-CM | POA: Diagnosis not present

## 2020-12-31 DIAGNOSIS — I69354 Hemiplegia and hemiparesis following cerebral infarction affecting left non-dominant side: Secondary | ICD-10-CM | POA: Diagnosis not present

## 2020-12-31 DIAGNOSIS — H5462 Unqualified visual loss, left eye, normal vision right eye: Secondary | ICD-10-CM | POA: Diagnosis not present

## 2020-12-31 DIAGNOSIS — I69398 Other sequelae of cerebral infarction: Secondary | ICD-10-CM | POA: Diagnosis not present

## 2021-01-04 DIAGNOSIS — I69398 Other sequelae of cerebral infarction: Secondary | ICD-10-CM | POA: Diagnosis not present

## 2021-01-04 DIAGNOSIS — I69354 Hemiplegia and hemiparesis following cerebral infarction affecting left non-dominant side: Secondary | ICD-10-CM | POA: Diagnosis not present

## 2021-01-04 DIAGNOSIS — I10 Essential (primary) hypertension: Secondary | ICD-10-CM | POA: Diagnosis not present

## 2021-01-04 DIAGNOSIS — H5462 Unqualified visual loss, left eye, normal vision right eye: Secondary | ICD-10-CM | POA: Diagnosis not present

## 2021-01-05 DIAGNOSIS — H5462 Unqualified visual loss, left eye, normal vision right eye: Secondary | ICD-10-CM | POA: Diagnosis not present

## 2021-01-05 DIAGNOSIS — I69354 Hemiplegia and hemiparesis following cerebral infarction affecting left non-dominant side: Secondary | ICD-10-CM | POA: Diagnosis not present

## 2021-01-05 DIAGNOSIS — I69398 Other sequelae of cerebral infarction: Secondary | ICD-10-CM | POA: Diagnosis not present

## 2021-01-05 DIAGNOSIS — I10 Essential (primary) hypertension: Secondary | ICD-10-CM | POA: Diagnosis not present

## 2021-01-07 DIAGNOSIS — H5462 Unqualified visual loss, left eye, normal vision right eye: Secondary | ICD-10-CM | POA: Diagnosis not present

## 2021-01-07 DIAGNOSIS — I69354 Hemiplegia and hemiparesis following cerebral infarction affecting left non-dominant side: Secondary | ICD-10-CM | POA: Diagnosis not present

## 2021-01-07 DIAGNOSIS — I69398 Other sequelae of cerebral infarction: Secondary | ICD-10-CM | POA: Diagnosis not present

## 2021-01-07 DIAGNOSIS — I10 Essential (primary) hypertension: Secondary | ICD-10-CM | POA: Diagnosis not present

## 2021-01-08 DIAGNOSIS — I10 Essential (primary) hypertension: Secondary | ICD-10-CM | POA: Diagnosis not present

## 2021-01-08 DIAGNOSIS — H5462 Unqualified visual loss, left eye, normal vision right eye: Secondary | ICD-10-CM | POA: Diagnosis not present

## 2021-01-08 DIAGNOSIS — I69354 Hemiplegia and hemiparesis following cerebral infarction affecting left non-dominant side: Secondary | ICD-10-CM | POA: Diagnosis not present

## 2021-01-08 DIAGNOSIS — I69398 Other sequelae of cerebral infarction: Secondary | ICD-10-CM | POA: Diagnosis not present

## 2021-01-11 ENCOUNTER — Other Ambulatory Visit (HOSPITAL_COMMUNITY): Payer: Self-pay | Admitting: Gerontology

## 2021-01-11 ENCOUNTER — Other Ambulatory Visit: Payer: Self-pay

## 2021-01-11 ENCOUNTER — Ambulatory Visit (HOSPITAL_COMMUNITY)
Admission: RE | Admit: 2021-01-11 | Discharge: 2021-01-11 | Disposition: A | Discharge status: Home / Self Care | Payer: Medicare Other | Source: Ambulatory Visit | Attending: Gerontology | Admitting: Gerontology

## 2021-01-11 ENCOUNTER — Other Ambulatory Visit (HOSPITAL_COMMUNITY)
Admission: RE | Admit: 2021-01-11 | Discharge: 2021-01-11 | Disposition: A | Discharge status: Home / Self Care | Payer: Medicare Other | Source: Ambulatory Visit | Attending: Internal Medicine | Admitting: Internal Medicine

## 2021-01-11 DIAGNOSIS — R7302 Impaired glucose tolerance (oral): Secondary | ICD-10-CM | POA: Insufficient documentation

## 2021-01-11 DIAGNOSIS — M25462 Effusion, left knee: Secondary | ICD-10-CM | POA: Insufficient documentation

## 2021-01-11 DIAGNOSIS — E785 Hyperlipidemia, unspecified: Secondary | ICD-10-CM | POA: Insufficient documentation

## 2021-01-11 DIAGNOSIS — M25562 Pain in left knee: Secondary | ICD-10-CM | POA: Insufficient documentation

## 2021-01-11 DIAGNOSIS — I10 Essential (primary) hypertension: Secondary | ICD-10-CM | POA: Insufficient documentation

## 2021-01-11 DIAGNOSIS — N1832 Chronic kidney disease, stage 3b: Secondary | ICD-10-CM | POA: Insufficient documentation

## 2021-01-11 DIAGNOSIS — M7989 Other specified soft tissue disorders: Secondary | ICD-10-CM | POA: Diagnosis not present

## 2021-01-11 LAB — CBC WITH DIFFERENTIAL/PLATELET
Abs Immature Granulocytes: 0.03 10*3/uL (ref 0.00–0.07)
Basophils Absolute: 0.1 10*3/uL (ref 0.0–0.1)
Basophils Relative: 1 %
Eosinophils Absolute: 0.1 10*3/uL (ref 0.0–0.5)
Eosinophils Relative: 1 %
HCT: 41.6 % (ref 39.0–52.0)
Hemoglobin: 13.2 g/dL (ref 13.0–17.0)
Immature Granulocytes: 0 %
Lymphocytes Relative: 15 %
Lymphs Abs: 1.3 10*3/uL (ref 0.7–4.0)
MCH: 26.7 pg (ref 26.0–34.0)
MCHC: 31.7 g/dL (ref 30.0–36.0)
MCV: 84 fL (ref 80.0–100.0)
Monocytes Absolute: 0.7 10*3/uL (ref 0.1–1.0)
Monocytes Relative: 8 %
Neutro Abs: 6.4 10*3/uL (ref 1.7–7.7)
Neutrophils Relative %: 75 %
Platelets: 212 10*3/uL (ref 150–400)
RBC: 4.95 MIL/uL (ref 4.22–5.81)
RDW: 16 % — ABNORMAL HIGH (ref 11.5–15.5)
WBC: 8.5 10*3/uL (ref 4.0–10.5)
nRBC: 0 % (ref 0.0–0.2)

## 2021-01-11 LAB — URIC ACID: Uric Acid, Serum: 4.1 mg/dL (ref 3.7–8.6)

## 2021-01-12 DIAGNOSIS — L89312 Pressure ulcer of right buttock, stage 2: Secondary | ICD-10-CM | POA: Diagnosis not present

## 2021-01-12 DIAGNOSIS — I69328 Other speech and language deficits following cerebral infarction: Secondary | ICD-10-CM | POA: Diagnosis not present

## 2021-01-12 DIAGNOSIS — R7303 Prediabetes: Secondary | ICD-10-CM | POA: Diagnosis not present

## 2021-01-12 DIAGNOSIS — I69398 Other sequelae of cerebral infarction: Secondary | ICD-10-CM | POA: Diagnosis not present

## 2021-01-12 DIAGNOSIS — I69354 Hemiplegia and hemiparesis following cerebral infarction affecting left non-dominant side: Secondary | ICD-10-CM | POA: Diagnosis not present

## 2021-01-12 DIAGNOSIS — I10 Essential (primary) hypertension: Secondary | ICD-10-CM | POA: Diagnosis not present

## 2021-01-12 DIAGNOSIS — E7800 Pure hypercholesterolemia, unspecified: Secondary | ICD-10-CM | POA: Diagnosis not present

## 2021-01-12 DIAGNOSIS — Z9181 History of falling: Secondary | ICD-10-CM | POA: Diagnosis not present

## 2021-01-12 DIAGNOSIS — I4510 Unspecified right bundle-branch block: Secondary | ICD-10-CM | POA: Diagnosis not present

## 2021-01-12 DIAGNOSIS — H5462 Unqualified visual loss, left eye, normal vision right eye: Secondary | ICD-10-CM | POA: Diagnosis not present

## 2021-01-12 DIAGNOSIS — Z7902 Long term (current) use of antithrombotics/antiplatelets: Secondary | ICD-10-CM | POA: Diagnosis not present

## 2021-01-13 DIAGNOSIS — R7303 Prediabetes: Secondary | ICD-10-CM | POA: Diagnosis not present

## 2021-01-13 DIAGNOSIS — Z7902 Long term (current) use of antithrombotics/antiplatelets: Secondary | ICD-10-CM | POA: Diagnosis not present

## 2021-01-13 DIAGNOSIS — I69354 Hemiplegia and hemiparesis following cerebral infarction affecting left non-dominant side: Secondary | ICD-10-CM | POA: Diagnosis not present

## 2021-01-13 DIAGNOSIS — Z9181 History of falling: Secondary | ICD-10-CM | POA: Diagnosis not present

## 2021-01-13 DIAGNOSIS — I4510 Unspecified right bundle-branch block: Secondary | ICD-10-CM | POA: Diagnosis not present

## 2021-01-13 DIAGNOSIS — I69398 Other sequelae of cerebral infarction: Secondary | ICD-10-CM | POA: Diagnosis not present

## 2021-01-13 DIAGNOSIS — I69328 Other speech and language deficits following cerebral infarction: Secondary | ICD-10-CM | POA: Diagnosis not present

## 2021-01-13 DIAGNOSIS — I10 Essential (primary) hypertension: Secondary | ICD-10-CM | POA: Diagnosis not present

## 2021-01-13 DIAGNOSIS — E7800 Pure hypercholesterolemia, unspecified: Secondary | ICD-10-CM | POA: Diagnosis not present

## 2021-01-13 DIAGNOSIS — L89312 Pressure ulcer of right buttock, stage 2: Secondary | ICD-10-CM | POA: Diagnosis not present

## 2021-01-13 DIAGNOSIS — H5462 Unqualified visual loss, left eye, normal vision right eye: Secondary | ICD-10-CM | POA: Diagnosis not present

## 2021-01-14 DIAGNOSIS — E7800 Pure hypercholesterolemia, unspecified: Secondary | ICD-10-CM | POA: Diagnosis not present

## 2021-01-14 DIAGNOSIS — I4510 Unspecified right bundle-branch block: Secondary | ICD-10-CM | POA: Diagnosis not present

## 2021-01-14 DIAGNOSIS — R7303 Prediabetes: Secondary | ICD-10-CM | POA: Diagnosis not present

## 2021-01-14 DIAGNOSIS — I10 Essential (primary) hypertension: Secondary | ICD-10-CM | POA: Diagnosis not present

## 2021-01-14 DIAGNOSIS — Z7902 Long term (current) use of antithrombotics/antiplatelets: Secondary | ICD-10-CM | POA: Diagnosis not present

## 2021-01-14 DIAGNOSIS — I69398 Other sequelae of cerebral infarction: Secondary | ICD-10-CM | POA: Diagnosis not present

## 2021-01-14 DIAGNOSIS — I69354 Hemiplegia and hemiparesis following cerebral infarction affecting left non-dominant side: Secondary | ICD-10-CM | POA: Diagnosis not present

## 2021-01-14 DIAGNOSIS — L89312 Pressure ulcer of right buttock, stage 2: Secondary | ICD-10-CM | POA: Diagnosis not present

## 2021-01-14 DIAGNOSIS — Z9181 History of falling: Secondary | ICD-10-CM | POA: Diagnosis not present

## 2021-01-14 DIAGNOSIS — I69328 Other speech and language deficits following cerebral infarction: Secondary | ICD-10-CM | POA: Diagnosis not present

## 2021-01-14 DIAGNOSIS — H5462 Unqualified visual loss, left eye, normal vision right eye: Secondary | ICD-10-CM | POA: Diagnosis not present

## 2021-01-15 DIAGNOSIS — E7800 Pure hypercholesterolemia, unspecified: Secondary | ICD-10-CM | POA: Diagnosis not present

## 2021-01-15 DIAGNOSIS — I4510 Unspecified right bundle-branch block: Secondary | ICD-10-CM | POA: Diagnosis not present

## 2021-01-15 DIAGNOSIS — I69328 Other speech and language deficits following cerebral infarction: Secondary | ICD-10-CM | POA: Diagnosis not present

## 2021-01-15 DIAGNOSIS — H5462 Unqualified visual loss, left eye, normal vision right eye: Secondary | ICD-10-CM | POA: Diagnosis not present

## 2021-01-15 DIAGNOSIS — Z7902 Long term (current) use of antithrombotics/antiplatelets: Secondary | ICD-10-CM | POA: Diagnosis not present

## 2021-01-15 DIAGNOSIS — R7303 Prediabetes: Secondary | ICD-10-CM | POA: Diagnosis not present

## 2021-01-15 DIAGNOSIS — I69354 Hemiplegia and hemiparesis following cerebral infarction affecting left non-dominant side: Secondary | ICD-10-CM | POA: Diagnosis not present

## 2021-01-15 DIAGNOSIS — I69398 Other sequelae of cerebral infarction: Secondary | ICD-10-CM | POA: Diagnosis not present

## 2021-01-15 DIAGNOSIS — I10 Essential (primary) hypertension: Secondary | ICD-10-CM | POA: Diagnosis not present

## 2021-01-15 DIAGNOSIS — Z9181 History of falling: Secondary | ICD-10-CM | POA: Diagnosis not present

## 2021-01-15 DIAGNOSIS — L89312 Pressure ulcer of right buttock, stage 2: Secondary | ICD-10-CM | POA: Diagnosis not present

## 2021-01-18 DIAGNOSIS — I69328 Other speech and language deficits following cerebral infarction: Secondary | ICD-10-CM | POA: Diagnosis not present

## 2021-01-18 DIAGNOSIS — L89312 Pressure ulcer of right buttock, stage 2: Secondary | ICD-10-CM | POA: Diagnosis not present

## 2021-01-18 DIAGNOSIS — I4510 Unspecified right bundle-branch block: Secondary | ICD-10-CM | POA: Diagnosis not present

## 2021-01-18 DIAGNOSIS — I10 Essential (primary) hypertension: Secondary | ICD-10-CM | POA: Diagnosis not present

## 2021-01-18 DIAGNOSIS — I69354 Hemiplegia and hemiparesis following cerebral infarction affecting left non-dominant side: Secondary | ICD-10-CM | POA: Diagnosis not present

## 2021-01-18 DIAGNOSIS — R7303 Prediabetes: Secondary | ICD-10-CM | POA: Diagnosis not present

## 2021-01-18 DIAGNOSIS — I69398 Other sequelae of cerebral infarction: Secondary | ICD-10-CM | POA: Diagnosis not present

## 2021-01-18 DIAGNOSIS — Z9181 History of falling: Secondary | ICD-10-CM | POA: Diagnosis not present

## 2021-01-18 DIAGNOSIS — Z7902 Long term (current) use of antithrombotics/antiplatelets: Secondary | ICD-10-CM | POA: Diagnosis not present

## 2021-01-18 DIAGNOSIS — E7800 Pure hypercholesterolemia, unspecified: Secondary | ICD-10-CM | POA: Diagnosis not present

## 2021-01-18 DIAGNOSIS — H5462 Unqualified visual loss, left eye, normal vision right eye: Secondary | ICD-10-CM | POA: Diagnosis not present

## 2021-01-19 DIAGNOSIS — H5462 Unqualified visual loss, left eye, normal vision right eye: Secondary | ICD-10-CM | POA: Diagnosis not present

## 2021-01-19 DIAGNOSIS — R7303 Prediabetes: Secondary | ICD-10-CM | POA: Diagnosis not present

## 2021-01-19 DIAGNOSIS — I4510 Unspecified right bundle-branch block: Secondary | ICD-10-CM | POA: Diagnosis not present

## 2021-01-19 DIAGNOSIS — L89312 Pressure ulcer of right buttock, stage 2: Secondary | ICD-10-CM | POA: Diagnosis not present

## 2021-01-19 DIAGNOSIS — I69398 Other sequelae of cerebral infarction: Secondary | ICD-10-CM | POA: Diagnosis not present

## 2021-01-19 DIAGNOSIS — Z7902 Long term (current) use of antithrombotics/antiplatelets: Secondary | ICD-10-CM | POA: Diagnosis not present

## 2021-01-19 DIAGNOSIS — Z9181 History of falling: Secondary | ICD-10-CM | POA: Diagnosis not present

## 2021-01-19 DIAGNOSIS — I69354 Hemiplegia and hemiparesis following cerebral infarction affecting left non-dominant side: Secondary | ICD-10-CM | POA: Diagnosis not present

## 2021-01-19 DIAGNOSIS — I69328 Other speech and language deficits following cerebral infarction: Secondary | ICD-10-CM | POA: Diagnosis not present

## 2021-01-19 DIAGNOSIS — E7800 Pure hypercholesterolemia, unspecified: Secondary | ICD-10-CM | POA: Diagnosis not present

## 2021-01-19 DIAGNOSIS — I10 Essential (primary) hypertension: Secondary | ICD-10-CM | POA: Diagnosis not present

## 2021-01-21 ENCOUNTER — Inpatient Hospital Stay: Payer: Medicare Other | Admitting: Adult Health

## 2021-01-21 DIAGNOSIS — H5462 Unqualified visual loss, left eye, normal vision right eye: Secondary | ICD-10-CM | POA: Diagnosis not present

## 2021-01-21 DIAGNOSIS — I69398 Other sequelae of cerebral infarction: Secondary | ICD-10-CM | POA: Diagnosis not present

## 2021-01-21 DIAGNOSIS — R7303 Prediabetes: Secondary | ICD-10-CM | POA: Diagnosis not present

## 2021-01-21 DIAGNOSIS — Z9181 History of falling: Secondary | ICD-10-CM | POA: Diagnosis not present

## 2021-01-21 DIAGNOSIS — E7800 Pure hypercholesterolemia, unspecified: Secondary | ICD-10-CM | POA: Diagnosis not present

## 2021-01-21 DIAGNOSIS — I10 Essential (primary) hypertension: Secondary | ICD-10-CM | POA: Diagnosis not present

## 2021-01-21 DIAGNOSIS — I69354 Hemiplegia and hemiparesis following cerebral infarction affecting left non-dominant side: Secondary | ICD-10-CM | POA: Diagnosis not present

## 2021-01-21 DIAGNOSIS — L89312 Pressure ulcer of right buttock, stage 2: Secondary | ICD-10-CM | POA: Diagnosis not present

## 2021-01-21 DIAGNOSIS — I69328 Other speech and language deficits following cerebral infarction: Secondary | ICD-10-CM | POA: Diagnosis not present

## 2021-01-21 DIAGNOSIS — I4510 Unspecified right bundle-branch block: Secondary | ICD-10-CM | POA: Diagnosis not present

## 2021-01-21 DIAGNOSIS — Z7902 Long term (current) use of antithrombotics/antiplatelets: Secondary | ICD-10-CM | POA: Diagnosis not present

## 2021-01-26 DIAGNOSIS — M6281 Muscle weakness (generalized): Secondary | ICD-10-CM | POA: Diagnosis not present

## 2021-01-26 DIAGNOSIS — R262 Difficulty in walking, not elsewhere classified: Secondary | ICD-10-CM | POA: Diagnosis not present

## 2021-01-26 DIAGNOSIS — R2689 Other abnormalities of gait and mobility: Secondary | ICD-10-CM | POA: Diagnosis not present

## 2021-01-27 DIAGNOSIS — R7303 Prediabetes: Secondary | ICD-10-CM | POA: Diagnosis not present

## 2021-01-27 DIAGNOSIS — Z9181 History of falling: Secondary | ICD-10-CM | POA: Diagnosis not present

## 2021-01-27 DIAGNOSIS — E7800 Pure hypercholesterolemia, unspecified: Secondary | ICD-10-CM | POA: Diagnosis not present

## 2021-01-27 DIAGNOSIS — Z7902 Long term (current) use of antithrombotics/antiplatelets: Secondary | ICD-10-CM | POA: Diagnosis not present

## 2021-01-27 DIAGNOSIS — I10 Essential (primary) hypertension: Secondary | ICD-10-CM | POA: Diagnosis not present

## 2021-01-27 DIAGNOSIS — L89312 Pressure ulcer of right buttock, stage 2: Secondary | ICD-10-CM | POA: Diagnosis not present

## 2021-01-27 DIAGNOSIS — I4510 Unspecified right bundle-branch block: Secondary | ICD-10-CM | POA: Diagnosis not present

## 2021-01-27 DIAGNOSIS — I69354 Hemiplegia and hemiparesis following cerebral infarction affecting left non-dominant side: Secondary | ICD-10-CM | POA: Diagnosis not present

## 2021-01-27 DIAGNOSIS — I69328 Other speech and language deficits following cerebral infarction: Secondary | ICD-10-CM | POA: Diagnosis not present

## 2021-01-27 DIAGNOSIS — H5462 Unqualified visual loss, left eye, normal vision right eye: Secondary | ICD-10-CM | POA: Diagnosis not present

## 2021-01-27 DIAGNOSIS — I69398 Other sequelae of cerebral infarction: Secondary | ICD-10-CM | POA: Diagnosis not present

## 2021-01-28 DIAGNOSIS — I10 Essential (primary) hypertension: Secondary | ICD-10-CM | POA: Diagnosis not present

## 2021-01-28 DIAGNOSIS — L89312 Pressure ulcer of right buttock, stage 2: Secondary | ICD-10-CM | POA: Diagnosis not present

## 2021-01-28 DIAGNOSIS — Z7902 Long term (current) use of antithrombotics/antiplatelets: Secondary | ICD-10-CM | POA: Diagnosis not present

## 2021-01-28 DIAGNOSIS — R809 Proteinuria, unspecified: Secondary | ICD-10-CM | POA: Diagnosis not present

## 2021-01-28 DIAGNOSIS — E7800 Pure hypercholesterolemia, unspecified: Secondary | ICD-10-CM | POA: Diagnosis not present

## 2021-01-28 DIAGNOSIS — I69328 Other speech and language deficits following cerebral infarction: Secondary | ICD-10-CM | POA: Diagnosis not present

## 2021-01-28 DIAGNOSIS — N1832 Chronic kidney disease, stage 3b: Secondary | ICD-10-CM | POA: Diagnosis not present

## 2021-01-28 DIAGNOSIS — I69354 Hemiplegia and hemiparesis following cerebral infarction affecting left non-dominant side: Secondary | ICD-10-CM | POA: Diagnosis not present

## 2021-01-28 DIAGNOSIS — E211 Secondary hyperparathyroidism, not elsewhere classified: Secondary | ICD-10-CM | POA: Diagnosis not present

## 2021-01-28 DIAGNOSIS — I69398 Other sequelae of cerebral infarction: Secondary | ICD-10-CM | POA: Diagnosis not present

## 2021-01-28 DIAGNOSIS — H5462 Unqualified visual loss, left eye, normal vision right eye: Secondary | ICD-10-CM | POA: Diagnosis not present

## 2021-01-28 DIAGNOSIS — R7303 Prediabetes: Secondary | ICD-10-CM | POA: Diagnosis not present

## 2021-01-28 DIAGNOSIS — I4510 Unspecified right bundle-branch block: Secondary | ICD-10-CM | POA: Diagnosis not present

## 2021-01-28 DIAGNOSIS — Z9181 History of falling: Secondary | ICD-10-CM | POA: Diagnosis not present

## 2021-01-28 DIAGNOSIS — I129 Hypertensive chronic kidney disease with stage 1 through stage 4 chronic kidney disease, or unspecified chronic kidney disease: Secondary | ICD-10-CM | POA: Diagnosis not present

## 2021-01-28 DIAGNOSIS — D638 Anemia in other chronic diseases classified elsewhere: Secondary | ICD-10-CM | POA: Diagnosis not present

## 2021-02-01 DIAGNOSIS — Z9181 History of falling: Secondary | ICD-10-CM | POA: Diagnosis not present

## 2021-02-01 DIAGNOSIS — E7800 Pure hypercholesterolemia, unspecified: Secondary | ICD-10-CM | POA: Diagnosis not present

## 2021-02-01 DIAGNOSIS — I69398 Other sequelae of cerebral infarction: Secondary | ICD-10-CM | POA: Diagnosis not present

## 2021-02-01 DIAGNOSIS — I4510 Unspecified right bundle-branch block: Secondary | ICD-10-CM | POA: Diagnosis not present

## 2021-02-01 DIAGNOSIS — R7303 Prediabetes: Secondary | ICD-10-CM | POA: Diagnosis not present

## 2021-02-01 DIAGNOSIS — H5462 Unqualified visual loss, left eye, normal vision right eye: Secondary | ICD-10-CM | POA: Diagnosis not present

## 2021-02-01 DIAGNOSIS — I69354 Hemiplegia and hemiparesis following cerebral infarction affecting left non-dominant side: Secondary | ICD-10-CM | POA: Diagnosis not present

## 2021-02-01 DIAGNOSIS — I69328 Other speech and language deficits following cerebral infarction: Secondary | ICD-10-CM | POA: Diagnosis not present

## 2021-02-01 DIAGNOSIS — I10 Essential (primary) hypertension: Secondary | ICD-10-CM | POA: Diagnosis not present

## 2021-02-01 DIAGNOSIS — L89312 Pressure ulcer of right buttock, stage 2: Secondary | ICD-10-CM | POA: Diagnosis not present

## 2021-02-01 DIAGNOSIS — Z7902 Long term (current) use of antithrombotics/antiplatelets: Secondary | ICD-10-CM | POA: Diagnosis not present

## 2021-02-02 DIAGNOSIS — I129 Hypertensive chronic kidney disease with stage 1 through stage 4 chronic kidney disease, or unspecified chronic kidney disease: Secondary | ICD-10-CM | POA: Diagnosis not present

## 2021-02-02 DIAGNOSIS — D638 Anemia in other chronic diseases classified elsewhere: Secondary | ICD-10-CM | POA: Diagnosis not present

## 2021-02-02 DIAGNOSIS — R809 Proteinuria, unspecified: Secondary | ICD-10-CM | POA: Diagnosis not present

## 2021-02-02 DIAGNOSIS — E211 Secondary hyperparathyroidism, not elsewhere classified: Secondary | ICD-10-CM | POA: Diagnosis not present

## 2021-02-02 DIAGNOSIS — N1832 Chronic kidney disease, stage 3b: Secondary | ICD-10-CM | POA: Diagnosis not present

## 2021-02-10 DIAGNOSIS — N1831 Chronic kidney disease, stage 3a: Secondary | ICD-10-CM | POA: Diagnosis not present

## 2021-02-10 DIAGNOSIS — E8809 Other disorders of plasma-protein metabolism, not elsewhere classified: Secondary | ICD-10-CM | POA: Diagnosis not present

## 2021-02-10 DIAGNOSIS — D638 Anemia in other chronic diseases classified elsewhere: Secondary | ICD-10-CM | POA: Diagnosis not present

## 2021-02-10 DIAGNOSIS — I129 Hypertensive chronic kidney disease with stage 1 through stage 4 chronic kidney disease, or unspecified chronic kidney disease: Secondary | ICD-10-CM | POA: Diagnosis not present

## 2021-02-10 DIAGNOSIS — M19012 Primary osteoarthritis, left shoulder: Secondary | ICD-10-CM | POA: Diagnosis not present

## 2021-02-10 DIAGNOSIS — R809 Proteinuria, unspecified: Secondary | ICD-10-CM | POA: Diagnosis not present

## 2021-02-10 DIAGNOSIS — I10 Essential (primary) hypertension: Secondary | ICD-10-CM | POA: Diagnosis not present

## 2021-02-12 ENCOUNTER — Other Ambulatory Visit: Payer: Self-pay

## 2021-02-12 NOTE — Patient Outreach (Signed)
[place] [facility]) Care Management  02/12/2021  PROCTOR CARRIKER 10/17/1929 [id number]   First telephone outreach attempt to obtain mRS. No answer. Unable to leave message for returned call.  Philmore Pali [facility] Management Assistant [phone]

## 2021-02-15 ENCOUNTER — Other Ambulatory Visit: Payer: Self-pay

## 2021-02-15 NOTE — Patient Outreach (Signed)
[place] [facility]) Care Management  02/15/2021  JASYN MEY 1930/01/21 [id number]   Second telephone outreach attempt to obtain mRS. No answer. Left message for returned call.  Philmore Pali [facility] Management Assistant [phone]

## 2021-02-17 ENCOUNTER — Other Ambulatory Visit: Payer: Self-pay

## 2021-02-17 NOTE — Patient Outreach (Signed)
[place] [facility]) Care Management  02/17/2021  [name] 1929-04-16 [id number]   3 outreach attempts were completed to obtain mRs. mRs could not be obtained because patient never returned my calls. mRs=7    [place] Management Assistant [phone]

## 2021-02-22 NOTE — Progress Notes (Deleted)
[facility] [address] [place]. [place] [address] [phone]       HOSPITAL FOLLOW UP NOTE  Eduardo Vasquez Date of Birth:  May 26, 1929 Medical Record Number:  [id number]   Reason for Referral:  hospital stroke follow up    SUBJECTIVE:   CHIEF COMPLAINT:  No chief complaint on file.   HPI:   Eduardo Vasquez is an 87 y.o. 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 Able to say some words but not making sense.  Personally reviewed hospitalization pertinent progress notes, lab work and imaging. Eval by teleneurology - NIHSS 6 - tPA administered and transferred to [facility] for further evaluation/management. Evaluated by Dr. Leonie Man - stroke workup revealed right basal ganglia/CR stroke s/p IV tPA, embolic secondary to unknown source.  Imaging also showed prior SAH and underlying chronic microhemorrhages in posterior right cerebral hemisphere.  Carotid ultrasound bilateral 1 to 39% stenosis.  TCD study suboptimal.  EF 55 to 60%, LA normal size.  A1c 6.4.  LDL 82. Deferred further cardiac work-up as he is not a good candidate for [facility] due to dementia and microhemorrhages with concern of CAA.  Initiated Plavix and atorvastatin for secondary stroke prevention - on asa PTA.  PT/OT recommended HH PT//OT.  -ER eval at [facility] 8/31 for blank stare and decreased responsiveness. CTH unremarkable. Returned back to baseline therefore d/c'd home -Admmission 9/6-9/9 at [facility] for generalized weakness with poor oral intake. Repeat MR brain showed recurrent acute ischemia and right BG. Again, further cardiac work-up deferred. EEG  negative. Eval by Dr. Curly [place] who recommended continue Plavix and atorvastatin.  PT/OT recommended SNF. Referred to [facility] neurology for f/u.  - ER eval at [facility] 9/10 due to worsening mental status, unable to talk in hypertensive emergency. Per note review, apparently missed dosages of amlodipine and advised to restart. BP eventually stabilized. CTH negative for acute stroke. Concern of possible asymptomatic left-sided pneumonia and discharged on Omnicef and azithromycin. SLP eval further downgraded diet to dysphagia 1. Concern of poor prognosis and discussed consideration of hospice referral if fails to improve with rehab.  Transferred back to [facility]  11/21/2020 CT head wo contrast IMPRESSION: Focal hypodensity in the right periventricular region corresponding to evolution of recent infarct identified on MRI. Chronic white matter disease and associated intracranial atherosclerosis.   11/18/2020 MRI brain wo contrast IMPRESSION: 1. Recurrent acute ischemia in the right basal ganglia. 2. Severe chronic small vessel ischemic disease. 3. No major intracranial arterial occlusion or high-grade proximal stenosis.  11/07/20 CT Head WO Contrast  1. No evidence of acute intracranial abnormality.  ASPECTS of 10. 2. Severe chronic small vessel ischemic disease.   11/08/20 MRI Brain WO Contrast  1. Small acute right basal ganglia/corona radiata infarct. 2. Severe chronic small vessel ischemic disease. 3. Evidence of previous subarachnoid hemorrhage and underlying chronic parenchymal microhemorrhages in the posterior right cerebral hemisphere, query cerebral amyloid angiopathy.   11/09/20 Echo Complete   1. Left ventricular ejection fraction, by estimation, is 55 to 60%. The  left  ventricle has normal function. The left ventricle has no regional  wall motion abnormalities. There is mild focal basal septal left  ventricular hypertrophy. Left ventricular   diastolic parameters are indeterminate.   2. Right ventricular systolic function is normal. The right ventricular  size is normal. Tricuspid regurgitation signal is inadequate for assessing  PA pressure.   3. The mitral valve is normal in structure. No evidence of mitral valve  regurgitation. No evidence of mitral stenosis.   4. The aortic valve is tricuspid. Aortic valve regurgitation is trivial.  Mild aortic valve sclerosis is present, with no evidence of aortic valve  stenosis.   5. The inferior vena cava is normal in size with greater than 50%  respiratory variability, suggesting right atrial pressure of 3 mmHg.   6. Technically difficult study with poor acoustic windows. 11/09/20  CUS Bilateral  Right Carotid: Velocities in the right ICA are consistent with a 1-39%  stenosis.   Left Carotid: Velocities in the left ICA are consistent with a 1-39% stenosis, however velocities may be underestimated due to calcific shadowing.   Vertebrals:  Bilateral vertebral arteries demonstrate antegrade flow.  Subclavians: Normal flow hemodynamics were seen in bilateral subclavian arteries.    11/09/20 TCD  Highly suboptimal study with poor windows throughout. antegrade flow  noted in both opthalmic arteries     A1C  Lipid Panel     Component Value Date/Time   CHOL 144 11/08/2020 0137   TRIG 48 11/08/2020 0137   HDL 52 11/08/2020 0137   CHOLHDL 2.8 11/08/2020 0137   VLDL 10 11/08/2020 0137   LDLCALC 82 11/08/2020 0137         ROS:   14 system review of systems performed and negative with exception of those listed in HPI  PMH:  Past Medical History:  Diagnosis Date   Cancer ([place]) 2009   Prostate   Carotid artery occlusion    Dementia ([place])    Hypercholesteremia    Hypertension    Stroke [facility]) 2003   Mini - Left Eye- Partial Blindness   Urgency incontinence     PSH:  Past Surgical History:  Procedure Laterality Date   INGUINAL HERNIA REPAIR Right 12/02/2013    Procedure: HERNIA REPAIR INGUINAL ADULT WITH MESH;  Surgeon: Jamesetta So, MD;  Location: [facility] ORS;  Service: General;  Laterality: Right;   INSERTION OF MESH Right 12/02/2013   Procedure: INSERTION OF MESH;  Surgeon: Jamesetta So, MD;  Location: [facility] ORS;  Service: General;  Laterality: Right;    Social History:  Social History   Socioeconomic History   Marital status: Widowed    Spouse name: Not on file   Number of children: 2   Years of education: 8   Highest education level: Not on file  Occupational History   Occupation: retired    Comment: [employer]  Tobacco Use   Smoking status: Never   Smokeless tobacco: Never  Substance and Sexual Activity   Alcohol use: No    Alcohol/week: 0.0 standard drinks    Comment: 27 years alcohol free in [facility]   Drug use: No   Sexual activity: Yes    Birth control/protection: None  Other Topics Concern   Not on file  Social History Narrative   01/26/16 daughter stayting with patient for now   No caffeine   Social Determinants of Health   Financial Resource Strain: Not on file  Food Insecurity: Not on file  Transportation Needs: Not on file  Physical Activity: Not on file  Stress: Not on file  Social Connections: Not on file  Intimate Partner Violence: Not on file    Family History:  Family History  Problem Relation Age of Onset   Hypertension Mother    Hyperlipidemia Mother    Hypertension Father    Hyperlipidemia Father    Diabetes Daughter    Prostate cancer Brother    Hypertension Brother     Medications:   Current Outpatient Medications on File Prior to Visit  Medication Sig Dispense Refill   acetaminophen (TYLENOL) 160 MG/5ML elixir Take 20 mLs (640 mg total) by mouth every 4 (four) hours as needed for fever or pain. 120 mL 0   amLODipine (NORVASC) 10 MG tablet Take 1 tablet (10 mg total) by mouth daily. 30 tablet 1   atorvastatin (LIPITOR) 40 MG tablet Take 1 tablet (40 mg total) by mouth daily. 30 tablet 3    donepezil (ARICEPT) 10 MG tablet TAKE 1 TABLET(10 MG) BY MOUTH AT BEDTIME (Patient taking differently: Take 10 mg by mouth at bedtime.) 30 tablet 0   doxazosin (CARDURA) 4 MG tablet Take 1 tablet (4 mg total) by mouth daily. 30 tablet 0   guaiFENesin (ROBITUSSIN) 100 MG/5ML SOLN Take 10 mLs (200 mg total) by mouth every 4 (four) hours as needed for cough or to loosen phlegm. 236 mL 0   levothyroxine (SYNTHROID) 75 MCG tablet Take 1 tablet (75 mcg total) by mouth daily. 30 tablet 0   memantine (NAMENDA) 10 MG tablet Take 1 tablet (10 mg total) by mouth 2 (two) times daily. 60 tablet 12   Multiple Vitamin (MULTIVITAMIN) tablet Take 1 tablet by mouth daily. 30 tablet 2   No current facility-administered medications on file prior to visit.    Allergies:  No Known Allergies    OBJECTIVE:  Physical Exam  There were no vitals filed for this visit. There is no height or weight on file to calculate BMI. No results found.  No flowsheet data found.   General: well developed, well nourished, seated, in no evident distress Head: head normocephalic and atraumatic.   Neck: supple with no carotid or supraclavicular bruits Cardiovascular: regular rate and rhythm, no murmurs Musculoskeletal: no deformity Skin:  no rash/petichiae Vascular:  Normal pulses all extremities   Neurologic Exam Mental Status: Awake and fully alert.  Fluent speech and language.  Oriented to place and time. Recent and remote memory intact. Attention span, concentration and fund of knowledge appropriate. Mood and affect appropriate.  Cranial Nerves: Fundoscopic exam reveals sharp disc margins. Pupils equal, briskly reactive to light. Extraocular movements full without nystagmus. Visual fields full to confrontation. Hearing intact. Facial sensation intact. Face, tongue, palate moves normally and symmetrically.  Motor: Normal bulk and tone. Normal strength in all tested extremity muscles Sensory.: intact to touch , pinprick ,  position and vibratory sensation.  Coordination: Rapid alternating movements normal in all extremities. Finger-to-nose and heel-to-shin performed accurately bilaterally. Gait and Station: Arises from chair without difficulty. Stance is normal. Gait demonstrates normal stride length and balance with ***. Tandem walk and heel toe ***.  Reflexes: 1+ and symmetric. Toes downgoing.     NIHSS  *** Modified Rankin  ***      ASSESSMENT: Eduardo Vasquez is a 87 y.o. year old male with recurrent right basal ganglia stroke on 11/07/2020 and 08/15/4032, embolic secondary to unknown source. Vascular risk factors include dementia, HTN, HLD, CAD, possible CAA and hx of prior stroke.  PLAN:  Recurrent R BG strokes : Residual deficit: ***. No indication to pursue further cardiac work-up as he would not be a good AC candidate in setting of dementia and concern of  possible CAA. continue clopidogrel 75 mg daily  and atorvastatin for secondary stroke prevention.  Discussed secondary stroke prevention measures and importance of close PCP follow up for aggressive stroke risk factor management. I have gone over the pathophysiology of stroke, warning signs and symptoms, risk factors and their management in some detail with instructions to go to the closest emergency room for symptoms of concern. HTN: BP goal <130/90.  Stable on *** per PCP HLD: LDL goal <70. Recent LDL 82.  Continue atorvastatin 40 mg daily DMII: A1c goal<7.0. Recent A1c 6.4.     Follow up in *** or call earlier if needed   CC:  [facility] provider: Dr. Leonie Man PCP: Rosita Fire, MD    I spent *** minutes of face-to-face and non-face-to-face time with patient.  This included previsit chart review including review of recent hospitalization, lab review, study review, order entry, electronic health record documentation, patient education regarding recent stroke including etiology, secondary stroke prevention measures and importance of managing  stroke risk factors, residual deficits and typical recovery time and answered all other questions to patient satisfaction   Frann Rider, AGNP-BC  [facility] [address] [place], [place] [address]  Phone [phone] Fax [phone] Note: This document was prepared with digital dictation and possible smart phrase technology. Any transcriptional errors that result from this process are unintentional.

## 2021-02-23 ENCOUNTER — Encounter: Payer: Self-pay | Admitting: Adult Health

## 2021-02-23 ENCOUNTER — Ambulatory Visit (INDEPENDENT_AMBULATORY_CARE_PROVIDER_SITE_OTHER): Payer: Medicare Other | Admitting: Adult Health

## 2021-02-23 ENCOUNTER — Inpatient Hospital Stay: Payer: Medicare Other | Admitting: Adult Health

## 2021-02-23 VITALS — BP 136/84 | HR 68

## 2021-02-23 DIAGNOSIS — I639 Cerebral infarction, unspecified: Secondary | ICD-10-CM

## 2021-02-23 DIAGNOSIS — R269 Unspecified abnormalities of gait and mobility: Secondary | ICD-10-CM | POA: Diagnosis not present

## 2021-02-23 DIAGNOSIS — I69398 Other sequelae of cerebral infarction: Secondary | ICD-10-CM | POA: Diagnosis not present

## 2021-02-23 DIAGNOSIS — I69391 Dysphagia following cerebral infarction: Secondary | ICD-10-CM

## 2021-02-23 DIAGNOSIS — I69319 Unspecified symptoms and signs involving cognitive functions following cerebral infarction: Secondary | ICD-10-CM

## 2021-02-23 MED ORDER — CLOPIDOGREL BISULFATE 75 MG PO TABS: 75.0000 mg | ORAL_TABLET | Freq: Every day | ORAL | 11 refills | Status: AC

## 2021-02-23 NOTE — Patient Instructions (Signed)
restart clopidogrel 75 mg daily  and atorvastatin  for secondary stroke prevention  Please stop aspirin at this time  You will be called to restart therapies at [facility] rehab in [place], [place]   Continue to follow up with PCP regarding cholesterol and blood pressure management  Maintain strict control of hypertension with blood pressure goal below 130/90 and cholesterol with LDL cholesterol (bad cholesterol) goal below 70 mg/dL.   Signs of a Stroke? Follow the BEFAST method:  Balance Watch for a sudden loss of balance, trouble with coordination or vertigo Eyes Is there a sudden loss of vision in one or both eyes? Or double vision?  Face: Ask the person to smile. Does one side of the face droop or is it numb?  Arms: Ask the person to raise both arms. Does one arm drift downward? Is there weakness or numbness of a leg? Speech: Ask the person to repeat a simple phrase. Does the speech sound slurred/strange? Is the person confused ? Time: If you observe any of these signs, call 911.     Followup in the future with me in 4 months or call earlier if needed       Thank you for coming to see [place] at [facility]. I hope we have been able to provide you high quality care today.  You may receive a patient satisfaction survey over the next few weeks. We would appreciate your feedback and comments so that we may continue to improve ourselves and the health of our patients.

## 2021-02-23 NOTE — Progress Notes (Signed)
[facility] [address] [place]. [place] [address] [phone]       HOSPITAL FOLLOW UP NOTE  Eduardo Vasquez Date of Birth:  [birth year] Medical Record Number:  [id number]   Reason for Referral:  hospital stroke follow up    SUBJECTIVE:   CHIEF COMPLAINT:  Chief Complaint  Patient presents with   Cerebrovascular Accident    Rm 2 hospital FU, great nephew  "completed PT; weakness and c/o of back pain"     HPI:   Eduardo Vasquez is an 87 y.o. 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 Able to say some words but not making sense.  Personally reviewed hospitalization pertinent progress notes, lab work and imaging. Eval by teleneurology - NIHSS 6 - tPA administered and transferred to [facility] for further evaluation/management. Evaluated by Dr. Leonie Man - stroke workup revealed right basal ganglia/CR stroke s/p IV tPA, embolic secondary to unknown source.  Imaging also showed prior SAH and underlying chronic microhemorrhages in posterior right cerebral hemisphere.  Carotid ultrasound bilateral 1 to 39% stenosis.  TCD study suboptimal.  EF 55 to 60%, LA normal size.  A1c 6.4.  LDL 82. Deferred further cardiac work-up as he is not a good candidate for [facility] due to dementia and microhemorrhages with concern of CAA.  Initiated Plavix and atorvastatin for secondary stroke prevention - on asa PTA.  PT/OT recommended HH PT//OT.  -ER eval at [facility] 8/31 for blank stare and decreased responsiveness. CTH unremarkable. Returned back to baseline therefore d/c'd home -Admmission 9/6-9/9 at [facility] for generalized weakness with  poor oral intake. Repeat MR brain showed recurrent acute ischemia and right BG. Again, further cardiac work-up deferred. EEG negative. Eval by Dr. Curly Shores who recommended continue Plavix and atorvastatin.  PT/OT recommended SNF. Referred to [facility] neurology for f/u.  - ER eval at [facility] 9/10 due to worsening mental status, unable to talk in hypertensive emergency. Per note review, apparently missed dosages of amlodipine and advised to restart. BP eventually stabilized. CTH negative for acute stroke. Concern of possible asymptomatic left-sided pneumonia and discharged on Omnicef and azithromycin. SLP eval further downgraded diet to dysphagia 1. Concern of poor prognosis and discussed consideration of hospice referral if fails to improve with rehab.  Transferred back to [place]   Today, 02/77/4128, patient being seen for initial hospital follow up accompanied by his nephew who provides history.  He returned back home from SNF at the end of September.  He recently completed home health therapies. Nephew believes he has been making great progress. More fatigued today after increased exertion yesterday and lots of visitors today as it is his birthday. Typically able to ambulate with RW with supervision - denies any recent falls. Lives in own home but has aide assistance as he does need assistance with ADLs. He is able to feed himself after set up.  Incontinent of stool and urine but has been more aware of when he needs to go.  He continues to do exercises daily.  Gradually eating more solid food without difficulty.  Cognition stable without worsening -no behavioral concerns. He has remained  on Aricept and Namenda which was started during hospitalization in September.  Denies new or worsening stroke/TIA symptoms. At some point, Plavix was stopped and restarted on aspirin -denies side effects.  Compliant on atorvastatin 40 mg daily without side effects.  Reports recent lab work by PCP which is stable (unable to view  via epic).  Blood pressure today 136/84. Also currently being followed by nephrology with noted improvement of kidney function.  No further concerns at this time.     PERTINENT IMAGING/LABS  11/21/2020 CT head wo contrast IMPRESSION: Focal hypodensity in the right periventricular region corresponding to evolution of recent infarct identified on MRI. Chronic white matter disease and associated intracranial atherosclerosis.   11/18/2020 MRI brain wo contrast IMPRESSION: 1. Recurrent acute ischemia in the right basal ganglia. 2. Severe chronic small vessel ischemic disease. 3. No major intracranial arterial occlusion or high-grade proximal stenosis.  11/07/20 CT Head WO Contrast  1. No evidence of acute intracranial abnormality.  ASPECTS of 10. 2. Severe chronic small vessel ischemic disease.   11/08/20 MRI Brain WO Contrast  1. Small acute right basal ganglia/corona radiata infarct. 2. Severe chronic small vessel ischemic disease. 3. Evidence of previous subarachnoid hemorrhage and underlying chronic parenchymal microhemorrhages in the posterior right cerebral hemisphere, query cerebral amyloid angiopathy.   11/09/20 Echo Complete   1. Left ventricular ejection fraction, by estimation, is 55 to 60%. The  left ventricle has normal function. The left ventricle has no regional  wall motion abnormalities. There is mild focal basal septal left  ventricular hypertrophy. Left ventricular  diastolic parameters are indeterminate.   2. Right ventricular systolic function is normal. The right ventricular  size is normal. Tricuspid regurgitation signal is inadequate for assessing  PA pressure.   3. The mitral valve is normal in structure. No evidence of mitral valve  regurgitation. No evidence of mitral stenosis.   4. The aortic valve is tricuspid. Aortic valve regurgitation is trivial.  Mild aortic valve sclerosis is present, with no evidence of aortic valve  stenosis.   5. The inferior  vena cava is normal in size with greater than 50%  respiratory variability, suggesting right atrial pressure of 3 mmHg.   6. Technically difficult study with poor acoustic windows. 11/09/20  CUS Bilateral  Right Carotid: Velocities in the right ICA are consistent with a 1-39%  stenosis.   Left Carotid: Velocities in the left ICA are consistent with a 1-39% stenosis, however velocities may be underestimated due to calcific shadowing.   Vertebrals:  Bilateral vertebral arteries demonstrate antegrade flow.  Subclavians: Normal flow hemodynamics were seen in bilateral subclavian arteries.    11/09/20 TCD  Highly suboptimal study with poor windows throughout. antegrade flow  noted in both opthalmic arteries     A1C 6.4  Lipid Panel     Component Value Date/Time   CHOL 144 11/08/2020 0137   TRIG 48 11/08/2020 0137   HDL 52 11/08/2020 0137   CHOLHDL 2.8 11/08/2020 0137   VLDL 10 11/08/2020 0137   LDLCALC 82 11/08/2020 0137         ROS:   N/A d/t cognitive impairment  PMH:  Past Medical History:  Diagnosis Date   Cancer ([place]) 2009   Prostate   Carotid artery occlusion    Dementia ([place])    Hypercholesteremia    Hypertension    Stroke [facility]) 2003   Mini - Left Eye- Partial Blindness   Urgency incontinence     PSH:  Past Surgical History:  Procedure Laterality Date   INGUINAL HERNIA REPAIR Right 12/02/2013   Procedure: HERNIA REPAIR INGUINAL ADULT WITH MESH;  Surgeon: Jamesetta So, MD;  Location: [facility];  Service: General;  Laterality: Right;   INSERTION OF MESH Right 12/02/2013   Procedure: INSERTION OF MESH;  Surgeon: Jamesetta So, MD;  Location: [facility] OR[facility];  Service: General;  Laterality: Right;    Social History:  Social History   Socioeconomic History   Marital status: Widowed    Spouse name: Not on file   Number of children: 2   Years of education: 62   Highest education level: Not on file  Occupational History   Occupation: retired    Comment: [employer]  Tobacco  Tobacco Use   Smoking status: Never   Smokeless tobacco: Never  Substance and Sexual Activity   Alcohol use: No    Alcohol/week: 0.0 standard drinks    Comment: 27 years alcohol free in AA   Drug use: No   Sexual activity: Yes    Birth control/protection: None  Other Topics Concern   Not on file  Social History Narrative   02/23/21 daughter stayting with patient for now, caregiver 8 hrs a day, great nephew comes 3 x daily   No caffeine   Social Determinants of Radio broadcast assistant Strain: Not on file  Food Insecurity: Not on file  Transportation Needs: Not on file  Physical Activity: Not on file  Stress: Not on file  Social Connections: Not on file  Intimate Partner Violence: Not on file    Family History:  Family History  Problem Relation Age of Onset   Hypertension Mother    Hyperlipidemia Mother    Hypertension Father    Hyperlipidemia Father    Diabetes Daughter    Prostate cancer Brother    Hypertension Brother     Medications:   Current Outpatient Medications on File Prior to Visit  Medication Sig Dispense Refill   acetaminophen (TYLENOL) 160 MG/5ML elixir Take 20 mLs (640 mg total) by mouth every 4 (four) hours as needed for fever or pain. 120 mL 0   amLODipine (NORVASC) 10 MG tablet Take 1 tablet (10 mg total) by mouth daily. 30 tablet 1   atorvastatin (LIPITOR) 40 MG tablet Take 1 tablet (40 mg total) by mouth daily. 30 tablet 3   donepezil (ARICEPT) 10 MG tablet TAKE 1 TABLET(10 MG) BY MOUTH AT BEDTIME (Patient taking differently: Take 10 mg by mouth at bedtime.) 30 tablet 0   doxazosin (CARDURA) 4 MG tablet Take 1 tablet (4 mg total) by mouth daily. 30 tablet 0   guaiFENesin (ROBITUSSIN) 100 MG/5ML SOLN Take 10 mLs (200 mg total) by mouth every 4 (four) hours as needed for cough or to loosen phlegm. 236 mL 0   levothyroxine (SYNTHROID) 75 MCG tablet Take 1 tablet (75 mcg total) by mouth daily. 30 tablet 0   memantine (NAMENDA) 10 MG tablet  Take 1 tablet (10 mg total) by mouth 2 (two) times daily. 60 tablet 12   Multiple Vitamin (MULTIVITAMIN) tablet Take 1 tablet by mouth daily. 30 tablet 2   No current facility-administered medications on file prior to visit.    Allergies:  No Known Allergies    OBJECTIVE:  Physical Exam  Vitals:   02/23/21 1514  BP: 136/84  Pulse: 68   There is no height or weight on file to calculate BMI.   General: well developed, well nourished, very pleasant elderly African-American male, seated in  wheelchair, in no evident distress Head: head normocephalic and atraumatic.   Neck: supple with no carotid or supraclavicular bruits Cardiovascular: regular rate and rhythm, no murmurs Musculoskeletal: no deformity Skin:  no rash/petichiae Vascular:  Normal pulses all extremities   Neurologic Exam Mental Status: lethargic but responds without delay. mild dysarthria ?baseline. Unable to appreciate aphasia but limited verbal output.  Follows command with mild difficulty.  Disoriented to place and time. Able to state name and birthday (but was not aware today was his bday or his current age). Recent and remote memory impaired. Attention span, concentration and fund of knowledge impaired with nephew providing history. Mood and affect appropriate.  Cranial Nerves: Fundoscopic exam unable to complete. UTA pupil response. Extraocular movements full without nystagmus. Visual fields limited blink to threat left eye, appropriate right eye.  HOH bilaterally. Facial sensation intact. Face, tongue, palate moves normally and symmetrically.  Motor: Normal bulk and tone. Normal strength in all tested extremity muscles except bilateral hip flexor weakness. Unable to appreciate weakness in BUE Sensory.: intact to touch , pinprick , position and vibratory sensation.  Coordination: Rapid alternating movements slowed in BUE but equal. Finger-to-nose and heel-to-shin performed UTA d/t difficulty understanding  directions. Gait and Station: Deferred Reflexes: 1+ and symmetric. Toes downgoing.     NIHSS  8 (higher due to b/l hip flexor weakness) Modified Rankin  4      ASSESSMENT: Eduardo Vasquez is a 87 y.o. year old male with recurrent right basal ganglia stroke on 11/07/2020 and 05/19/1694, embolic secondary to unknown source with residual dysphagia, dysarthria, gait impairment and cognitive worsening. Vascular risk factors include dementia, pre-DM, HTN, HLD, CAD, possible CAA and hx of prior stroke.  Multiple recurrent admissions month of September (see HPI)     PLAN:  Recurrent R BG strokes :  referral placed to OP PT/OT/SLP for further rehab and potential further recovery.   No indication to pursue further cardiac work-up as he would not be a good [facility] candidate in setting of dementia and concern of  possible CAA.  Restart clopidogrel 75 mg daily and stop aspirin and continue atorvastatin for secondary stroke prevention.   Discussed secondary stroke prevention measures and importance of close PCP follow up for aggressive stroke risk factor management. I have gone over the pathophysiology of stroke, warning signs and symptoms, risk factors and their management in some detail with instructions to go to the closest emergency room for symptoms of concern. HTN: BP goal <130/90.  Stable on current regimen per PCP HLD: LDL goal <70. Recent LDL 82.  Continue atorvastatin 40 mg daily Pre-DMII: A1c goal<7.0. Recent A1c 6.4. not currently on pharmacological management.  Continue follow-up with PCP Dementia: on Aricept and Namenda (started during September hospitalization). Can continue to be monitored by PCP    Follow up in 4 months or call earlier if needed   CC:  [facility] provider: Dr. Leonie Man PCP: Rosita Fire, MD    I spent 59 minutes of face-to-face and non-face-to-face time with patient and nephew.  This included previsit chart review including review of recent hospitalization, lab review,  study review, order entry, electronic health record documentation, patient and nephew education regarding recent recurrent strokes including potential etiology, secondary stroke prevention measures and importance of managing stroke risk factors, residual deficits and typical recovery time and answered all other questions to patient and nephews satisfaction  Frann Rider, [facility]  [facility] [address] [place], [place] [address]  Phone [phone] Fax [phone]  Note: This document was prepared with digital dictation and possible smart phrase technology. Any transcriptional errors that result from this process are unintentional.

## 2021-02-24 NOTE — Progress Notes (Signed)
I agree with the above plan 

## 2021-02-25 DIAGNOSIS — M6281 Muscle weakness (generalized): Secondary | ICD-10-CM | POA: Diagnosis not present

## 2021-02-25 DIAGNOSIS — R2689 Other abnormalities of gait and mobility: Secondary | ICD-10-CM | POA: Diagnosis not present

## 2021-02-25 DIAGNOSIS — R262 Difficulty in walking, not elsewhere classified: Secondary | ICD-10-CM | POA: Diagnosis not present

## 2021-03-02 ENCOUNTER — Emergency Department (HOSPITAL_COMMUNITY): Payer: Medicare Other

## 2021-03-02 ENCOUNTER — Emergency Department (HOSPITAL_COMMUNITY)
Admission: EM | Admit: 2021-03-02 | Discharge: 2021-03-14 | Disposition: E | Payer: Medicare Other | Source: Home / Self Care

## 2021-03-02 ENCOUNTER — Emergency Department (HOSPITAL_COMMUNITY)
Admission: EM | Admit: 2021-03-02 | Discharge: 2021-03-14 | Disposition: E | Payer: Medicare Other | Attending: Emergency Medicine | Admitting: Emergency Medicine

## 2021-03-02 DIAGNOSIS — F0390 Unspecified dementia without behavioral disturbance: Secondary | ICD-10-CM | POA: Insufficient documentation

## 2021-03-02 DIAGNOSIS — R404 Transient alteration of awareness: Secondary | ICD-10-CM | POA: Diagnosis not present

## 2021-03-02 DIAGNOSIS — J690 Pneumonitis due to inhalation of food and vomit: Secondary | ICD-10-CM | POA: Insufficient documentation

## 2021-03-02 DIAGNOSIS — I10 Essential (primary) hypertension: Secondary | ICD-10-CM | POA: Insufficient documentation

## 2021-03-02 DIAGNOSIS — I469 Cardiac arrest, cause unspecified: Secondary | ICD-10-CM | POA: Diagnosis not present

## 2021-03-02 DIAGNOSIS — Z8546 Personal history of malignant neoplasm of prostate: Secondary | ICD-10-CM | POA: Insufficient documentation

## 2021-03-02 DIAGNOSIS — Z7982 Long term (current) use of aspirin: Secondary | ICD-10-CM | POA: Insufficient documentation

## 2021-03-02 DIAGNOSIS — Z743 Need for continuous supervision: Secondary | ICD-10-CM | POA: Diagnosis not present

## 2021-03-02 DIAGNOSIS — Z7902 Long term (current) use of antithrombotics/antiplatelets: Secondary | ICD-10-CM | POA: Insufficient documentation

## 2021-03-02 DIAGNOSIS — R6889 Other general symptoms and signs: Secondary | ICD-10-CM | POA: Diagnosis not present

## 2021-03-02 DIAGNOSIS — R092 Respiratory arrest: Secondary | ICD-10-CM | POA: Insufficient documentation

## 2021-03-02 DIAGNOSIS — I499 Cardiac arrhythmia, unspecified: Secondary | ICD-10-CM | POA: Diagnosis not present

## 2021-03-02 DIAGNOSIS — Z79899 Other long term (current) drug therapy: Secondary | ICD-10-CM | POA: Insufficient documentation

## 2021-03-02 LAB — CBG MONITORING, ED: Glucose-Capillary: 135 mg/dL — ABNORMAL HIGH (ref 70–99)

## 2021-03-02 MED ORDER — FENTANYL CITRATE PF 50 MCG/ML IJ SOSY: 50.0000 ug | PREFILLED_SYRINGE | INTRAMUSCULAR | Status: DC | PRN

## 2021-03-02 MED ORDER — MIDAZOLAM HCL 2 MG/2ML IJ SOLN: 1.0000 mg | INTRAMUSCULAR | Status: DC | PRN

## 2021-03-02 MED ORDER — SODIUM BICARBONATE 8.4 % IV SOLN
INTRAVENOUS | Status: DC | PRN
  Administered 2021-03-02: 50 meq via INTRAVENOUS

## 2021-03-02 MED ORDER — EPINEPHRINE 1 MG/10ML IJ SOSY
PREFILLED_SYRINGE | INTRAMUSCULAR | Status: DC | PRN
  Administered 2021-03-02: 1 mg via INTRAVENOUS

## 2021-03-02 MED ORDER — EPINEPHRINE 1 MG/10ML IJ SOSY
PREFILLED_SYRINGE | INTRAMUSCULAR | Status: DC | PRN
Start: 2021-03-02 — End: 2021-03-02
  Administered 2021-03-02: 1 mg via INTRAVENOUS

## 2021-03-14 NOTE — ED Notes (Signed)
[place], Per representative, pt does NOT qualify for organ donation. Referral # - [id number], Retia Passe ([place]).

## 2021-03-14 NOTE — ED Notes (Signed)
Pt intubated x 1 attempt by Dr Kathrynn Humble without difficulty with 7.5 ET tube 26 at lip. + CO2 change, bilateral breath sounds noted.

## 2021-03-14 NOTE — ED Triage Notes (Signed)
Pt BIB EMS from home, pt was being fed by caregiver, pt choked. EMS uncertain of normal mentation. H/O HTN, stroke, DM. Noted drooling on pt's arrival, unresponsive.

## 2021-03-14 NOTE — ED Notes (Signed)
Pt's family still in the room. Per HPOA, he will call funeral home to make arrangements and will notify this RN when that's done.

## 2021-03-14 NOTE — ED Provider Notes (Addendum)
[facility] Provider Note   CSN: [id number] Arrival date & time: March 15, 2021  1339     History No chief complaint on file.   Eduardo Vasquez is a 88 y.o. male.  HPI    88 year old male comes in with chief complaint of unresponsiveness.  Patient has history of basilar strokes and actually had a stroke recently.  EMS was called by caregiver.  Patient was being fed when he suddenly started choking and became pulseless.  Patient did receive CPR at home.  When paramedics arrived patient had a pulse but he had a GCS of 3.  He was brought to the ER after his blood sugar was noted to be normal.  While patient was getting changed, he became pulseless and CPR was initiated.  Past Medical History:  Diagnosis Date   Cancer [facility]) 2009   Prostate   Carotid artery occlusion    Dementia ([place])    Hypercholesteremia    Hypertension    Stroke [facility]) 2003   Mini - Left Eye- Partial Blindness   Urgency incontinence     Patient Active Problem List   Diagnosis Date Noted   Dementia without behavioral disturbance ([place]) 11/21/2020   Encephalopathy 11/18/2020   Generalized weakness 11/17/2020   Stroke determined by clinical assessment ([place]) 11/07/2020   Acute right MCA stroke ([place]) 11/07/2020   Carotid artery occlusion 11/03/2011    Past Surgical History:  Procedure Laterality Date   INGUINAL HERNIA REPAIR Right 12/02/2013   Procedure: HERNIA REPAIR INGUINAL ADULT WITH MESH;  Surgeon: Jamesetta So, MD;  Location: [facility];  Service: General;  Laterality: Right;   INSERTION OF MESH Right 12/02/2013   Procedure: INSERTION OF MESH;  Surgeon: Jamesetta So, MD;  Location: [facility];  Service: General;  Laterality: Right;       Family History  Problem Relation Age of Onset   Hypertension Mother    Hyperlipidemia Mother    Hypertension Father    Hyperlipidemia Father    Diabetes Daughter    Prostate cancer Brother    Hypertension Brother     Social History    Tobacco Use   Smoking status: Never   Smokeless tobacco: Never  Substance Use Topics   Alcohol use: No    Alcohol/week: 0.0 standard drinks    Comment: 27 years alcohol free in AA   Drug use: No    Home Medications Prior to Admission medications   Medication Sig Start Date End Date Taking? Authorizing Provider  acetaminophen (TYLENOL) 160 MG/5ML elixir Take 20 mLs (640 mg total) by mouth every 4 (four) hours as needed for fever or pain. 11/21/20   Roxan Hockey, MD  amLODipine (NORVASC) 10 MG tablet Take 1 tablet (10 mg total) by mouth daily. 11/20/20 02/23/21  Wyvonnia Dusky, MD  aspirin EC 81 MG tablet Take 81 mg by mouth daily. Swallow whole.    [provider]  atorvastatin (LIPITOR) 40 MG tablet Take 1 tablet (40 mg total) by mouth daily. 11/20/20 02/23/21  Wyvonnia Dusky, MD  clopidogrel (PLAVIX) 75 MG tablet Take 1 tablet (75 mg total) by mouth daily. 02/23/21   Frann Rider, NP  donepezil (ARICEPT) 10 MG tablet TAKE 1 TABLET(10 MG) BY MOUTH AT BEDTIME Patient taking differently: Take 10 mg by mouth at bedtime. 02/27/17   Penumalli, Earlean Polka, MD  doxazosin (CARDURA) 4 MG tablet Take 1 tablet (4 mg total) by mouth daily. 11/20/20 02/23/21  Wyvonnia Dusky, MD  guaiFENesin (  ROBITUSSIN) 100 MG/5ML SOLN Take 10 mLs (200 mg total) by mouth every 4 (four) hours as needed for cough or to loosen phlegm. 11/21/20   Roxan Hockey, MD  levothyroxine (SYNTHROID) 75 MCG tablet Take 1 tablet (75 mcg total) by mouth daily. 11/20/20 02/23/21  Wyvonnia Dusky, MD  memantine (NAMENDA) 10 MG tablet Take 1 tablet (10 mg total) by mouth 2 (two) times daily. 11/20/20 02/23/21  Wyvonnia Dusky, MD  Multiple Vitamin (MULTIVITAMIN) tablet Take 1 tablet by mouth daily. 11/20/20   Wyvonnia Dusky, MD  pravastatin (PRAVACHOL) 80 MG tablet Take 80 mg by mouth at bedtime. 12/25/20   [provider]    Allergies    Patient has no known allergies.  Review of Systems   Review of  Systems  Unable to perform ROS: Patient unresponsive   Physical Exam Updated Vital Signs BP (!) 74/49    Pulse (!) 107    Resp 18    SpO2 (!) 86%   Physical Exam Vitals and nursing note reviewed.  Constitutional:      Comments: Unresponsive  Eyes:     Comments: 2 mm, fixed pupils  Cardiovascular:     Pulses: Normal pulses.  Pulmonary:     Effort: No respiratory distress.  Neurological:     Comments: GCS 3    ED Results / Procedures / Treatments   Labs (all labs ordered are listed, but only abnormal results are displayed) Labs Reviewed  RESP PANEL BY RT-PCR (FLU A&B, COVID) ARPGX2  CBC WITH DIFFERENTIAL/PLATELET  BLOOD GAS, ARTERIAL    EKG None  Radiology No results found.  Procedures CPR  Date/Time: 2021/03/07 3:42 PM Performed by: Varney Biles, MD Authorized by: Varney Biles, MD  CPR Procedure Details:    ACLS/BLS initiated by EMS: No     CPR/ACLS performed in the ED: Yes     Duration of CPR (minutes):  15   Outcome: ROSC obtained    CPR performed via ACLS guidelines under my direct supervision.  See RN documentation for details including defibrillator use, medications, doses and timing. Comments:     Patient received 3 rounds of epinephrine and 1 amp of bicarb Procedure Name: Intubation Date/Time: 03-07-2021 3:43 PM Performed by: Varney Biles, MD Oxygen Delivery Method: Ambu bag Ventilation: Mask ventilation with difficulty Laryngoscope Size: 3 and Glidescope Grade View: Grade II Tube size: 7.5 mm Number of attempts: 1 Placement Confirmation: ETT inserted through vocal cords under direct vision, Positive ETCO2, CO2 detector and Breath sounds checked- equal and bilateral Secured at: 26 cm Difficulty Due To: Difficulty was anticipated    NG placement  Date/Time: 2021-03-07 3:44 PM Performed by: Varney Biles, MD Authorized by: Varney Biles, MD  Consent: The procedure was performed in an emergent situation. Required items: required  blood products, implants, devices, and special equipment available Time out: Immediately prior to procedure a "time out" was called to verify the correct patient, procedure, equipment, support staff and site/side marked as required. Local anesthesia used: no  Anesthesia: Local anesthesia used: no  Sedation: Patient sedated: no    CPR  Date/Time: 03/07/21 3:44 PM Performed by: Varney Biles, MD Authorized by: Varney Biles, MD  CPR Procedure Details:    CPR/ACLS performed in the ED: Yes     Duration of CPR (minutes):  15   Outcome: ROSC obtained    CPR performed via ACLS guidelines under my direct supervision.  See RN documentation for details including defibrillator use, medications, doses and timing. Comments:  Patient required second round of CPR for about 15 minutes.  3 more rounds of epinephrine given. ROSC achieved. .Critical Care Performed by: Varney Biles, MD Authorized by: Varney Biles, MD   Critical care provider statement:    Critical care time (minutes):  30   Critical care was necessary to treat or prevent imminent or life-threatening deterioration of the following conditions:  Respiratory failure, cardiac failure and circulatory failure   Critical care was time spent personally by me on the following activities:  Development of treatment plan with patient or surrogate, evaluation of patient's response to treatment, examination of patient, ordering and review of laboratory studies, ordering and review of radiographic studies, ordering and performing treatments and interventions, pulse oximetry, re-evaluation of patient's condition, review of old charts and ventilator management   Medications Ordered in ED Medications  EPINEPHrine (ADRENALIN) 1 MG/10ML injection (1 mg Intravenous Given Mar 28, 2021 1345)  EPINEPHrine (ADRENALIN) 1 MG/10ML injection (1 mg Intravenous Given 2021/03/28 1348)  sodium bicarbonate injection (50 mEq Intravenous Given 03/28/2021 1350)   fentaNYL (SUBLIMAZE) injection 50 mcg (has no administration in time range)  fentaNYL (SUBLIMAZE) injection 50 mcg (has no administration in time range)  midazolam (VERSED) injection 1 mg (has no administration in time range)  midazolam (VERSED) injection 1 mg (has no administration in time range)    ED Course  I have reviewed the triage vital signs and the nursing notes.  Pertinent labs & imaging results that were available during my care of the patient were reviewed by me and considered in my medical decision making (see chart for details).    MDM Rules/Calculators/A&P                         88 year old male comes in to the ER unresponsive.  As patient was being changed and assessed by [place], he went into cardiac arrest.  CPR was initiated.  He required intubation we were able to get spontaneous circulation.  There were some mashed potatoes that were being suctioned out of his lungs.  At that time I checked patient's records.  It appears that he had multiple strokes recently including basilar strokes.  I called family, who indicated that they are on the way.  Prior to family arriving, patient went to cardiac arrest again.  Again CPR was initiated.  At that time family informed us that they were comfortable with patient being changed to DNR, DNI.  After the second CPR, there was ROSC again.  However, over time patient started becoming bradycardic.  He was not breathing over the ventilator.  With family's consent, compassionate extubation was completed.  Patient was pronounced deceased at 2:57 PM.      Final Clinical Impression(s) / ED Diagnoses Final diagnoses:  Respiratory arrest ([place])  Aspiration pneumonia due to regurgitated food, unspecified laterality, unspecified part of lung [facility])    Rx / DC Orders ED Discharge Orders     None        Varney Biles, MD 03/28/21 1548    Varney Biles, MD 28-Mar-2021 [phone]

## 2021-03-14 NOTE — ED Notes (Signed)
Per Charise Carwin Supervisor at the bedside, pt is not an ME case and we do not need to call ME.

## 2021-03-14 NOTE — Procedures (Signed)
**Note De-Identified Ghada Abbett Obfuscation** Extubation Procedure Note  Patient Details:   Name: DAMONTAY ALRED DOB: 1929/04/22 MRN: [id number]   Airway Documentation:    Vent end date: 03/15/2021 Vent end time: 1505   Evaluation  O2 sats: transiently fell during during procedure Complications: No apparent complications Patient did tolerate procedure well. Bilateral Breath Sounds: Diminished   No  Ahijah Devery, Penni Bombard 2021-03-15, 3:05 PM

## 2021-03-14 NOTE — ED Notes (Signed)
Triage done under [place], [place]. Tana Coast RN documented initial part of code up to 1358.   1400- rt hand 20g IV started 1401- No pulse, CPR restarted 1402- Epi IV 1405- Epi IV 1407- Bicarb 1 amp IV, No pulse 1409- Epi IV 1410- Lucas started, No pulse 1411- Dr. Kathrynn Humble talking to family, per MD pt is DNR now. 1412- + femoral pulse, BBB, 145/85 1416- OG inserted by Dr. Kathrynn Humble 1422- PEA, no pulse 1423- Dr. Kathrynn Humble talking to family 71- family now at the bedside.

## 2021-03-14 NOTE — ED Notes (Signed)
Dr. Kathrynn Humble pronounced time of death 63, no cardiac rhythm on the monitor, no pulse.

## 2021-03-14 NOTE — ED Notes (Signed)
Pulse noted by Dr Kathrynn Humble. Compressions stopped at this time.

## 2021-03-14 DEATH — deceased

## 2021-06-22 ENCOUNTER — Ambulatory Visit: Payer: Medicare Other | Admitting: Adult Health

## 2021-09-18 IMAGING — MR MR HEAD W/O CM
12 series · 48 of 48 positions shown · non-contrast
Comparison: Head CT 11/17/2020 and MRI 11/08/2020

CLINICAL DATA: Generalized weakness, unable to walk, confusion,
recent CVA received IV TPA.

EXAM:
MRI HEAD WITHOUT CONTRAST
MRA HEAD WITHOUT CONTRAST
TECHNIQUE: Multiplanar, multi-echo pulse sequences of the brain and surrounding
structures were acquired without intravenous contrast. Angiographic
images of the Circle of Willis were acquired using MRA technique
without intravenous contrast.

[Series 9: DWI · axial · 3.0mm · 0.77mm/px · z∈[-76,+57]mm · 3 of 50 slices shown (1 of 4)]
[im 1/50]
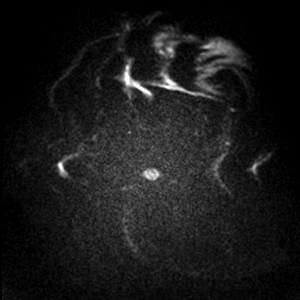
[im 25/50]
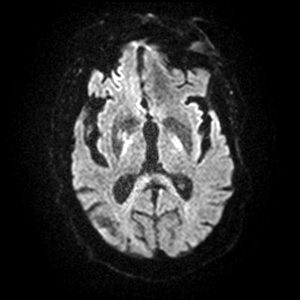
[im 50/50]
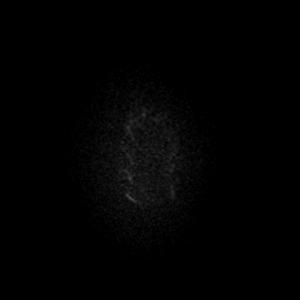

[Series 10: DWI · axial · 3.0mm · 0.77mm/px · z∈[-76,+57]mm · 4 of 50 slices shown (2 of 4)]
[im 1/50]
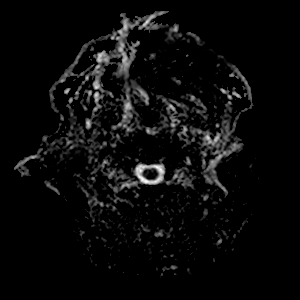
[im 17/50]
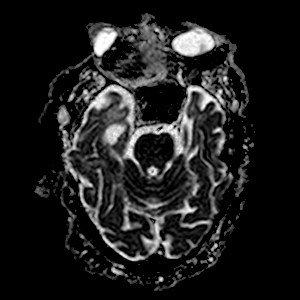
[im 33/50]
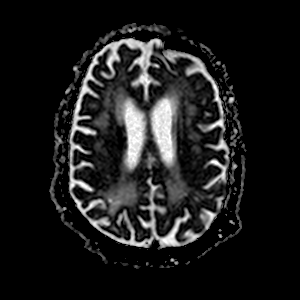
[im 50/50]
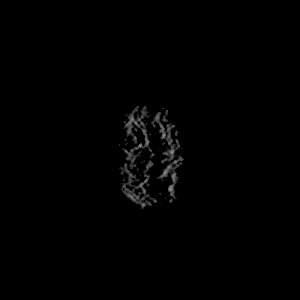

[Series 11: DWI · coronal · 5.0mm · 0.88mm/px · 2 of 28 slices shown (3 of 4)]
[im 1/28]
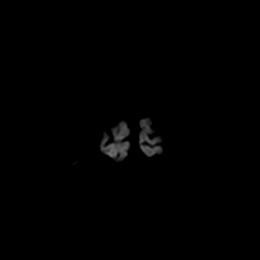
[im 28/28]
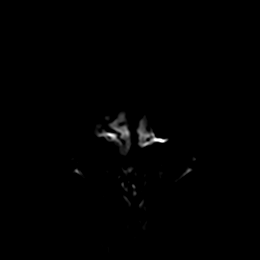

[Series 12: DWI · coronal · 5.0mm · 0.88mm/px · 2 of 28 slices shown (4 of 4)]
[im 1/28]
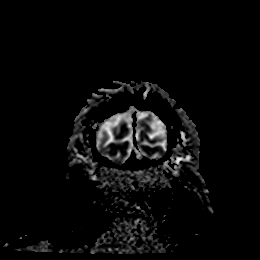
[im 28/28]
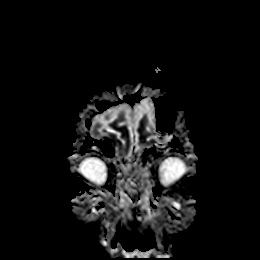

[Series 18: T1 · sagittal · 5.0mm · 0.75mm/px · 2 of 21 slices shown (1 of 2)]
[im 1/21]
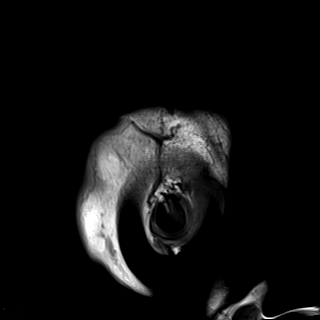
[im 21/21]
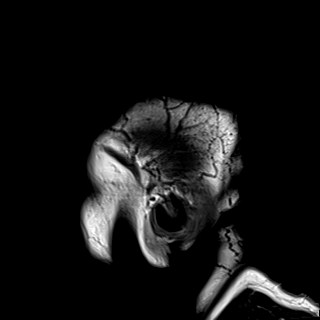

[Series 19: T2 · axial · 5.0mm · 0.72mm/px · z∈[-79,+60]mm · 2 of 23 slices shown (1 of 2)]
[im 1/23]
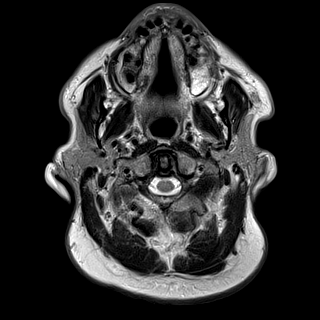
[im 23/23]
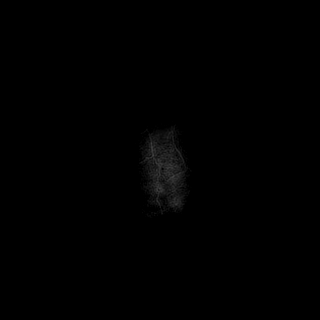

[Series 20: mag_images · axial · 3.0mm · 0.90mm/px · z∈[-90,+70]mm · 5 of 60 slices shown]
[im 1/60]
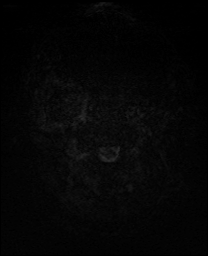
[im 15/60]
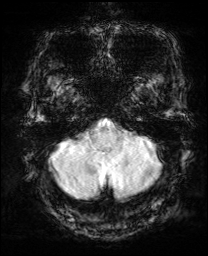
[im 30/60]
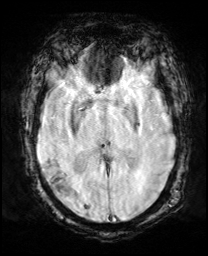
[im 45/60]
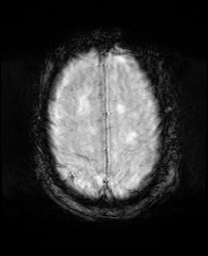
[im 60/60]
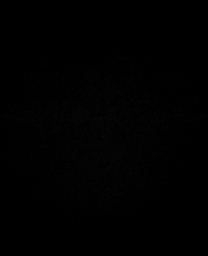

[Series 21: pha_images · axial · 3.0mm · 0.90mm/px · z∈[-85,+67]mm · 4 of 55 slices shown]
[im 1/55]
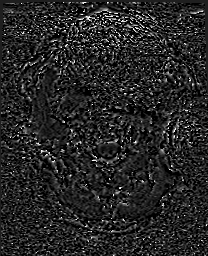
[im 19/55]
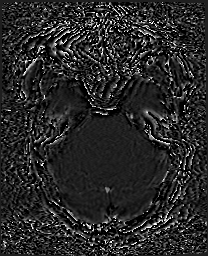
[im 37/55]
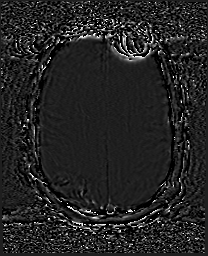
[im 55/55]
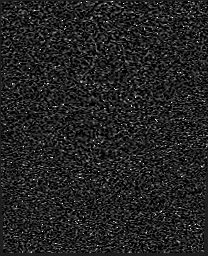

[Series 22: swi_images · axial · 3.0mm · 0.90mm/px · z∈[-90,+70]mm · 5 of 60 slices shown]
[im 1/60]
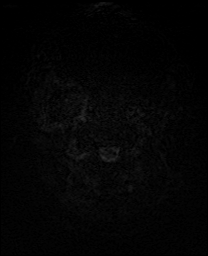
[im 15/60]
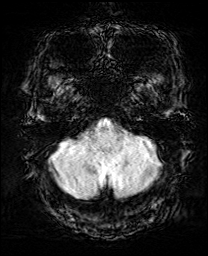
[im 30/60]
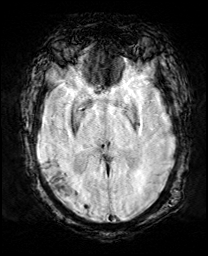
[im 45/60]
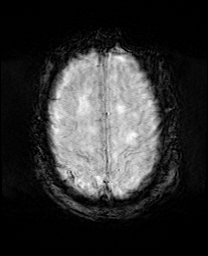
[im 60/60]
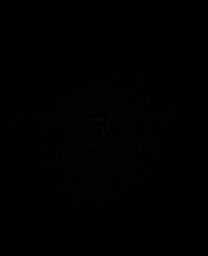

[Series 24: FLAIR · axial · 3.0mm · 0.45mm/px · z∈[-77,+56]mm · 4 of 50 slices shown]
[im 1/50]
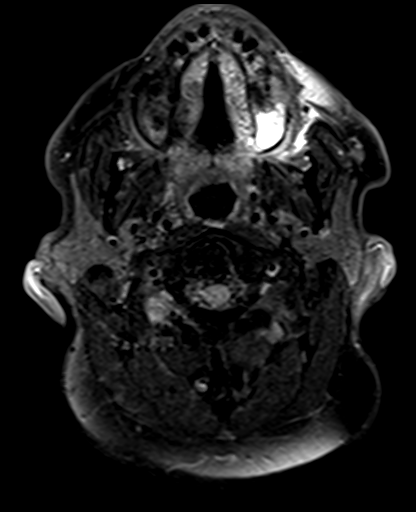
[im 17/50]
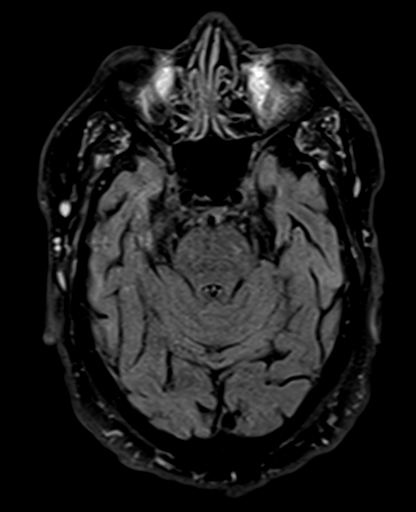
[im 33/50]
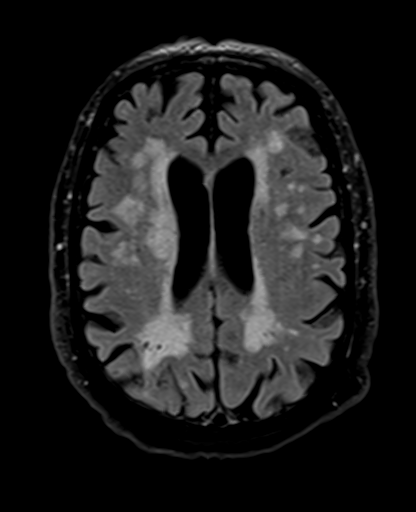
[im 50/50]
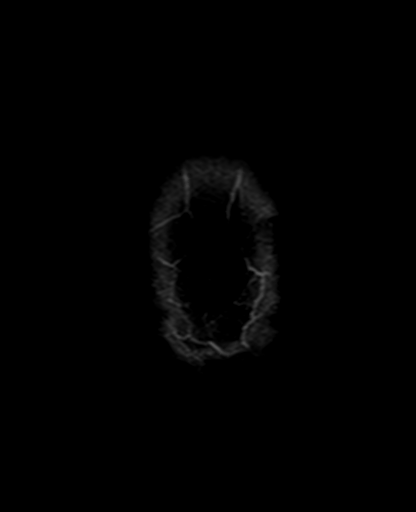

[Series 25: T1 · axial · 1.0mm · 0.98mm/px · z∈[-85,+73]mm · 13 of 176 slices shown (2 of 2)]
[im 1/176]
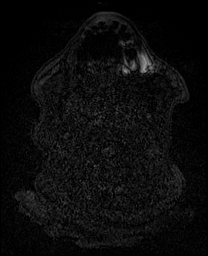
[im 15/176]
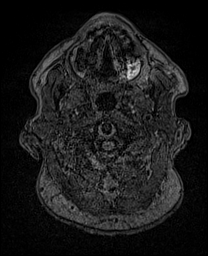
[im 30/176]
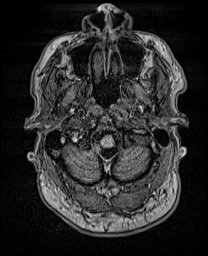
[im 44/176]
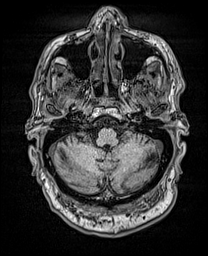
[im 59/176]
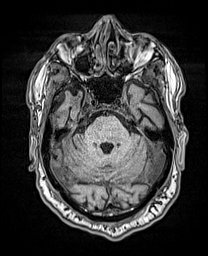
[im 73/176]
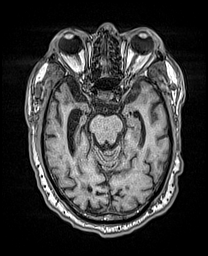
[im 88/176]
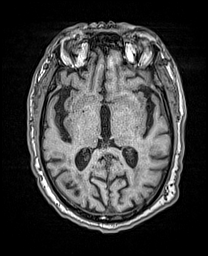
[im 103/176]
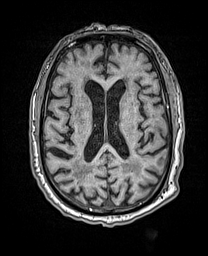
[im 117/176]
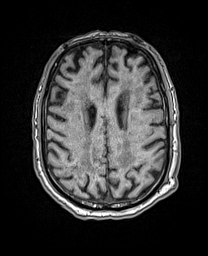
[im 132/176]
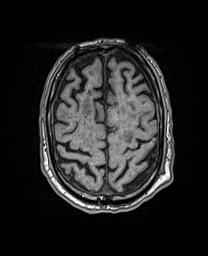
[im 146/176]
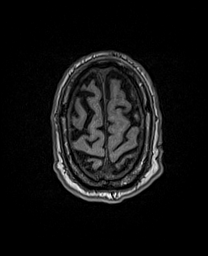
[im 161/176]
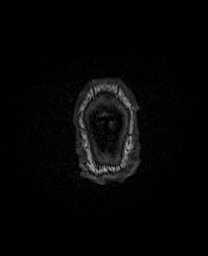
[im 176/176]
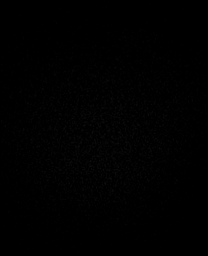

[Series 26: T2 · coronal · 5.0mm · 0.72mm/px · 2 of 28 slices shown (2 of 2)]
[im 1/28]
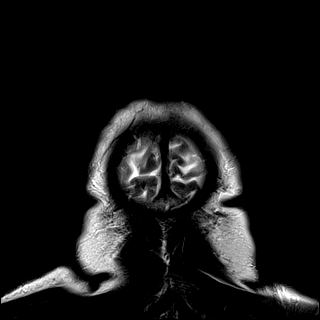
[im 28/28]
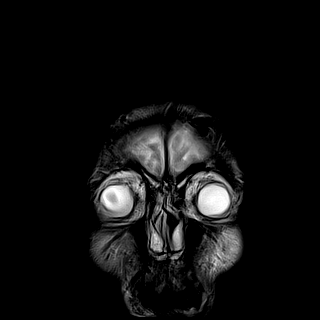

[48 of 48 positions shown; findings below may reference images not displayed]

FINDINGS: MRI HEAD FINDINGS

Brain: There is restricted diffusion involving the right basal
ganglia (caudate body and lentiform nucleus) and corona radiata
which is in the same location as the acute infarct on the prior MRI,
however the extent of the diffusion abnormality today is greater
than on the prior MRI with more intensely restricted diffusion
consistent with recurrent acute ischemia. Superficial siderosis is
again seen over the posterior right cerebral convexity consistent
with remote subarachnoid hemorrhage with some chronic parenchymal
microhemorrhages also again noted in this region. Patchy and
confluent T2 hyperintensities in the cerebral white matter
bilaterally are unchanged and nonspecific but compatible with severe
chronic small vessel ischemic disease. Asymmetrically advanced
volume loss is again noted in the mesial right temporal lobe. No
mass, midline shift, or extra-axial fluid collection is identified.

Vascular: Major intracranial vascular flow voids are preserved.

Skull and upper cervical spine: Unremarkable bone marrow signal.

Sinuses/Orbits: Unremarkable orbits. Mild mucosal thickening in the
frontal and ethmoid sinuses bilaterally. Clear mastoid air cells.

Other: None.

MRA HEAD FINDINGS

Anterior circulation: The internal carotid arteries are patent from
skull base to carotid termini with at most mild supraclinoid
stenosis bilaterally. ACAs and MCAs are patent without evidence of a
proximal branch occlusion or significant proximal stenosis. No
aneurysm is identified.

Posterior circulation: The intracranial vertebral arteries are
widely patent to the basilar and codominant. Patent PICA and SCA
origins are seen bilaterally. The basilar artery is widely patent.
Posterior communicating arteries are diminutive or absent. Both PCAs
are patent without evidence of a significant proximal stenosis. No
aneurysm is identified.

Anatomic variants: None.
IMPRESSION: 1. Recurrent acute ischemia in the right basal ganglia.
2. Severe chronic small vessel ischemic disease.
3. No major intracranial arterial occlusion or high-grade proximal
stenosis.

## 2021-09-18 IMAGING — MR MR MRA HEAD W/O CM
1 series · 18 of 48 positions shown · non-contrast
Comparison: Head CT 11/17/2020 and MRI 11/08/2020

CLINICAL DATA: Generalized weakness, unable to walk, confusion,
recent CVA received IV TPA.

EXAM:
MRI HEAD WITHOUT CONTRAST
MRA HEAD WITHOUT CONTRAST
TECHNIQUE: Multiplanar, multi-echo pulse sequences of the brain and surrounding
structures were acquired without intravenous contrast. Angiographic
images of the Circle of Willis were acquired using MRA technique
without intravenous contrast.

[Series 100: TOF · axial · 0.4mm · 0.43mm/px · z∈[-69,+11]mm · 18 of 232 slices shown]
[im 1/232]
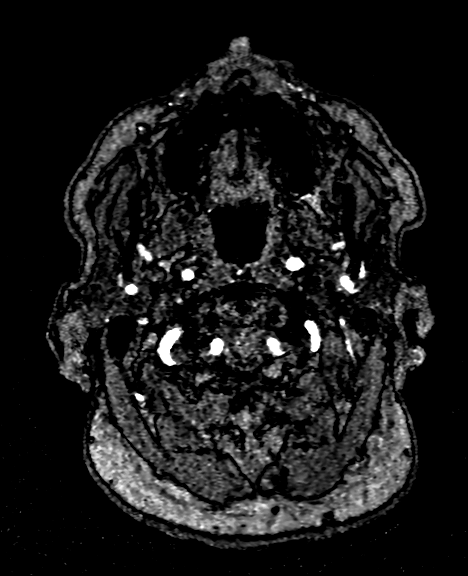
[im 5/232]
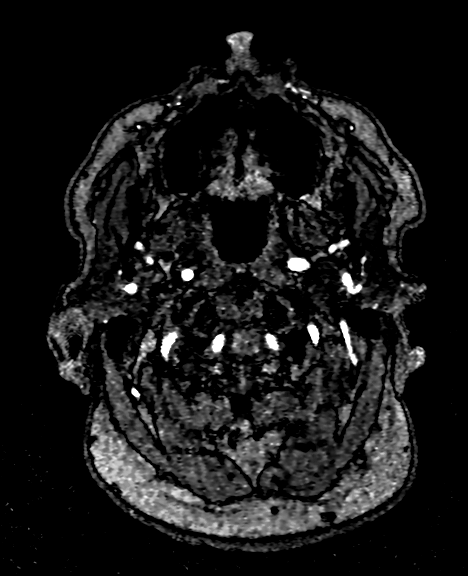
[im 10/232]
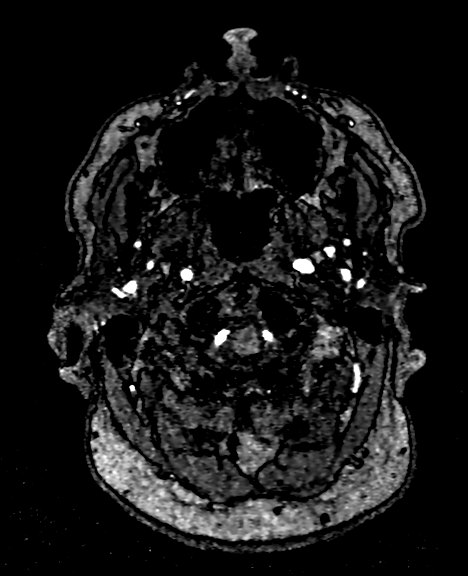
[im 15/232]
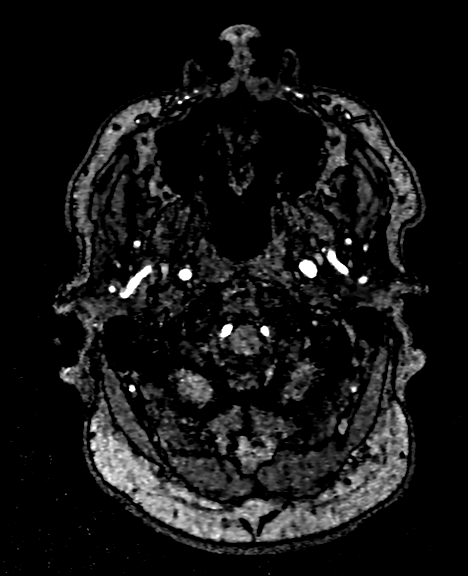
[im 20/232]
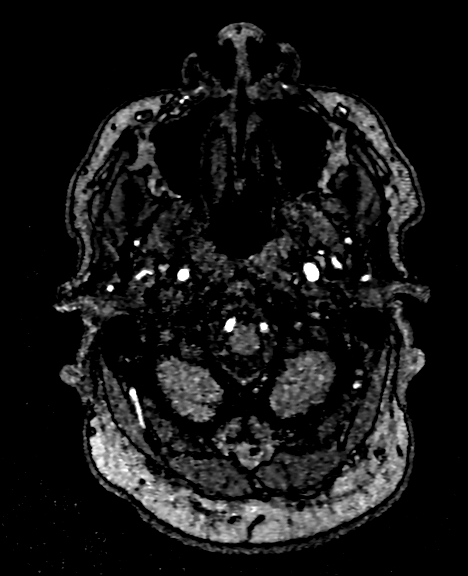
[im 25/232]
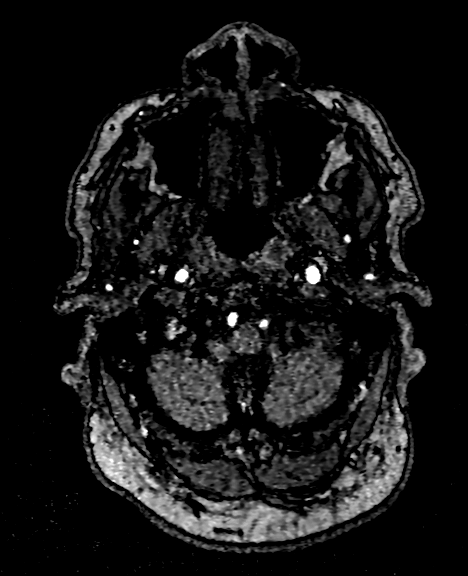
[im 30/232]
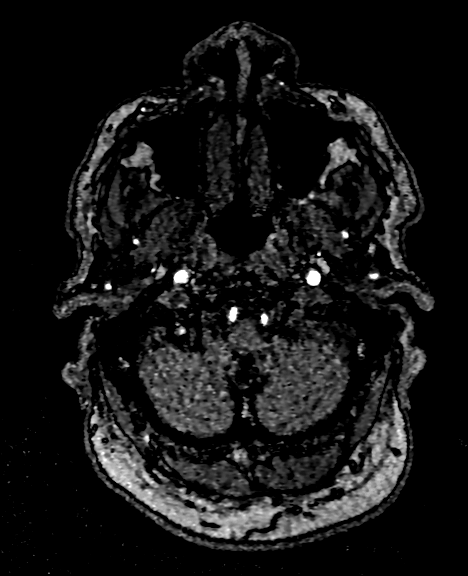
[im 35/232]
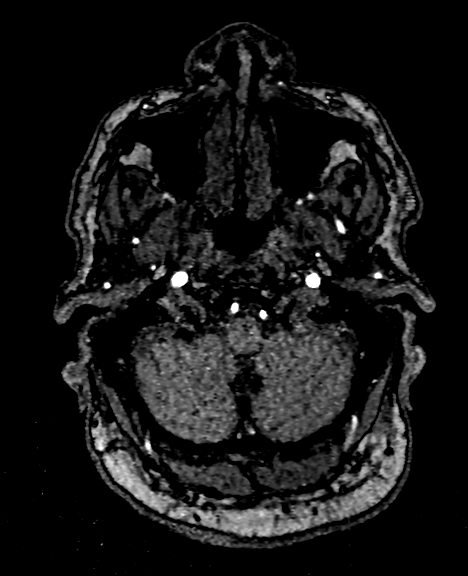
[im 40/232]
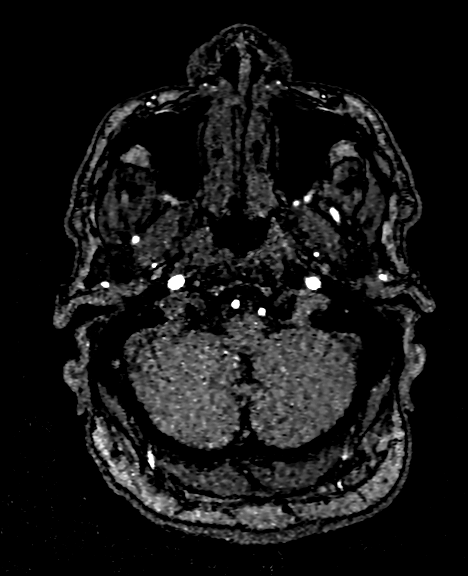
[im 45/232]
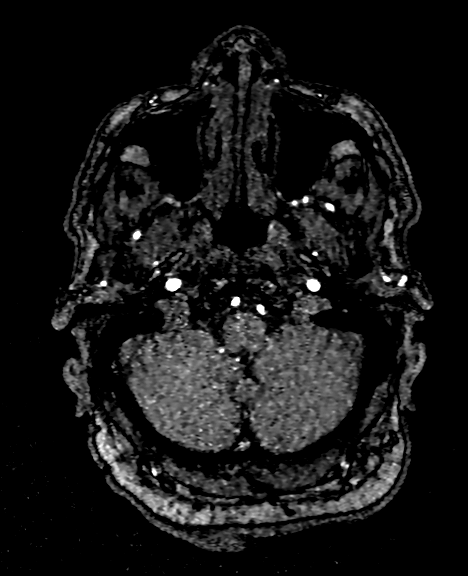
[im 74/232]
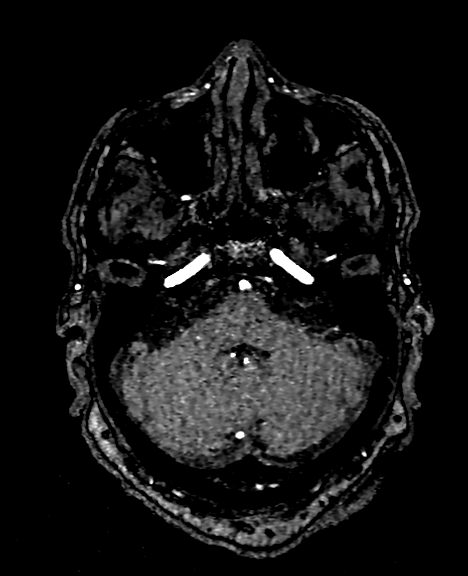
[im 104/232]
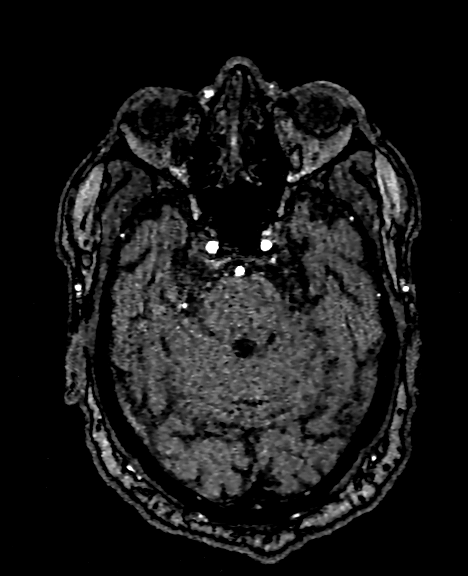
[im 118/232]
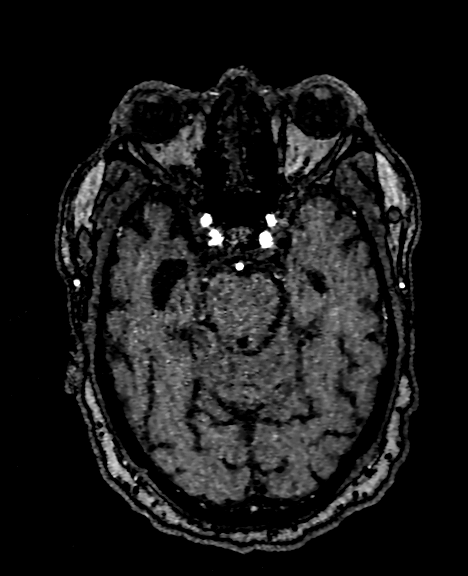
[im 133/232]
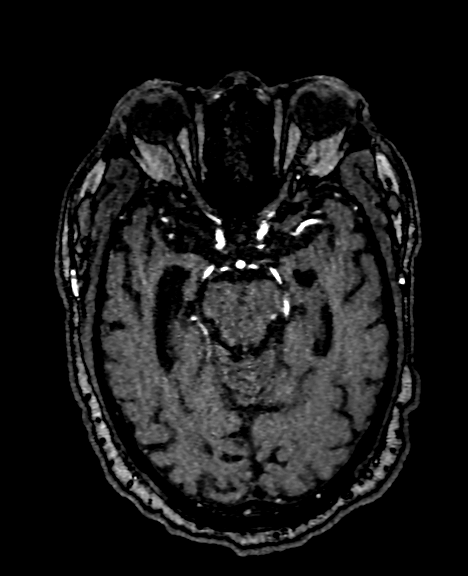
[im 163/232]
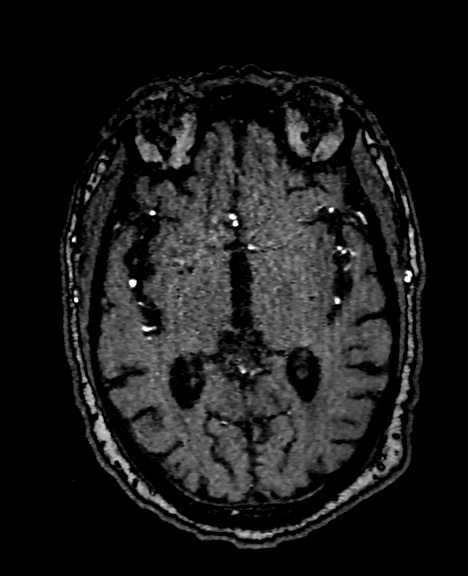
[im 192/232]
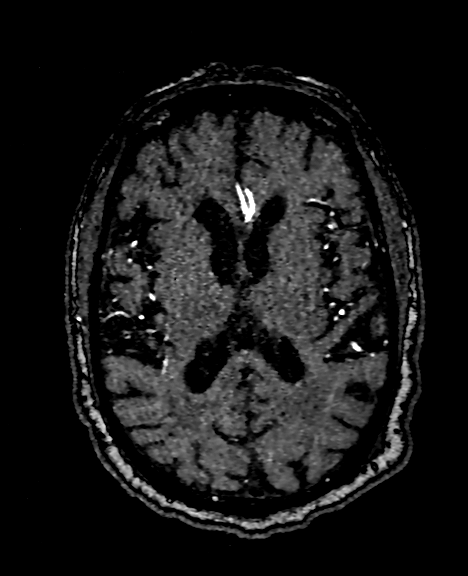
[im 197/232]
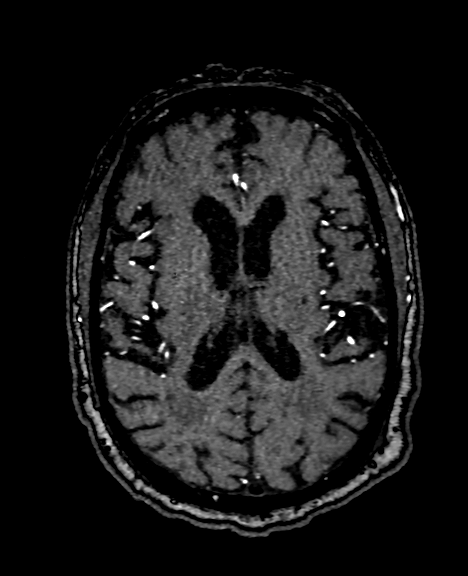
[im 222/232]
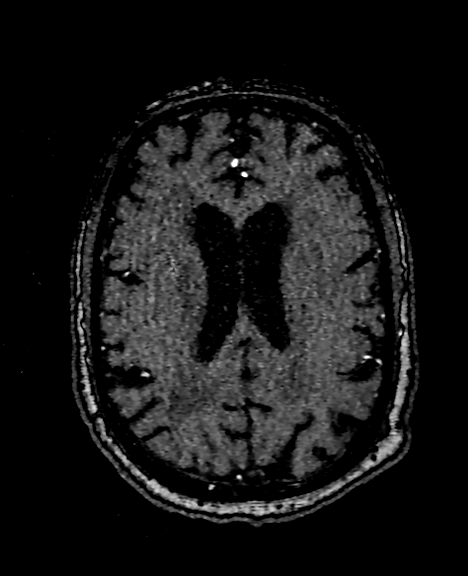

[18 of 48 positions shown; findings below may reference images not displayed]

FINDINGS: MRI HEAD FINDINGS

Brain: There is restricted diffusion involving the right basal
ganglia (caudate body and lentiform nucleus) and corona radiata
which is in the same location as the acute infarct on the prior MRI,
however the extent of the diffusion abnormality today is greater
than on the prior MRI with more intensely restricted diffusion
consistent with recurrent acute ischemia. Superficial siderosis is
again seen over the posterior right cerebral convexity consistent
with remote subarachnoid hemorrhage with some chronic parenchymal
microhemorrhages also again noted in this region. Patchy and
confluent T2 hyperintensities in the cerebral white matter
bilaterally are unchanged and nonspecific but compatible with severe
chronic small vessel ischemic disease. Asymmetrically advanced
volume loss is again noted in the mesial right temporal lobe. No
mass, midline shift, or extra-axial fluid collection is identified.

Vascular: Major intracranial vascular flow voids are preserved.

Skull and upper cervical spine: Unremarkable bone marrow signal.

Sinuses/Orbits: Unremarkable orbits. Mild mucosal thickening in the
frontal and ethmoid sinuses bilaterally. Clear mastoid air cells.

Other: None.

MRA HEAD FINDINGS

Anterior circulation: The internal carotid arteries are patent from
skull base to carotid termini with at most mild supraclinoid
stenosis bilaterally. ACAs and MCAs are patent without evidence of a
proximal branch occlusion or significant proximal stenosis. No
aneurysm is identified.

Posterior circulation: The intracranial vertebral arteries are
widely patent to the basilar and codominant. Patent PICA and SCA
origins are seen bilaterally. The basilar artery is widely patent.
Posterior communicating arteries are diminutive or absent. Both PCAs
are patent without evidence of a significant proximal stenosis. No
aneurysm is identified.

Anatomic variants: None.
IMPRESSION: 1. Recurrent acute ischemia in the right basal ganglia.
2. Severe chronic small vessel ischemic disease.
3. No major intracranial arterial occlusion or high-grade proximal
stenosis.
